# Patient Record
Sex: Female | Born: 1985 | Race: White | Hispanic: No | Smoking: Never smoker
Health system: Southern US, Community
[De-identification: ages and names within clinical notes are randomized; demographics above are authoritative.]

## PROBLEM LIST (undated history)

## (undated) DIAGNOSIS — B009 Herpesviral infection, unspecified: Secondary | ICD-10-CM

## (undated) DIAGNOSIS — M549 Dorsalgia, unspecified: Secondary | ICD-10-CM

## (undated) DIAGNOSIS — R600 Localized edema: Secondary | ICD-10-CM

## (undated) DIAGNOSIS — R413 Other amnesia: Secondary | ICD-10-CM

## (undated) DIAGNOSIS — K589 Irritable bowel syndrome without diarrhea: Secondary | ICD-10-CM

## (undated) DIAGNOSIS — E079 Disorder of thyroid, unspecified: Secondary | ICD-10-CM

## (undated) DIAGNOSIS — R519 Headache, unspecified: Secondary | ICD-10-CM

## (undated) DIAGNOSIS — Z789 Other specified health status: Secondary | ICD-10-CM

## (undated) DIAGNOSIS — K5900 Constipation, unspecified: Secondary | ICD-10-CM

## (undated) DIAGNOSIS — R51 Headache: Secondary | ICD-10-CM

## (undated) HISTORY — DX: Localized edema: R60.0

## (undated) HISTORY — DX: Dorsalgia, unspecified: M54.9

## (undated) HISTORY — DX: Other amnesia: R41.3

## (undated) HISTORY — PX: EYE SURGERY: SHX253

## (undated) HISTORY — DX: Disorder of thyroid, unspecified: E07.9

## (undated) HISTORY — DX: Irritable bowel syndrome, unspecified: K58.9

## (undated) HISTORY — DX: Constipation, unspecified: K59.00

## (undated) HISTORY — DX: Headache: R51

## (undated) HISTORY — DX: Headache, unspecified: R51.9

---

## 2011-07-10 ENCOUNTER — Other Ambulatory Visit (HOSPITAL_COMMUNITY)
Admission: RE | Admit: 2011-07-10 | Discharge: 2011-07-10 | Disposition: A | Discharge status: Home / Self Care | Payer: Medicaid Other | Source: Ambulatory Visit | Attending: Obstetrics and Gynecology | Admitting: Obstetrics and Gynecology

## 2011-07-10 ENCOUNTER — Other Ambulatory Visit: Payer: Self-pay | Admitting: Adult Health

## 2011-07-10 DIAGNOSIS — Z01419 Encounter for gynecological examination (general) (routine) without abnormal findings: Secondary | ICD-10-CM | POA: Insufficient documentation

## 2011-07-10 DIAGNOSIS — Z113 Encounter for screening for infections with a predominantly sexual mode of transmission: Secondary | ICD-10-CM | POA: Insufficient documentation

## 2011-12-03 ENCOUNTER — Inpatient Hospital Stay (HOSPITAL_COMMUNITY)
Admission: AD | Admit: 2011-12-03 | Discharge: 2011-12-03 | Disposition: A | Discharge status: Home / Self Care | Payer: Medicaid Other | Source: Ambulatory Visit | Attending: Obstetrics & Gynecology | Admitting: Obstetrics & Gynecology

## 2011-12-03 ENCOUNTER — Encounter (HOSPITAL_COMMUNITY): Payer: Self-pay | Admitting: *Deleted

## 2011-12-03 DIAGNOSIS — O285 Abnormal chromosomal and genetic finding on antenatal screening of mother: Secondary | ICD-10-CM | POA: Insufficient documentation

## 2011-12-03 DIAGNOSIS — O99891 Other specified diseases and conditions complicating pregnancy: Secondary | ICD-10-CM | POA: Insufficient documentation

## 2011-12-03 DIAGNOSIS — O0990 Supervision of high risk pregnancy, unspecified, unspecified trimester: Secondary | ICD-10-CM | POA: Insufficient documentation

## 2011-12-03 DIAGNOSIS — O09899 Supervision of other high risk pregnancies, unspecified trimester: Secondary | ICD-10-CM | POA: Insufficient documentation

## 2011-12-03 DIAGNOSIS — O26899 Other specified pregnancy related conditions, unspecified trimester: Secondary | ICD-10-CM

## 2011-12-03 DIAGNOSIS — R109 Unspecified abdominal pain: Secondary | ICD-10-CM | POA: Insufficient documentation

## 2011-12-03 DIAGNOSIS — O479 False labor, unspecified: Secondary | ICD-10-CM

## 2011-12-03 DIAGNOSIS — B009 Herpesviral infection, unspecified: Secondary | ICD-10-CM | POA: Insufficient documentation

## 2011-12-03 HISTORY — DX: Other specified health status: Z78.9

## 2011-12-03 LAB — URINALYSIS, ROUTINE W REFLEX MICROSCOPIC
Glucose, UA: NEGATIVE mg/dL
Hgb urine dipstick: NEGATIVE
Ketones, ur: NEGATIVE mg/dL
Leukocytes, UA: NEGATIVE
pH: 6 (ref 5.0–8.0)

## 2011-12-03 LAB — FETAL FIBRONECTIN: Fetal Fibronectin: POSITIVE — AB

## 2011-12-03 NOTE — Progress Notes (Signed)
Pt states," I've had pain in my low abdomen and vagina for two weeks;  mostly vagina like the baby is pushing on my bladder. It hurst to walk and standing."

## 2011-12-03 NOTE — Progress Notes (Signed)
Pt in c/o sharp, stabbing pains in lower abdomen and in vagina.  States pain has been off an on x2 weeks, progressively getting worse.  Denies any bleeding or leaking of fluid. Denies any contractions.  + FM.

## 2011-12-03 NOTE — ED Provider Notes (Signed)
History     Chief Complaint  Patient presents with  . Abdominal Pain   HPI 26 y/o F G2P1001 with 28.4 weeks presenting with abdominal pain for 2 weeks that irradiates to her vagina. No vaginal discharge, leaking of fluid or bleeding. She is Pt of [facility] and has received prenatal care including labs and U/S. She does not reports hx of prematurity in her prior pregnancy, also no medical problems that she is aware of during this pregnancy. No headache, visual changes, epigastric pain or edema. No other complaints. Labs up to this visit: O positive.                                   HIV, Serology, HBs Ag, negative.                                   Rubella/ Varicella immune.                                  MSAFP increased risk for open NTD. Harmony test negative.                                  OB U/S: 2 vesel cord. Minimal bilateral hydronephrosis noted. Abnormal u/s repeat at 28 weeks.   OB History    Grav Para Term Preterm Abortions TAB SAB Ect Mult Living   2 1 1  0 0 0 0 0 0 1      Past Medical History  Diagnosis Date  . No pertinent past medical history     Past Surgical History  Procedure Date  . No past surgeries     Family History  Problem Relation Age of Onset  . Anesthesia problems Neg Hx     History  Substance Use Topics  . Smoking status: Never Smoker   . Smokeless tobacco: Not on file  . Alcohol Use: No    Allergies: No Known Allergies  Prescriptions prior to admission  Medication Sig Dispense Refill  . Prenatal Vit-Fe Fumarate-FA (PRENATAL MULTIVITAMIN) TABS Take 1 tablet by mouth daily.        Review of Systems  Constitutional: Negative for fever.  Eyes: Negative for blurred vision.  Respiratory: Negative for cough.   Cardiovascular: Negative for chest pain.  Gastrointestinal: Positive for abdominal pain. Negative for heartburn, nausea, vomiting and diarrhea.  Genitourinary: Negative.   Musculoskeletal: Negative.   Neurological:  Negative for headaches.   Physical Exam   Blood pressure 114/68, pulse 112, temperature 98.4 F (36.9 C), temperature source Oral, resp. rate 18, height 5' 4.75" (1.645 m), weight 104.327 kg (230 lb).  Physical Exam  Constitutional: She is oriented to person, place, and time. She appears well-developed and well-nourished. No distress.  HENT:  Mouth/Throat: Oropharynx is clear and moist.  Eyes: Conjunctivae and EOM are normal. Pupils are equal, round, and reactive to light.  Neck: Neck supple.  Cardiovascular: Normal rate, regular rhythm, normal heart sounds and intact distal pulses.   No murmur heard. Respiratory: Breath sounds normal. She is in respiratory distress.  GI: Soft. Bowel sounds are normal.       Normal abdominal physical exam of a 28 gravid pt.   Genitourinary:  Speculum: Normal thin vaginal discharge. Normal cervix. Bimanual: Cervix anterior, internal OS closed, long and soft 40% effaced. Station -3-4.  Musculoskeletal: She exhibits no edema.  Neurological: She is alert and oriented to person, place, and time. She has normal reflexes.  Skin: Skin is warm and dry.    MAU Course  Procedures  MDM Fetal fibronectin: positive FHT: category 1 Toco: 1 contraction in 30 min Assessment and Plan  26 y/o F G2P1001 with 28.4 weeks presenting with abdominal pain. No patterned contractions, Closed cervix rechecked  1 h later by Katrinka Blazing, CNM and no changes found. Case discussed with Dr. Marice Potter. Recommended f/u at [facility] tomorrow. Preterm labor signs and symptoms discussed. Pt agreeable with plan.  D. [place] Sherron Flemings Paz. MD PGY-1 12/03/2011, 6:19 PM

## 2011-12-05 LAB — URINE CULTURE
Colony Count: 50000
Culture  Setup Time: 201302060141
Special Requests: NORMAL

## 2011-12-13 ENCOUNTER — Inpatient Hospital Stay (HOSPITAL_COMMUNITY)
Admission: AD | Admit: 2011-12-13 | Discharge: 2011-12-14 | DRG: 778 | Disposition: A | Discharge status: Home / Self Care | Payer: Medicaid Other | Source: Ambulatory Visit | Attending: Obstetrics & Gynecology | Admitting: Obstetrics & Gynecology

## 2011-12-13 ENCOUNTER — Encounter (HOSPITAL_COMMUNITY): Payer: Self-pay | Admitting: *Deleted

## 2011-12-13 ENCOUNTER — Inpatient Hospital Stay (HOSPITAL_COMMUNITY): Payer: Medicaid Other

## 2011-12-13 DIAGNOSIS — O4700 False labor before 37 completed weeks of gestation, unspecified trimester: Principal | ICD-10-CM | POA: Diagnosis present

## 2011-12-13 HISTORY — DX: Herpesviral infection, unspecified: B00.9

## 2011-12-13 LAB — CBC
HCT: 39.8 % (ref 36.0–46.0)
Hemoglobin: 13 g/dL (ref 12.0–15.0)
MCH: 28.5 pg (ref 26.0–34.0)
MCV: 87.3 fL (ref 78.0–100.0)
RBC: 4.56 MIL/uL (ref 3.87–5.11)
WBC: 12.7 10*3/uL — ABNORMAL HIGH (ref 4.0–10.5)

## 2011-12-13 LAB — URINALYSIS, ROUTINE W REFLEX MICROSCOPIC
Glucose, UA: NEGATIVE mg/dL
Leukocytes, UA: NEGATIVE
Nitrite: NEGATIVE
Protein, ur: NEGATIVE mg/dL
Urobilinogen, UA: 0.2 mg/dL (ref 0.0–1.0)

## 2011-12-13 MED ORDER — IBUPROFEN 600 MG PO TABS
600.0000 mg | ORAL_TABLET | Freq: Once | ORAL | Status: AC
  Administered 2011-12-13: 600 mg via ORAL
  Filled 2011-12-13: qty 1

## 2011-12-13 MED ORDER — ZOLPIDEM TARTRATE 10 MG PO TABS: 10.0000 mg | ORAL_TABLET | Freq: Every evening | ORAL | Status: DC | PRN

## 2011-12-13 MED ORDER — MAGNESIUM SULFATE 40 G IN LACTATED RINGERS - SIMPLE
2.0000 g/h | INTRAVENOUS | Status: DC
  Administered 2011-12-13: 2 g/h via INTRAVENOUS
  Filled 2011-12-13: qty 500

## 2011-12-13 MED ORDER — ACETAMINOPHEN 325 MG PO TABS: 650.0000 mg | ORAL_TABLET | ORAL | Status: DC | PRN

## 2011-12-13 MED ORDER — MAGNESIUM SULFATE BOLUS VIA INFUSION
4.0000 g | Freq: Once | INTRAVENOUS | Status: AC
  Administered 2011-12-13: 4 g via INTRAVENOUS
  Filled 2011-12-13: qty 500

## 2011-12-13 MED ORDER — DOCUSATE SODIUM 100 MG PO CAPS
100.0000 mg | ORAL_CAPSULE | Freq: Every day | ORAL | Status: DC
  Administered 2011-12-14: 100 mg via ORAL
  Filled 2011-12-13 (×2): qty 1

## 2011-12-13 MED ORDER — PRENATAL MULTIVITAMIN CH
1.0000 | ORAL_TABLET | Freq: Every day | ORAL | Status: DC
  Administered 2011-12-14: 1 via ORAL

## 2011-12-13 MED ORDER — CALCIUM CARBONATE ANTACID 500 MG PO CHEW: 2.0000 | CHEWABLE_TABLET | ORAL | Status: DC | PRN

## 2011-12-13 MED ORDER — LACTATED RINGERS IV SOLN
INTRAVENOUS | Status: DC
  Administered 2011-12-13: 20 mL/h via INTRAVENOUS
  Administered 2011-12-14: 06:00:00 via INTRAVENOUS

## 2011-12-13 NOTE — ED Notes (Signed)
States received steroid injections from office, 2/6 and 2/7.

## 2011-12-13 NOTE — Progress Notes (Signed)
Pt states, " I've had contractions since 8 am. I saw Dr. Emelda Fear at 10 am, and he gave me procardia. I took 20 mg at 1256 and 10 mg at 1356. They have calmed down some, but I'm still having them."

## 2011-12-13 NOTE — ED Provider Notes (Signed)
History     Chief Complaint  Patient presents with  . Contractions   HPI Pt is a 26 yo G44P1001 female who presents for contractions. Pt received steroids last week at Dr. Rayna Sexton office.  Pt seen today at [facility] by Dr. Emelda Fear.  Cervix was closed and long.  Pt was given procardia 10mg  which she has taken but still feels cramping.  No LOF, VB.  Good FM.  Pt's U cx from admission showed multiple species and is being recollected today for culture.   Past Medical History  Diagnosis Date  . Herpes     Past Surgical History  Procedure Date  . No past surgeries     Family History  Problem Relation Age of Onset  . Anesthesia problems Neg Hx     History  Substance Use Topics  . Smoking status: Never Smoker   . Smokeless tobacco: Never Used  . Alcohol Use: No    Allergies: No Known Allergies  Prescriptions prior to admission  Medication Sig Dispense Refill  . NIFEdipine (PROCARDIA) 10 MG capsule Take 10 mg by mouth 3 (three) times daily.      . Prenatal Vit-Fe Fumarate-FA (PRENATAL MULTIVITAMIN) TABS Take 1 tablet by mouth daily.        Review of Systems  Constitutional: Negative.   Gastrointestinal: Positive for abdominal pain.  Genitourinary: Negative for dysuria, urgency and frequency.  Neurological: Positive for headaches.   Physical Exam   Blood pressure 107/71, pulse 112, temperature 97 F (36.1 C), temperature source Oral, resp. rate 18, height 5' 5.25" (1.657 m), weight 104.497 kg (230 lb 6 oz).  Physical Exam  Vitals reviewed. Constitutional: She is oriented to person, place, and time. She appears well-developed and well-nourished. No distress.  HENT:  Head: Normocephalic and atraumatic.  Eyes: Conjunctivae are normal.  Respiratory: Effort normal.  GI: Soft. She exhibits no distension. There is no tenderness. There is no rebound and no guarding.  Genitourinary: Guaiac stool: Cerviacl internal os is closed/ external os is 1 cm.  Cervix is long with  tone.  -3 station.  Neurological: She is oriented to person, place, and time.  Skin: Skin is warm and dry.  Psychiatric: She has a normal mood and affect.    MAU Course  Procedures NST reactive.  Baseline 155 to 160.   Assessment and Plan  25  G2P1001 at 30 weeks and 0 days with preterm contractions.  Pt made cervical change during the 1 1/2 hours she was here.  She is now 1 and long.  She is having contractions every 5 minutes.  Will admit for tocolysis and observation.  Pt has history of 2 vessel cord so will get growth [place].  Will treat migraine with ibuprofen / flexeril.    Leah Schneider H. 12/13/2011, 5:56 PM

## 2011-12-13 NOTE — ED Notes (Signed)
States UC's have decreased since Procardia.

## 2011-12-13 NOTE — Progress Notes (Signed)
In to start IV and discuss going to u/s and then to antenatal. Significant other at bedside.

## 2011-12-13 NOTE — ED Notes (Signed)
Kim RN in antenatal given report of pt's admission and status. Will go to u/s first and then to antenatal

## 2011-12-13 NOTE — Progress Notes (Signed)
To u/s via w/c and then 155

## 2011-12-13 NOTE — Progress Notes (Signed)
To u/s via w/c and then to 155

## 2011-12-13 NOTE — H&P (Signed)
  Chief Complaint   Patient presents with   .  Contractions    HPI  Pt is a 26 yo G47P1001 female who presents for contractions. Pt received steroids last week at Dr. Rayna Sexton office. Pt seen today at [facility] by Dr. Emelda Fear. Cervix was closed and long. Pt was given procardia 10mg  which she has taken but still feels cramping. No LOF, VB. Good FM. Pt's U cx from admission showed multiple species and is being recollected today for culture.  Past Medical History   Diagnosis  Date   .  Herpes     Past Surgical History   Procedure  Date   .  No past surgeries     Family History   Problem  Relation  Age of Onset   .  Anesthesia problems  Neg Hx     History   Substance Use Topics   .  Smoking status:  Never Smoker   .  Smokeless tobacco:  Never Used   .  Alcohol Use:  No    Allergies: No Known Allergies  Prescriptions prior to admission   Medication  Sig  Dispense  Refill   .  NIFEdipine (PROCARDIA) 10 MG capsule  Take 10 mg by mouth 3 (three) times daily.     .  Prenatal Vit-Fe Fumarate-FA (PRENATAL MULTIVITAMIN) TABS  Take 1 tablet by mouth daily.      Review of Systems  Constitutional: Negative.  Gastrointestinal: Positive for abdominal pain.  Genitourinary: Negative for dysuria, urgency and frequency.  Neurological: Positive for headaches.   Physical Exam   Blood pressure 107/71, pulse 112, temperature 97 F (36.1 C), temperature source Oral, resp. rate 18, height 5' 5.25" (1.657 m), weight 104.497 kg (230 lb 6 oz).  Physical Exam  Vitals reviewed.  Constitutional: She is oriented to person, place, and time. She appears well-developed and well-nourished. No distress.  HENT:  Head: Normocephalic and atraumatic.  Eyes: Conjunctivae are normal.  Respiratory: Effort normal.  GI: Soft. She exhibits no distension. There is no tenderness. There is no rebound and no guarding.  Genitourinary: Guaiac stool: Cerviacl internal os is closed/ external os is 1 cm. Cervix is long  with tone. -3 station.  Neurological: She is oriented to person, place, and time.  Skin: Skin is warm and dry.  Psychiatric: She has a normal mood and affect.   MAU Course   Procedures  NST reactive. Baseline 155 to 160.  Assessment and Plan   25 G2P1001 at 30 weeks and 0 days with preterm contractions. Pt made cervical change during the 1 1/2 hours she was here. She is now 1 and long. She is having contractions every 5 minutes. Will admit for tocolysis and observation. Pt has history of 2 vessel cord so will get growth [place]. Will treat migraine with ibuprofen / flexeril.  Tell Rozelle H.  12/13/2011, 5:56 PM

## 2011-12-13 NOTE — Progress Notes (Signed)
Laughing and joking with significant other

## 2011-12-14 ENCOUNTER — Inpatient Hospital Stay (HOSPITAL_COMMUNITY)
Admission: AD | Admit: 2011-12-14 | Discharge: 2011-12-22 | DRG: 765 | Disposition: A | Discharge status: Home / Self Care | Payer: Medicaid Other | Source: Ambulatory Visit | Attending: Obstetrics & Gynecology | Admitting: Obstetrics & Gynecology

## 2011-12-14 ENCOUNTER — Encounter (HOSPITAL_COMMUNITY): Payer: Self-pay | Admitting: *Deleted

## 2011-12-14 DIAGNOSIS — A6000 Herpesviral infection of urogenital system, unspecified: Secondary | ICD-10-CM | POA: Diagnosis present

## 2011-12-14 DIAGNOSIS — O321XX0 Maternal care for breech presentation, not applicable or unspecified: Secondary | ICD-10-CM | POA: Diagnosis present

## 2011-12-14 DIAGNOSIS — O0990 Supervision of high risk pregnancy, unspecified, unspecified trimester: Secondary | ICD-10-CM

## 2011-12-14 DIAGNOSIS — O365990 Maternal care for other known or suspected poor fetal growth, unspecified trimester, not applicable or unspecified: Secondary | ICD-10-CM | POA: Diagnosis present

## 2011-12-14 DIAGNOSIS — O42919 Preterm premature rupture of membranes, unspecified as to length of time between rupture and onset of labor, unspecified trimester: Secondary | ICD-10-CM | POA: Diagnosis present

## 2011-12-14 DIAGNOSIS — O411090 Infection of amniotic sac and membranes, unspecified, unspecified trimester, not applicable or unspecified: Secondary | ICD-10-CM | POA: Diagnosis present

## 2011-12-14 DIAGNOSIS — O4290 Premature rupture of membranes, unspecified as to length of time between rupture and onset of labor, unspecified weeks of gestation: Principal | ICD-10-CM | POA: Diagnosis present

## 2011-12-14 DIAGNOSIS — O283 Abnormal ultrasonic finding on antenatal screening of mother: Secondary | ICD-10-CM | POA: Diagnosis present

## 2011-12-14 DIAGNOSIS — Z98891 History of uterine scar from previous surgery: Secondary | ICD-10-CM

## 2011-12-14 DIAGNOSIS — O98519 Other viral diseases complicating pregnancy, unspecified trimester: Secondary | ICD-10-CM | POA: Diagnosis present

## 2011-12-14 MED ORDER — NIFEDIPINE ER 30 MG PO TB24
30.0000 mg | ORAL_TABLET | Freq: Two times a day (BID) | ORAL | Status: DC
  Administered 2011-12-14 (×2): 30 mg via ORAL
  Filled 2011-12-14 (×3): qty 1

## 2011-12-14 MED ORDER — NIFEDIPINE ER 30 MG PO TB24: 30.0000 mg | ORAL_TABLET | Freq: Two times a day (BID) | ORAL | Status: DC

## 2011-12-14 MED ORDER — IBUPROFEN 600 MG PO TABS
600.0000 mg | ORAL_TABLET | Freq: Once | ORAL | Status: AC
  Administered 2011-12-14: 600 mg via ORAL
  Filled 2011-12-14: qty 1

## 2011-12-14 NOTE — Plan of Care (Deleted)
Problem: Consults Goal: Birthing Suites Patient Information Press F2 to bring up selections list  Outcome: Not Applicable Date Met:  12/14/11  Pt < [redacted] weeks EGA

## 2011-12-14 NOTE — Discharge Summary (Signed)
Patient was admitted for observation for concern about PTL.  No cervical change.  Discharged to home on Procardia.  Will follow up at [facility].  Attestation of Attending Supervision of Advanced Practitioner: Evaluation and management procedures were performed by the PA/NP/CNM/OB Fellow under my supervision/collaboration. Chart reviewed, and agree with management and plan.  Jaynie Collins, M.D. 12/14/2011 6:03 PM

## 2011-12-14 NOTE — Progress Notes (Signed)
Patient ID: Leah Schneider, female   DOB: 21-Jun-1986, 26 y.o.   MRN: [id number]  FACULTY PRACTICE ANTEPARTUM(COMPREHENSIVE) NOTE  Leah Schneider is a 26 y.o. G4P1021 at [redacted]w[redacted]d  who is admitted for Preterm labor.   Length of Stay:  1  Days  Subjective: Pt's headache improved on ibuprofen.  Pt can have another dose today if headache returns Patient reports the fetal movement as active. Patient reports uterine contraction  activity as none. Patient reports  vaginal bleeding as none. Patient describes fluid per vagina as None.  Vitals:  Blood pressure 119/76, pulse 93, temperature 98.1 F (36.7 C), temperature source Oral, resp. rate 20, height 5' 5.25" (1.657 m), weight 104.497 kg (230 lb 6 oz), last menstrual period 05/17/2011. Physical Examination:  General appearance - alert, well appearing, and in no distress Abdomen - soft, nontender, nondistended, no masses or organomegaly Pelvic - 1 cm/ long/high Extremities - no edema, redness or tenderness in the calves or thighs  Fetal Monitoring:  Baseline: 150 bpm, Variability: Good {> 6 bpm), Accelerations: 10 x 10 and Decelerations: Absent  Labs:  Recent Results (from the past 24 hour(s))  URINALYSIS, ROUTINE W REFLEX MICROSCOPIC   Collection Time   12/13/11  5:12 PM      Component Value Range   Color, Urine YELLOW  YELLOW    APPearance CLEAR  CLEAR    Specific Gravity, Urine 1.015  1.005 - 1.030    pH 6.5  5.0 - 8.0    Glucose, UA NEGATIVE  NEGATIVE (mg/dL)   Hgb urine dipstick NEGATIVE  NEGATIVE    Bilirubin Urine NEGATIVE  NEGATIVE    Ketones, ur NEGATIVE  NEGATIVE (mg/dL)   Protein, ur NEGATIVE  NEGATIVE (mg/dL)   Urobilinogen, UA 0.2  0.0 - 1.0 (mg/dL)   Nitrite NEGATIVE  NEGATIVE    Leukocytes, UA NEGATIVE  NEGATIVE   CBC   Collection Time   12/13/11  7:15 PM      Component Value Range   WBC 12.7 (*) 4.0 - 10.5 (K/uL)   RBC 4.56  3.87 - 5.11 (MIL/uL)   Hemoglobin 13.0  12.0 - 15.0 (g/dL)   HCT 40.9  81.1 - 91.4 (%)   MCV  87.3  78.0 - 100.0 (fL)   MCH 28.5  26.0 - 34.0 (pg)   MCHC 32.7  30.0 - 36.0 (g/dL)   RDW 78.2  95.6 - 21.3 (%)   Platelets 206  150 - 400 (K/uL)    Imaging Studies:    2 vessel cord, pyelectasis resolved, 7 x 4 cm soft tissue mass on unilateral or both lower extremities.  Growth 19%  Medications:  Scheduled    . docusate sodium  100 mg Oral Daily  . ibuprofen  600 mg Oral Once  . magnesium  4 g Intravenous Once  . prenatal multivitamin  1 tablet Oral Daily   I have reviewed the patient's current medications.  ASSESSMENT: Patient Active Problem List  Diagnoses  . Abnormal genetic test in pregnancy  . Two vessel umbilical cord, antepartum  . Pregnancy, supervision of, high-risk  . HSV-2 infection complicating pregnancy    PLAN: 26 yo G2P1001 at 30 weeks one day with preterm contractions.  Contractions now resolved.  Will d/c Magnesium sulfate and start procardia (pt s/p beta earlier this month).  Soft tissue limb mass--MFM consult; can also address NTD 1:18  Headache--can have limited motrin prn until 32 weeks.  Suggest appt with Janee Morn at [facility] for headaches.  Compare  anatomy scan at [facility].  Tayden Nichelson H. 12/14/2011,7:31 AM

## 2011-12-14 NOTE — Discharge Summary (Signed)
  Obstetric Discharge Summary Reason for Admission: preterm labor Prenatal Procedures: ultrasound Intrapartum Procedures: n/a Postpartum Procedures: n/a Complications-Operative and Postpartum: none   This patient has no babies on file.  H/H: Lab Results  Component Value Date/Time   HGB 13.0 12/13/2011  7:15 PM   HCT 39.8 12/13/2011  7:15 PM      Discharge Diagnoses: Premature labor  Discharge Information: Date: 05/09/2011 Activity: pelvic rest; out of work until further notice Diet: routine  Medications: Procardia XL 30mg  BID Breast feeding:  No: n/a Condition: stable Instructions: refer to practice specific booklet Discharge to: home F/U Tuesday as scheduled.  Will set up appt for MFM consult that day.   Medication List  As of 12/14/2011  4:53 PM   STOP taking these medications         NIFEdipine 10 MG capsule         TAKE these medications         NIFEdipine 30 MG 24 hr tablet   Commonly known as: PROCARDIA-XL/ADALAT CC   Take 1 tablet (30 mg total) by mouth 2 (two) times daily.      prenatal multivitamin Tabs   Take 1 tablet by mouth daily.             CRESENZO-DISHMAN,Axtyn Woehler 12/14/2011,4:53 PM

## 2011-12-14 NOTE — Discharge Instructions (Signed)

## 2011-12-15 ENCOUNTER — Inpatient Hospital Stay (HOSPITAL_COMMUNITY): Payer: Medicaid Other

## 2011-12-15 ENCOUNTER — Encounter (HOSPITAL_COMMUNITY): Payer: Self-pay | Admitting: *Deleted

## 2011-12-15 DIAGNOSIS — O0990 Supervision of high risk pregnancy, unspecified, unspecified trimester: Secondary | ICD-10-CM

## 2011-12-15 DIAGNOSIS — O283 Abnormal ultrasonic finding on antenatal screening of mother: Secondary | ICD-10-CM | POA: Diagnosis present

## 2011-12-15 DIAGNOSIS — O365990 Maternal care for other known or suspected poor fetal growth, unspecified trimester, not applicable or unspecified: Secondary | ICD-10-CM | POA: Diagnosis present

## 2011-12-15 LAB — RAPID URINE DRUG SCREEN, HOSP PERFORMED
Amphetamines: NOT DETECTED
Opiates: NOT DETECTED

## 2011-12-15 LAB — URINE CULTURE: Colony Count: 50000

## 2011-12-15 LAB — CBC
HCT: 39.1 % (ref 36.0–46.0)
Hemoglobin: 12.6 g/dL (ref 12.0–15.0)
MCHC: 32.2 g/dL (ref 30.0–36.0)
RDW: 13.2 % (ref 11.5–15.5)
WBC: 11.2 10*3/uL — ABNORMAL HIGH (ref 4.0–10.5)

## 2011-12-15 MED ORDER — LACTATED RINGERS IV SOLN
INTRAVENOUS | Status: DC
  Administered 2011-12-15 – 2011-12-19 (×5): via INTRAVENOUS
  Administered 2011-12-19: 999 mL/h via INTRAVENOUS
  Administered 2011-12-19: 08:00:00 via INTRAVENOUS

## 2011-12-15 MED ORDER — AMOXICILLIN 500 MG PO CAPS
500.0000 mg | ORAL_CAPSULE | Freq: Three times a day (TID) | ORAL | Status: DC
  Administered 2011-12-17 – 2011-12-19 (×7): 500 mg via ORAL
  Filled 2011-12-15 (×8): qty 1

## 2011-12-15 MED ORDER — ACETAMINOPHEN 325 MG PO TABS: 650.0000 mg | ORAL_TABLET | ORAL | Status: DC | PRN

## 2011-12-15 MED ORDER — SODIUM CHLORIDE 0.9 % IV SOLN
2.0000 g | Freq: Four times a day (QID) | INTRAVENOUS | Status: AC
  Administered 2011-12-15 – 2011-12-16 (×8): 2 g via INTRAVENOUS
  Filled 2011-12-15 (×8): qty 2000

## 2011-12-15 MED ORDER — CALCIUM CARBONATE ANTACID 500 MG PO CHEW: 2.0000 | CHEWABLE_TABLET | ORAL | Status: DC | PRN

## 2011-12-15 MED ORDER — PRENATAL MULTIVITAMIN CH
1.0000 | ORAL_TABLET | Freq: Every day | ORAL | Status: DC
  Administered 2011-12-15 – 2011-12-18 (×4): 1 via ORAL
  Filled 2011-12-15 (×4): qty 1

## 2011-12-15 MED ORDER — ZOLPIDEM TARTRATE 10 MG PO TABS: 10.0000 mg | ORAL_TABLET | Freq: Every evening | ORAL | Status: DC | PRN

## 2011-12-15 MED ORDER — DOCUSATE SODIUM 100 MG PO CAPS
100.0000 mg | ORAL_CAPSULE | Freq: Every day | ORAL | Status: DC
  Administered 2011-12-15 – 2011-12-18 (×3): 100 mg via ORAL
  Filled 2011-12-15 (×3): qty 1

## 2011-12-15 MED ORDER — VALACYCLOVIR HCL 500 MG PO TABS
500.0000 mg | ORAL_TABLET | Freq: Two times a day (BID) | ORAL | Status: DC
  Administered 2011-12-15 – 2011-12-18 (×8): 500 mg via ORAL
  Filled 2011-12-15 (×9): qty 1

## 2011-12-15 MED ORDER — ERYTHROMYCIN BASE 250 MG PO TABS
250.0000 mg | ORAL_TABLET | Freq: Four times a day (QID) | ORAL | Status: DC
  Administered 2011-12-17 – 2011-12-19 (×9): 250 mg via ORAL
  Filled 2011-12-15 (×10): qty 1

## 2011-12-15 MED ORDER — SODIUM CHLORIDE 0.9 % IV SOLN
250.0000 mg | Freq: Four times a day (QID) | INTRAVENOUS | Status: AC
  Administered 2011-12-15 – 2011-12-16 (×8): 250 mg via INTRAVENOUS
  Filled 2011-12-15 (×8): qty 250

## 2011-12-15 NOTE — H&P (Signed)
Attestation of Attending Supervision of Advanced Practitioner: Evaluation and management procedures were performed by the PA/NP/CNM/OB Fellow under my supervision/collaboration. Chart reviewed, and agree with management and plan.  Jaynie Collins, M.D. 12/15/2011 7:08 AM

## 2011-12-15 NOTE — Consult Note (Signed)
[facility]  Neonatal Medicine Consultation       12/15/2011    9:04 PM  I was called at the request of the patient's obstetrician (Dr. Penne Lash) to speak to this patient due to premature ROM at 30.2 weeks.  Mom admitted last night.  She received a course of betamethasone 1-2 weeks ago.  The baby is breech.  Ultrasound showed a suspicious lesion near the cervical os and near the fetus's foot--the lesion appeared mobile and separate from the baby.  Radiologist suspects this is a clot, consistent with the bloody fluid mom has been passing.  I met with the patient and her husband, and reviewed expectations for premature babies born at 30-35 weeks.  I covered respiratory distress, infection, feeding issues, intracranial bleeding, retinopathy, length of stay, survival.  I answered their questions.   _____________________ Electronically Signed By: Angelita Ingles, MD Neonatologist

## 2011-12-15 NOTE — H&P (Signed)
Leah Schneider is a 26 y.o. U9W1191 at [redacted]w[redacted]d admitted for PPROM.   1 Fetal presentation is footling breech.  History of Present Illness: Ms. Leah Schneider was discharge home this evening after an overnight stay to R/O PTL.  Her cervix changed slightly (closed>1cm) while in the MAU  12/13/11, and the pt was contracting despite oral short acting Procardia.  She was placed on MgS04 for 12 hours, converted successfully to Extended release Procardia 30mg , and discharged home . (She had already received BMZ 10 days ago d/t a + fFn).  She was lying in bed and "felt blood gushing out".  She brought her panties in a bag and they are, indeed, wet with what appears to be blood tinged amniotic fluid.  Ms. Leah Schneider does not c/o any pain or contractions. Patient reports the fetal movement as active. Patient reports uterine contraction  activity as none. Patient reports  vaginal bleeding as less flow than a normal period. Patient describes fluid per vagina as Other blood tinged.  Patient Active Problem List  Diagnoses  . Abnormal genetic test in pregnancy  . Two vessel umbilical cord, antepartum  . Pregnancy, supervision of, high-risk  . HSV-2 infection complicating pregnancy   Past Medical History: Past Medical History  Diagnosis Date  . Herpes   . No pertinent past medical history     Past Surgical History: Past Surgical History  Procedure Date  . No past surgeries     Obstetrical History: OB History    Grav Para Term Preterm Abortions TAB SAB Ect Mult Living   4 1 1  0 2 0 2 0 0 1      Gynecological History: negative  Social History: History   Social History  . Marital Status: Married    Spouse Name: N/A    Number of Children: N/A  . Years of Education: N/A   Social History Main Topics  . Smoking status: Never Smoker   . Smokeless tobacco: Never Used  . Alcohol Use: No  . Drug Use: No  . Sexually Active: Yes    Birth Control/ Protection: None   Other Topics Concern  . Not on file    Social History Narrative  . No narrative on file    Family History: Family History  Problem Relation Age of Onset  . Anesthesia problems Neg Hx     Allergies: No Known Allergies  Prescriptions prior to admission  Medication Sig Dispense Refill  . NIFEdipine (PROCARDIA-XL/ADALAT CC) 30 MG 24 hr tablet Take 30 mg by mouth 2 (two) times daily.      . Prenatal Vit-Fe Fumarate-FA (PRENATAL MULTIVITAMIN) TABS Take 1 tablet by mouth daily.      Marland Kitchen DISCONTD: NIFEdipine (PROCARDIA-XL/ADALAT CC) 30 MG 24 hr tablet Take 1 tablet (30 mg total) by mouth 2 (two) times daily.  30 tablet  2    Review of Systems - Negative except aforementioned problems  Vitals:  Blood pressure 139/86, pulse 95, temperature 98.2 F (36.8 C), temperature source Oral, last menstrual period 05/17/2011. Physical Examination:  General appearance - alert, well appearing, and in no distress Chest - clear to auscultation, no wheezes, rales or rhonchi, symmetric air entry Heart - normal rate and regular rhythm Abdomen - soft, nontender, nondistended, no masses or organomegaly Pelvic - SSE;  Blood tinged watery fluid. Difficult to assess cervix due to lax vaginal walls.  Amnisure + Extremities - no pedal edema noted Abdomen: gravid and fundal height  is size equals dates Cervix: Evaluated by  sterile speculum exam but difficult to see due to vaginal sidewalls Membranes:ruptured, blood tinged fluid Fetal Monitoring:Baseline: 140 bpm, Variability: Good {> 6 bpm) and Accelerations: Reactive   Labs:  Recent Results (from the past 24 hour(s))  AMNISURE RUPTURE OF MEMBRANE (ROM)   Collection Time   12/15/11 12:13 AM      Component Value Range   Amnisure ROM POSITIVE      Imaging Studies: Footling breech; CX 4cms long, appears closed; previously identified foot/feet mass is possibly an old blood clot;l AFI 12cms     Prior to Admission:  Prescriptions prior to admission  Medication Sig Dispense Refill  .  NIFEdipine (PROCARDIA-XL/ADALAT CC) 30 MG 24 hr tablet Take 30 mg by mouth 2 (two) times daily.      . Prenatal Vit-Fe Fumarate-FA (PRENATAL MULTIVITAMIN) TABS Take 1 tablet by mouth daily.      Marland Kitchen DISCONTD: NIFEdipine (PROCARDIA-XL/ADALAT CC) 30 MG 24 hr tablet Take 1 tablet (30 mg total) by mouth 2 (two) times daily.  30 tablet  2     ASSESSMENT:  PPROM, ? Abruption vs bloody show Patient Active Problem List  Diagnoses  . Abnormal genetic test in pregnancy  . Two vessel umbilical cord, antepartum  . Pregnancy, supervision of, high-risk  . HSV-2 infection complicating pregnancy    PLAN:Cultures, antibiotics for prolonging latency period;  Pt has already received BMZ and CP chemoprophylaxis  Plan of care discussed with Dr.  Macon Large

## 2011-12-15 NOTE — Progress Notes (Signed)
FACULTY PRACTICE ANTEPARTUM(COMPREHENSIVE) NOTE  Leah Schneider is a 26 y.o. [id number] at [redacted]w[redacted]d  who is admitted for PPROM that occurred on 12/14/11.   Fetal presentation is footling breech. Length of Stay:  1  Days  Subjective: Patient reports good fetal movement.  She reports rare uterine contractions. Continues to have loss of bloody-tinged fluid per vagina.  Vitals:  Blood pressure 113/81, pulse 79, temperature 98.3 F (36.8 C), temperature source Oral, resp. rate 20, height 5' 5.25" (1.657 m), weight 104.599 kg (230 lb 9.6 oz), last menstrual period 05/17/2011. Physical Examination: General appearance - alert, well appearing, and in no distress Abdomen - soft, nontender, nondistended, gravid Extremities - peripheral pulses normal, no pedal edema, no clubbing or cyanosis Fundal Height:  size equals dates Pelvic Exam:  deferred Membranes:ruptured with blood-stained fluid  Fetal Monitoring:  Baseline: 150 bpm, Variability: moderate, Accelerations: Reactive and Decelerations: Absent Tocometer: Rare contractions  Labs:  Recent Results (from the past 24 hour(s))  AMNISURE RUPTURE OF MEMBRANE (ROM)   Collection Time   12/15/11 12:13 AM      Component Value Range   Amnisure ROM POSITIVE    CBC   Collection Time   12/15/11  3:20 AM      Component Value Range   WBC 11.2 (*) 4.0 - 10.5 (K/uL)   RBC 4.40  3.87 - 5.11 (MIL/uL)   Hemoglobin 12.6  12.0 - 15.0 (g/dL)   HCT 14.7  82.9 - 56.2 (%)   MCV 88.9  78.0 - 100.0 (fL)   MCH 28.6  26.0 - 34.0 (pg)   MCHC 32.2  30.0 - 36.0 (g/dL)   RDW 13.0  86.5 - 78.4 (%)   Platelets 185  150 - 400 (K/uL)   Imaging Studies:    Final read of obstetric ultrasound done 12/14/11 pending; but AFI was 12 on preliminary read (compared to 18 on 12/13/11) 12/13/11 EFW 1173g/19%, 2VC  Medications:  Scheduled    . ampicillin (OMNIPEN) IV  2 g Intravenous Q6H   Followed by  . amoxicillin  500 mg Oral Q8H  . docusate sodium  100 mg Oral Daily  .  erythromycin  250 mg Intravenous Q6H   Followed by  . erythromycin  250 mg Oral Q6H  . prenatal multivitamin  1 tablet Oral Daily  . valACYclovir  500 mg Oral BID   I have reviewed the patient's current medications.  ASSESSMENT: Patient Active Problem List  Diagnoses  . Abnormal genetic test in pregnancy  . Two vessel umbilical cord, antepartum  . Pregnancy, supervision of, high-risk  . HSV-2 infection complicating pregnancy  . Preterm premature rupture of membranes (PPROM) delivered, current hospitalization  . Abnormal fetal ultrasound  . Small for dates affecting management of mother   PLAN: No signs/symptoms of chorioamnionitis, continue latency antibiotics.  Started on Valtrex for HSV suppression. S/p BMZ 2/6 and 2/7 and Magnesium sulfate 2/15, no signs/symptoms impending PTL, will continue to observe Follow up ultrasound read regarding mass around foot/floating blood clot. If still concerning, may need MFM consult/imaging Repeat growth scan in 2-3 weeks for EFW 19% Continue routine antenatal care   Essie Lagunes A 12/15/2011,7:38 AM

## 2011-12-16 ENCOUNTER — Inpatient Hospital Stay (HOSPITAL_COMMUNITY): Payer: Medicaid Other

## 2011-12-16 DIAGNOSIS — O4290 Premature rupture of membranes, unspecified as to length of time between rupture and onset of labor, unspecified weeks of gestation: Secondary | ICD-10-CM

## 2011-12-16 LAB — GC/CHLAMYDIA PROBE AMP, URINE
Chlamydia, Swab/Urine, PCR: NEGATIVE
GC Probe Amp, Urine: NEGATIVE

## 2011-12-16 NOTE — Progress Notes (Deleted)
UR chart review completed.  

## 2011-12-16 NOTE — Progress Notes (Signed)
UR chart review completed.  

## 2011-12-16 NOTE — Progress Notes (Signed)
FACULTY PRACTICE ANTEPARTUM(COMPREHENSIVE) NOTE  Leah Schneider is a 26 y.o. [id number] at [redacted]w[redacted]d who is admitted for PPROM that occurred on 12/14/11.  Fetal presentation is footling breech.  Length of Stay: 2 Days  Subjective:  Patient reports good fetal movement. She reports rare uterine contractions. Continues to have loss of bloody-tinged fluid per vagina. She denies constipation or other complaints. Denies a sore abdomen. Vitals: Blood pressure 113/81, pulse 79, temperature 98.3 F (36.8 C), temperature source Oral, resp. rate 20, height 5' 5.25" (1.657 m), weight 104.599 kg (230 lb 9.6 oz), last menstrual period 05/17/2011.  Physical Examination:  General appearance - alert, well appearing, and in no distress  Abdomen - soft, nontender, nondistended, gravid  Extremities - peripheral pulses normal, no pedal edema, no clubbing or cyanosis  Fundal Height: size equals dates, non-tender abdomen Pelvic Exam: deferred  Membranes:ruptured with blood-stained fluid  Fetal Monitoring: Baseline: 150 bpm, Variability: moderate, Accelerations: Reactive and Decelerations: Absent  Tocometer: Rare contractions  Labs:  Recent Results (from the past 24 hour(s))   AMNISURE RUPTURE OF MEMBRANE (ROM)    Collection Time    12/15/11 12:13 AM   Component  Value  Range    Amnisure ROM  POSITIVE    CBC    Collection Time    12/15/11 3:20 AM   Component  Value  Range    WBC  11.2 (*)  4.0 - 10.5 (K/uL)    RBC  4.40  3.87 - 5.11 (MIL/uL)    Hemoglobin  12.6  12.0 - 15.0 (g/dL)    HCT  29.5  28.4 - 13.2 (%)    MCV  88.9  78.0 - 100.0 (fL)    MCH  28.6  26.0 - 34.0 (pg)    MCHC  32.2  30.0 - 36.0 (g/dL)    RDW  44.0  10.2 - 72.5 (%)    Platelets  185  150 - 400 (K/uL)    Imaging Studies:  Final read of obstetric ultrasound done 12/14/11 pending; but AFI was 12 on preliminary read (compared to 18 on 12/13/11)  12/13/11 EFW 1173g/19%, 2VC  Medications: Scheduled   .  ampicillin (OMNIPEN) IV  2 g  Intravenous   Q6H    Followed by   .  amoxicillin  500 mg  Oral  Q8H   .  docusate sodium  100 mg  Oral  Daily   .  erythromycin  250 mg  Intravenous  Q6H    Followed by   .  erythromycin  250 mg  Oral  Q6H   .  prenatal multivitamin  1 tablet  Oral  Daily   .  valACYclovir  500 mg  Oral  BID    I have reviewed the patient's current medications.  ASSESSMENT:  Patient Active Problem List   Diagnoses   .  Abnormal genetic test in pregnancy   .  Two vessel umbilical cord, antepartum   .  Pregnancy, supervision of, high-risk   .  HSV-2 infection complicating pregnancy   .  Preterm premature rupture of membranes (PPROM) delivered, current hospitalization   .  Abnormal fetal ultrasound   .  Small for dates affecting management of mother    PLAN:  No signs/symptoms of chorioamnionitis, continue latency antibiotics. Started on Valtrex for HSV suppression.  S/p BMZ 2/6 and 2/7 and Magnesium sulfate 2/15, no signs/symptoms impending PTL, will continue to observe  Follow up ultrasound read regarding mass around foot/floating blood clot.  MFM consult today.  Repeat growth scan in 2-3 weeks for EFW 19%  Continue routine antenatal care

## 2011-12-17 ENCOUNTER — Encounter (HOSPITAL_COMMUNITY): Payer: Self-pay | Admitting: *Deleted

## 2011-12-17 LAB — CULTURE, BETA STREP (GROUP B ONLY)

## 2011-12-17 NOTE — Progress Notes (Signed)
Patient ID: Leah Schneider, female   DOB: May 26, 1986, 26 y.o.   MRN: [id number] FACULTY PRACTICE ANTEPARTUM(COMPREHENSIVE) NOTE  Kadasia Kassing is a 26 y.o. G4P1021 at [redacted]w[redacted]d by best clinical estimate who is admitted for PROM.   Fetal presentation is breech. Length of Stay:  3  Days  Subjective: Feels well. Patient reports the fetal movement as active. Patient reports uterine contraction  activity as none. Patient reports  vaginal bleeding as slightly bloody fluid.. Patient describes fluid per vagina as Other bloody.  Vitals:  Blood pressure 114/59, pulse 89, temperature 98.4 F (36.9 C), temperature source Oral, resp. rate 20, height 5' 5.25" (1.657 m), weight 104.599 kg (230 lb 9.6 oz), last menstrual period 05/17/2011. Physical Examination:  General appearance - alert, well appearing, and in no distress Abdomen - soft, nontender, nondistended, no masses or organomegaly Fundal Height:  size equals dates Pelvic Exam:  normal external genitalia, vulva, vagina, cervix, uterus and adnexa Cervical Exam: Not evaluated.  Extremities: extremities normal, atraumatic, no cyanosis or edema  Membranes:ruptured  Fetal Monitoring:  Baseline: 150 bpm, Variability: Good {> 6 bpm), Accelerations: Reactive and Decelerations: Variable: mild  Labs:  No results found for this or any previous visit (from the past 24 hour(s)).  Imaging Studies:    MFM ultrasound yesterday still suggestive of blood clot and no further scans needed for that.  EFW 15%  Medications:  Scheduled    . ampicillin (OMNIPEN) IV  2 g Intravenous Q6H   Followed by  . amoxicillin  500 mg Oral Q8H  . docusate sodium  100 mg Oral Daily  . erythromycin  250 mg Intravenous Q6H   Followed by  . erythromycin  250 mg Oral Q6H  . prenatal multivitamin  1 tablet Oral Daily  . valACYclovir  500 mg Oral BID   I have reviewed the patient's current medications.  ASSESSMENT: Patient Active Problem List  Diagnoses  . Abnormal genetic  test in pregnancy  . Two vessel umbilical cord, antepartum  . Pregnancy, supervision of, high-risk  . HSV-2 infection complicating pregnancy  . Preterm premature rupture of membranes (PPROM) delivered, current hospitalization  . Abnormal fetal ultrasound  . Small for dates affecting management of mother    PLAN: Continue intpatient management.  Dima Mini S 12/17/2011,7:27 AM

## 2011-12-17 NOTE — Progress Notes (Signed)
Pt c/o painful cramping.  Placed toco for evaluation. No Uc's palpated.  No increase in vaginal bleeding.

## 2011-12-17 NOTE — Progress Notes (Signed)
12/17/11 1100  Clinical Encounter Type  Visited With Patient  Visit Type Initial  Referral From Nurse  Recommendations Provide spiritual support as needed.  Spiritual Encounters  Spiritual Needs Emotional  Stress Factors  Family Stress Factors (Many transitions in immediate and extended family.)    Made initial visit to offer pastoral support per RN suggestion.   Margarie was radiantly positive about her life and pregnancy, despite complications and many transitions.  Last year moved from [facility], where her father lives, to [place], where mother, brother, and sister live.  Brother is expecting baby this spring, too; sister is TTC, which complicates emotional dynamics, but is very supportive of pt.  Pt is in school for elementary education and planning her May wedding.  Father of her 51-year-old daughter Tobi Bastos is not involved.  Per pt, fiance (FOB) is very supportive.  Pt presents significant trust/contentment and very little anxiety.  Provided pastoral presence, encouragement, affirmation.  Pt states no spiritual needs at this time.   Spiritual care will follow as needed for spiritual/emotional support.  Pt aware of ongoing chaplain availability.  Avis Epley, [place] Chaplain [phone]

## 2011-12-18 ENCOUNTER — Encounter (HOSPITAL_COMMUNITY): Payer: Self-pay | Admitting: *Deleted

## 2011-12-18 DIAGNOSIS — O98519 Other viral diseases complicating pregnancy, unspecified trimester: Secondary | ICD-10-CM

## 2011-12-18 DIAGNOSIS — O4290 Premature rupture of membranes, unspecified as to length of time between rupture and onset of labor, unspecified weeks of gestation: Secondary | ICD-10-CM

## 2011-12-18 DIAGNOSIS — O411090 Infection of amniotic sac and membranes, unspecified, unspecified trimester, not applicable or unspecified: Secondary | ICD-10-CM

## 2011-12-18 LAB — ABO/RH: RH Type: POSITIVE

## 2011-12-18 LAB — GC/CHLAMYDIA PROBE AMP, GENITAL: Chlamydia: NEGATIVE

## 2011-12-18 LAB — ANTIBODY SCREEN: Antibody Screen: NEGATIVE

## 2011-12-18 LAB — HIV ANTIBODY (ROUTINE TESTING W REFLEX): HIV: NONREACTIVE

## 2011-12-18 NOTE — Progress Notes (Signed)
Patient ID: Leah Schneider, female   DOB: 10/23/1986, 26 y.o.   MRN: [id number] FACULTY PRACTICE ANTEPARTUM(COMPREHENSIVE) NOTE  Leah Schneider is a 26 y.o. G4P1021 at [redacted]w[redacted]d by best clinical estimate who is admitted for rupture of membranes.   Fetal presentation is breech. Length of Stay:  4  Days  Subjective: Bleeding noted when wipes, similar to 4 days ago Patient reports the fetal movement as active. Patient reports uterine contraction  activity as none. Patient reports  vaginal bleeding as less flow than a normal period. Patient describes fluid per vagina as Other slight blood stained.  Vitals:  Blood pressure 129/77, pulse 91, temperature 98 F (36.7 C), temperature source Oral, resp. rate 18, height 5' 5.25" (1.657 m), weight 104.599 kg (230 lb 9.6 oz), last menstrual period 05/17/2011. Physical Examination:  General appearance - alert, well appearing, and in no distress Heart - normal rate and regular rhythm Abdomen - soft, nontender, nondistended Fundal Height:  size equals dates Cervical Exam: Not evaluated. and found to be not evaluated/ / and fetal presentation is breech. Extremities: extremities normal, atraumatic, no cyanosis or edema and Homans sign is negative, no sign of DVT with DTRs 2+ bilaterally Membranes: ruptured  Fetal Monitoring:  Baseline: 140 bpm  Labs:  No results found for this or any previous visit (from the past 24 hour(s)).  Imaging Studies:    Marland Kitchen  Medications:  Scheduled    . amoxicillin  500 mg Oral Q8H  . docusate sodium  100 mg Oral Daily  . erythromycin  250 mg Oral Q6H  . prenatal multivitamin  1 tablet Oral Daily  . valACYclovir  500 mg Oral BID   I have reviewed the patient's current medications.  ASSESSMENT: Patient Active Problem List  Diagnoses  . Abnormal genetic test in pregnancy  . Two vessel umbilical cord, antepartum  . Pregnancy, supervision of, high-risk  . HSV-2 infection complicating pregnancy  . Preterm premature  rupture of membranes (PPROM) delivered, current hospitalization  . Abnormal fetal ultrasound  . Small for dates affecting management of mother    PLAN: Assess for s/sx of PTL, or increased vaginal bleeding.  Leah Schneider 12/18/2011,7:16 AM

## 2011-12-19 ENCOUNTER — Encounter (HOSPITAL_COMMUNITY): Payer: Self-pay | Admitting: *Deleted

## 2011-12-19 ENCOUNTER — Encounter (HOSPITAL_COMMUNITY): Payer: Self-pay | Admitting: Anesthesiology

## 2011-12-19 ENCOUNTER — Inpatient Hospital Stay (HOSPITAL_COMMUNITY): Payer: Medicaid Other | Admitting: Anesthesiology

## 2011-12-19 ENCOUNTER — Other Ambulatory Visit: Payer: Self-pay | Admitting: Obstetrics and Gynecology

## 2011-12-19 ENCOUNTER — Encounter (HOSPITAL_COMMUNITY)
Admission: AD | Disposition: A | Discharge status: Home / Self Care | Payer: Self-pay | Source: Ambulatory Visit | Attending: Obstetrics & Gynecology

## 2011-12-19 LAB — CBC
HCT: 36.7 % (ref 36.0–46.0)
Hemoglobin: 12.2 g/dL (ref 12.0–15.0)
MCV: 87.6 fL (ref 78.0–100.0)
RDW: 13.3 % (ref 11.5–15.5)
WBC: 14.3 10*3/uL — ABNORMAL HIGH (ref 4.0–10.5)

## 2011-12-19 LAB — TYPE AND SCREEN

## 2011-12-19 LAB — ABO/RH: ABO/RH(D): O POS

## 2011-12-19 SURGERY — Surgical Case
Anesthesia: Spinal | Site: Abdomen | Wound class: Clean Contaminated

## 2011-12-19 MED ORDER — MORPHINE SULFATE (PF) 0.5 MG/ML IJ SOLN
INTRAMUSCULAR | Status: DC | PRN
  Administered 2011-12-19: 1 mg via INTRAVENOUS
  Administered 2011-12-19 (×2): 1 mg via EPIDURAL

## 2011-12-19 MED ORDER — MAGNESIUM SULFATE 40 G IN LACTATED RINGERS - SIMPLE: 2.0000 g/h | INTRAVENOUS | Status: DC

## 2011-12-19 MED ORDER — SIMETHICONE 80 MG PO CHEW: 80.0000 mg | CHEWABLE_TABLET | ORAL | Status: DC | PRN

## 2011-12-19 MED ORDER — OXYTOCIN 20 UNITS IN LACTATED RINGERS INFUSION - SIMPLE
125.0000 mL/h | INTRAVENOUS | Status: AC
  Administered 2011-12-19: 125 mL/h via INTRAVENOUS
  Filled 2011-12-19 (×2): qty 1000

## 2011-12-19 MED ORDER — NIFEDIPINE 10 MG PO CAPS
ORAL_CAPSULE | ORAL | Status: AC
  Filled 2011-12-19: qty 1

## 2011-12-19 MED ORDER — DIPHENHYDRAMINE HCL 25 MG PO CAPS: 25.0000 mg | ORAL_CAPSULE | Freq: Four times a day (QID) | ORAL | Status: DC | PRN

## 2011-12-19 MED ORDER — PHENYLEPHRINE HCL 10 MG/ML IJ SOLN
INTRAMUSCULAR | Status: DC | PRN
  Administered 2011-12-19 (×3): 80 ug via INTRAVENOUS

## 2011-12-19 MED ORDER — LACTATED RINGERS IV SOLN: INTRAVENOUS | Status: DC

## 2011-12-19 MED ORDER — MEPERIDINE HCL 25 MG/ML IJ SOLN: 6.2500 mg | INTRAMUSCULAR | Status: DC | PRN

## 2011-12-19 MED ORDER — PHENYLEPHRINE 40 MCG/ML (10ML) SYRINGE FOR IV PUSH (FOR BLOOD PRESSURE SUPPORT)
PREFILLED_SYRINGE | INTRAVENOUS | Status: AC
  Filled 2011-12-19: qty 10

## 2011-12-19 MED ORDER — CITRIC ACID-SODIUM CITRATE 334-500 MG/5ML PO SOLN: 30.0000 mL | Freq: Once | ORAL | Status: DC

## 2011-12-19 MED ORDER — IBUPROFEN 600 MG PO TABS: 600.0000 mg | ORAL_TABLET | Freq: Four times a day (QID) | ORAL | Status: DC | PRN

## 2011-12-19 MED ORDER — WITCH HAZEL-GLYCERIN EX PADS: 1.0000 "application " | MEDICATED_PAD | CUTANEOUS | Status: DC | PRN

## 2011-12-19 MED ORDER — SENNOSIDES-DOCUSATE SODIUM 8.6-50 MG PO TABS
2.0000 | ORAL_TABLET | Freq: Every day | ORAL | Status: DC
  Administered 2011-12-19 – 2011-12-21 (×3): 2 via ORAL

## 2011-12-19 MED ORDER — OXYTOCIN 10 UNIT/ML IJ SOLN
INTRAMUSCULAR | Status: AC
  Filled 2011-12-19: qty 4

## 2011-12-19 MED ORDER — METOCLOPRAMIDE HCL 5 MG/ML IJ SOLN: 10.0000 mg | Freq: Three times a day (TID) | INTRAMUSCULAR | Status: DC | PRN

## 2011-12-19 MED ORDER — NIFEDIPINE 10 MG PO CAPS: 10.0000 mg | ORAL_CAPSULE | Freq: Once | ORAL | Status: DC

## 2011-12-19 MED ORDER — TETANUS-DIPHTH-ACELL PERTUSSIS 5-2.5-18.5 LF-MCG/0.5 IM SUSP
0.5000 mL | Freq: Once | INTRAMUSCULAR | Status: DC
  Filled 2011-12-19: qty 0.5

## 2011-12-19 MED ORDER — IBUPROFEN 600 MG PO TABS
600.0000 mg | ORAL_TABLET | Freq: Four times a day (QID) | ORAL | Status: DC
  Administered 2011-12-19 – 2011-12-22 (×11): 600 mg via ORAL
  Filled 2011-12-19 (×12): qty 1

## 2011-12-19 MED ORDER — NALOXONE HCL 0.4 MG/ML IJ SOLN: 0.4000 mg | INTRAMUSCULAR | Status: DC | PRN

## 2011-12-19 MED ORDER — KETOROLAC TROMETHAMINE 30 MG/ML IJ SOLN
30.0000 mg | Freq: Four times a day (QID) | INTRAMUSCULAR | Status: AC | PRN
  Administered 2011-12-19: 30 mg via INTRAVENOUS
  Filled 2011-12-19: qty 1

## 2011-12-19 MED ORDER — ONDANSETRON HCL 4 MG/2ML IJ SOLN: 4.0000 mg | INTRAMUSCULAR | Status: DC | PRN

## 2011-12-19 MED ORDER — CEFAZOLIN SODIUM-DEXTROSE 2-3 GM-% IV SOLR
2.0000 g | INTRAVENOUS | Status: AC
  Administered 2011-12-19: 2 g via INTRAVENOUS
  Filled 2011-12-19: qty 50

## 2011-12-19 MED ORDER — ONDANSETRON HCL 4 MG/2ML IJ SOLN: 4.0000 mg | Freq: Three times a day (TID) | INTRAMUSCULAR | Status: DC | PRN

## 2011-12-19 MED ORDER — MAGNESIUM SULFATE BOLUS VIA INFUSION
4.0000 g | Freq: Once | INTRAVENOUS | Status: DC
  Filled 2011-12-19: qty 500

## 2011-12-19 MED ORDER — MENTHOL 3 MG MT LOZG: 1.0000 | LOZENGE | OROMUCOSAL | Status: DC | PRN

## 2011-12-19 MED ORDER — DIPHENHYDRAMINE HCL 25 MG PO CAPS
25.0000 mg | ORAL_CAPSULE | ORAL | Status: DC | PRN
Start: 2011-12-19 — End: 2011-12-22
  Filled 2011-12-19: qty 1

## 2011-12-19 MED ORDER — DIPHENHYDRAMINE HCL 50 MG/ML IJ SOLN: 12.5000 mg | INTRAMUSCULAR | Status: DC | PRN

## 2011-12-19 MED ORDER — KETOROLAC TROMETHAMINE 60 MG/2ML IM SOLN
60.0000 mg | Freq: Once | INTRAMUSCULAR | Status: AC | PRN
  Filled 2011-12-19: qty 2

## 2011-12-19 MED ORDER — SODIUM CHLORIDE 0.9 % IJ SOLN: 3.0000 mL | INTRAMUSCULAR | Status: DC | PRN

## 2011-12-19 MED ORDER — SIMETHICONE 80 MG PO CHEW
80.0000 mg | CHEWABLE_TABLET | Freq: Three times a day (TID) | ORAL | Status: DC
  Administered 2011-12-19 – 2011-12-22 (×11): 80 mg via ORAL

## 2011-12-19 MED ORDER — ONDANSETRON HCL 4 MG/2ML IJ SOLN
INTRAMUSCULAR | Status: AC
  Filled 2011-12-19: qty 2

## 2011-12-19 MED ORDER — FENTANYL CITRATE 0.05 MG/ML IJ SOLN
INTRAMUSCULAR | Status: AC
  Filled 2011-12-19: qty 4

## 2011-12-19 MED ORDER — NALBUPHINE HCL 10 MG/ML IJ SOLN
5.0000 mg | INTRAMUSCULAR | Status: DC | PRN
  Filled 2011-12-19: qty 1

## 2011-12-19 MED ORDER — CITRIC ACID-SODIUM CITRATE 334-500 MG/5ML PO SOLN
ORAL | Status: AC
  Filled 2011-12-19: qty 15

## 2011-12-19 MED ORDER — OXYCODONE-ACETAMINOPHEN 5-325 MG PO TABS
1.0000 | ORAL_TABLET | ORAL | Status: DC | PRN
  Administered 2011-12-20: 1 via ORAL
  Administered 2011-12-20: 2 via ORAL
  Administered 2011-12-20 – 2011-12-21 (×2): 1 via ORAL
  Administered 2011-12-21: 2 via ORAL
  Administered 2011-12-22 (×2): 1 via ORAL
  Administered 2011-12-22: 2 via ORAL
  Filled 2011-12-19: qty 1
  Filled 2011-12-19: qty 2
  Filled 2011-12-19: qty 1
  Filled 2011-12-19 (×2): qty 2
  Filled 2011-12-19 (×2): qty 1
  Filled 2011-12-19: qty 2

## 2011-12-19 MED ORDER — DIPHENHYDRAMINE HCL 50 MG/ML IJ SOLN: 25.0000 mg | INTRAMUSCULAR | Status: DC | PRN

## 2011-12-19 MED ORDER — SODIUM CHLORIDE 0.9 % IV SOLN: 1.0000 ug/kg/h | INTRAVENOUS | Status: DC | PRN

## 2011-12-19 MED ORDER — MORPHINE SULFATE (PF) 0.5 MG/ML IJ SOLN
INTRAMUSCULAR | Status: DC | PRN
  Administered 2011-12-19: .15 mg via INTRATHECAL

## 2011-12-19 MED ORDER — MORPHINE SULFATE 0.5 MG/ML IJ SOLN
INTRAMUSCULAR | Status: AC
  Filled 2011-12-19: qty 10

## 2011-12-19 MED ORDER — MAGNESIUM SULFATE 40 G IN LACTATED RINGERS - SIMPLE
2.0000 g/h | INTRAVENOUS | Status: DC
  Administered 2011-12-19: 4 g/h via INTRAVENOUS
  Filled 2011-12-19: qty 500

## 2011-12-19 MED ORDER — SCOPOLAMINE 1 MG/3DAYS TD PT72: 1.0000 | MEDICATED_PATCH | Freq: Once | TRANSDERMAL | Status: DC

## 2011-12-19 MED ORDER — ZOLPIDEM TARTRATE 5 MG PO TABS: 5.0000 mg | ORAL_TABLET | Freq: Every evening | ORAL | Status: DC | PRN

## 2011-12-19 MED ORDER — OXYTOCIN 10 UNIT/ML IJ SOLN
INTRAMUSCULAR | Status: DC | PRN
  Administered 2011-12-19: 20 [IU] via INTRAMUSCULAR

## 2011-12-19 MED ORDER — LANOLIN HYDROUS EX OINT: 1.0000 "application " | TOPICAL_OINTMENT | CUTANEOUS | Status: DC | PRN

## 2011-12-19 MED ORDER — KETOROLAC TROMETHAMINE 30 MG/ML IJ SOLN: 30.0000 mg | Freq: Four times a day (QID) | INTRAMUSCULAR | Status: AC | PRN

## 2011-12-19 MED ORDER — MAGNESIUM SULFATE BOLUS VIA INFUSION: 4.0000 g | Freq: Once | INTRAVENOUS | Status: DC

## 2011-12-19 MED ORDER — ONDANSETRON HCL 4 MG PO TABS: 4.0000 mg | ORAL_TABLET | ORAL | Status: DC | PRN

## 2011-12-19 MED ORDER — FENTANYL CITRATE 0.05 MG/ML IJ SOLN
INTRAMUSCULAR | Status: DC | PRN
  Administered 2011-12-19: 75 ug via INTRAVENOUS
  Administered 2011-12-19: 25 ug via INTRATHECAL

## 2011-12-19 MED ORDER — DIBUCAINE 1 % RE OINT: 1.0000 "application " | TOPICAL_OINTMENT | RECTAL | Status: DC | PRN

## 2011-12-19 MED ORDER — PRENATAL MULTIVITAMIN CH
1.0000 | ORAL_TABLET | Freq: Every day | ORAL | Status: DC
  Administered 2011-12-19 – 2011-12-22 (×4): 1 via ORAL
  Filled 2011-12-19 (×4): qty 1

## 2011-12-19 MED ORDER — FENTANYL CITRATE 0.05 MG/ML IJ SOLN: 25.0000 ug | INTRAMUSCULAR | Status: DC | PRN

## 2011-12-19 MED ORDER — ONDANSETRON HCL 4 MG/2ML IJ SOLN
INTRAMUSCULAR | Status: DC | PRN
  Administered 2011-12-19: 4 mg via INTRAVENOUS

## 2011-12-19 SURGICAL SUPPLY — 30 items
BENZOIN TINCTURE PRP APPL 2/3 (GAUZE/BANDAGES/DRESSINGS) ×2 IMPLANT
CLOSURE STERI STRIP 1/2 X4 (GAUZE/BANDAGES/DRESSINGS) ×2 IMPLANT
CLOTH BEACON ORANGE TIMEOUT ST (SAFETY) ×2 IMPLANT
DRSG COVADERM 4X8 (GAUZE/BANDAGES/DRESSINGS) ×2 IMPLANT
ELECT REM PT RETURN 9FT ADLT (ELECTROSURGICAL) ×2
ELECTRODE REM PT RTRN 9FT ADLT (ELECTROSURGICAL) ×1 IMPLANT
EXTRACTOR VACUUM KIWI (MISCELLANEOUS) IMPLANT
GLOVE BIO SURGEON ST LM GN SZ9 (GLOVE) ×2 IMPLANT
GLOVE BIOGEL PI IND STRL 9 (GLOVE) ×2 IMPLANT
GLOVE BIOGEL PI INDICATOR 9 (GLOVE) ×2
GOWN PREVENTION PLUS LG XLONG (DISPOSABLE) ×2 IMPLANT
GOWN PREVENTION PLUS XLARGE (GOWN DISPOSABLE) ×2 IMPLANT
GOWN STRL REIN 3XL LVL4 (GOWN DISPOSABLE) ×2 IMPLANT
NEEDLE HYPO 25X5/8 SAFETYGLIDE (NEEDLE) IMPLANT
NS IRRIG 1000ML POUR BTL (IV SOLUTION) ×2 IMPLANT
PACK C SECTION WH (CUSTOM PROCEDURE TRAY) ×2 IMPLANT
RETRACTOR WND ALEXIS 25 LRG (MISCELLANEOUS) IMPLANT
RTRCTR C-SECT PINK 25CM LRG (MISCELLANEOUS) IMPLANT
RTRCTR WOUND ALEXIS 25CM LRG (MISCELLANEOUS)
SLEEVE SCD COMPRESS KNEE MED (MISCELLANEOUS) ×2 IMPLANT
STRIP CLOSURE SKIN 1/2X4 (GAUZE/BANDAGES/DRESSINGS) IMPLANT
SUT CHROMIC 0 CTX 36 (SUTURE) ×4 IMPLANT
SUT VIC AB 0 CT1 27 (SUTURE) ×1
SUT VIC AB 0 CT1 27XBRD ANBCTR (SUTURE) ×1 IMPLANT
SUT VIC AB 2-0 CT1 27 (SUTURE) ×2
SUT VIC AB 2-0 CT1 TAPERPNT 27 (SUTURE) ×2 IMPLANT
SUT VIC AB 4-0 KS 27 (SUTURE) ×2 IMPLANT
SYR BULB IRRIGATION 50ML (SYRINGE) ×2 IMPLANT
TRAY FOLEY CATH 14FR (SET/KITS/TRAYS/PACK) ×2 IMPLANT
WATER STERILE IRR 1000ML POUR (IV SOLUTION) IMPLANT

## 2011-12-19 NOTE — Anesthesia Procedure Notes (Signed)

## 2011-12-19 NOTE — Progress Notes (Signed)
Pt. To OR suite at this time for cesarean section per Dr. Emelda Fear

## 2011-12-19 NOTE — Progress Notes (Signed)
FACULTY PRACTICE ANTEPARTUM(COMPREHENSIVE) NOTE  Leah Schneider is a 26 y.o. [id number] at [redacted]w[redacted]d by best clinical estimate who is admitted for PROM.   Fetal presentation is breech. Length of Stay:  5  Days  Subjective: patient has begun to contract this morning every 5 minutes, moderate, nonindentible Patient reports the fetal movement as active. Patient reports uterine contraction  activity as regular, every 5 minutes. Patient reports  vaginal bleeding as none. Patient describes fluid per vagina as Clear.  Vitals:  Blood pressure 120/59, pulse 97, temperature 97.7 F (36.5 C), temperature source Axillary, resp. rate 20, height 5' 5.25" (1.657 m), weight 104.282 kg (229 lb 14.4 oz), last menstrual period 05/17/2011. Physical Examination:  General appearance - alert, well appearing, and in no distress and anxious Heart - normal rate and regular rhythm Abdomen - soft, nontender, nondistended Fundal Height:  size equals dates Cervical Exam: Not evaluated. and found to be closed at last exam/ Long/-2 and fetal presentation is breech. Extremities: extremities normal, atraumatic, no cyanosis or edema and Homans sign is negative, no sign of DVT with DTRs 2+ bilaterally Membranes:intact, ruptured  Fetal Monitoring:  Baseline: 170 bpm  Labs: cbc pending No results found for this or any previous visit (from the past 24 hour(s)).  Imaging Studies:     Currently EPIC will not allow sonographic studies to automatically populate into notes.  In the meantime, copy and paste results into note or free text.  Medications:  Scheduled    .  ceFAZolin (ANCEF) IV  2 g Intravenous On Call to OR  . citric acid-sodium citrate      . citric acid-sodium citrate  30 mL Oral Once  . docusate sodium  100 mg Oral Daily  . erythromycin  250 mg Oral Q6H  . magnesium  4 g Intravenous Once  . NIFEdipine  10 mg Oral Once  . prenatal multivitamin  1 tablet Oral Daily  . valACYclovir  500 mg Oral BID  . DISCONTD:  amoxicillin  500 mg Oral Q8H   I have reviewed the patient's current medications.  ASSESSMENT: Patient Active Problem List  Diagnoses  . Abnormal genetic test in pregnancy  . Two vessel umbilical cord, antepartum  . Pregnancy, supervision of, high-risk  . HSV-2 infection complicating pregnancy  . Preterm premature rupture of membranes (PPROM) delivered, current hospitalization  . Abnormal fetal ultrasound  . Small for dates affecting management of mother   Preterm labor, PPROM, Breech, s/p BMZ, possible chorioamnionitis. PLAN: confirmm presentation, if breech persists, will proceed to cesarean for delivery.  MgSulfate 4 gm loading dose given. OR notified  Arraya Buck V 12/19/2011,7:09 AM

## 2011-12-19 NOTE — Op Note (Signed)
See operative note details dictated in the brief of note.

## 2011-12-19 NOTE — Transfer of Care (Signed)
Immediate Anesthesia Transfer of Care Note  Patient: Leah Schneider  Procedure(s) Performed: Procedure(s) (LRB): CESAREAN SECTION (N/A)  Patient Location: PACU  Anesthesia Type: Spinal  Level of Consciousness: awake, alert  and oriented  Airway & Oxygen Therapy: Patient Spontanous Breathing  Post-op Assessment: Report given to PACU RN  Post vital signs: Reviewed and stable  Complications: No apparent anesthesia complications

## 2011-12-19 NOTE — Anesthesia Postprocedure Evaluation (Signed)
  Anesthesia Post-op Note  Patient: Leah Schneider  Procedure(s) Performed: Procedure(s) (LRB): CESAREAN SECTION (N/A)  Patient Location: PACU  Anesthesia Type: Spinal  Level of Consciousness: awake, alert  and oriented  Airway and Oxygen Therapy: Patient Spontanous Breathing  Post-op Pain: none  Post-op Assessment: Post-op Vital signs reviewed, Patient's Cardiovascular Status Stable, Respiratory Function Stable, Patent Airway, No signs of Nausea or vomiting, Pain level controlled, No headache and No backache  Post-op Vital Signs: Reviewed and stable  Complications: No apparent anesthesia complications

## 2011-12-19 NOTE — Progress Notes (Signed)
UR Chart review completed.  

## 2011-12-19 NOTE — Anesthesia Preprocedure Evaluation (Signed)
Anesthesia Evaluation  Patient identified by MRN, date of birth, ID band Patient awake    Reviewed: Allergy & Precautions, H&P , Patient's Chart, lab work & pertinent test results  Airway Mallampati: II TM Distance: >3 FB Neck ROM: full    Dental No notable dental hx.    Pulmonary  clear to auscultation  Pulmonary exam normal       Cardiovascular Exercise Tolerance: Good regular Normal    Neuro/Psych    GI/Hepatic   Endo/Other  Morbid obesity  Renal/GU      Musculoskeletal   Abdominal   Peds  Hematology   Anesthesia Other Findings   Reproductive/Obstetrics                           Anesthesia Physical Anesthesia Plan  ASA: III  Anesthesia Plan: Spinal   Post-op Pain Management:    Induction:   Airway Management Planned:   Additional Equipment:   Intra-op Plan:   Post-operative Plan:   Informed Consent: I have reviewed the patients History and Physical, chart, labs and discussed the procedure including the risks, benefits and alternatives for the proposed anesthesia with the patient or authorized representative who has indicated his/her understanding and acceptance.   Dental Advisory Given  Plan Discussed with: CRNA  Anesthesia Plan Comments: (Lab work confirmed with CRNA in room. Platelets okay. Discussed spinal anesthetic, and patient consents to the procedure:  included risk of possible headache,backache, failed block, allergic reaction, and nerve injury. This patient was asked if she had any questions or concerns before the procedure started. )        Anesthesia Quick Evaluation  

## 2011-12-19 NOTE — Brief Op Note (Signed)
12/14/2011 - 12/19/2011  9:06 AM  PATIENT:  Leah Schneider  26 y.o. female  PRE-OPERATIVE DIAGNOSIS:  Fetal Tachycardia, preterm rupture of membranes, preterm labor, footling breech presentation, 30+ weeks,  POST-OPERATIVE DIAGNOSIS:  Same, delivered  PROCEDURE:  Procedure(s) (LRB): CESAREAN SECTION (N/A)low transverse  SURGEON:  Surgeon(s) and Role:    * Tilda Burrow, MD - Primary  PHYSICIAN ASSISTANT:   ASSISTANTS: none   ANESTHESIA:   spinal  EBL:  Total I/O In: 500 [I.V.:500] Out: 850 [Urine:200; Blood:650]  BLOOD ADMINISTERED:none  DRAINS: Urinary Catheter (Foley)   LOCAL MEDICATIONS USED:  NONE  SPECIMEN:  Source of Specimen:  placenta to pathology  DISPOSITION OF SPECIMEN:  PATHOLOGY  COUNTS:  YES  TOURNIQUET:  * No tourniquets in log *  DICTATION: .Dragon Dictation Patient was taken the operating room after the diagnosis of recurrent preterm labor made with the suspicion of chorioamnionitis based on fetal tachycardia and slight uterine tenderness. Transverse Pfannenstiel type incision was performed with easy entry of the peritoneal cavity in the midline. A ligament segment was considered sufficiently developed the port transverse incision is made above the bladder flap which did not require development. There the incision was made at the inferior edge of the placenta anterior. Fetal feet were identified and breech extraction performed with sweeping of the arms in front of the head and delivering the head itself by careful gentle traction while maintaining Shin anatomic position and elevation of the incision edges over the head. Fundal pressure was being applied throughout this portion of the procedure Cord was clamped. The placenta was noted to be normal in appearance but the membranes had a thickened whitish discoloration near the cervix the cord itself and the infant had this similar whitish discoloration. There was no significant malodor appreciated.  Cord blood  gas sample was obtained, but was not available at the end of the case. Routine labs obtained. Placenta sent to pathology uterus was swabbed out confirmed as emptied a running locking first layer and continuous running second layer used to close the uterine incision without difficulty the and was irrigated MI and laparotomy, removed including the [employer] retractor and was closed anteriorly with 2-0 Vicryl, the fascia closed with running 0 Vicryl, and the subcutaneous tissues reapproximated with 3 interrupted sutures of 2-0 Vicryl. The use  of 4-0 Vicryl subcuticular closure of the skin incision completed the procedure. Sponge and needle counts were correct. She to recovery room in stable condition   plAN OF CARE: continued inpatient care  PATIENT DISPOSITION:  PACU - hemodynamically stable.   Delay start of Pharmacological VTE agent (>24hrs) due to surgical blood loss or risk of bleeding: not applicable

## 2011-12-20 ENCOUNTER — Encounter (HOSPITAL_COMMUNITY): Payer: Self-pay | Admitting: Obstetrics and Gynecology

## 2011-12-20 LAB — CBC
MCH: 28.9 pg (ref 26.0–34.0)
MCHC: 32.7 g/dL (ref 30.0–36.0)
MCV: 88.4 fL (ref 78.0–100.0)
Platelets: 189 10*3/uL (ref 150–400)
RDW: 13.5 % (ref 11.5–15.5)

## 2011-12-20 MED ORDER — POLYETHYLENE GLYCOL 3350 17 G PO PACK
17.0000 g | PACK | Freq: Every day | ORAL | Status: DC | PRN
  Administered 2011-12-20: 17 g via ORAL
  Filled 2011-12-20: qty 1

## 2011-12-20 NOTE — Progress Notes (Signed)
Subjective: Postpartum Day 1: Cesarean Delivery at 30.6 weeks, breech, PPROM, chorioamnionitis Patient reports incisional pain, tolerating PO and no problems voiding.  No flatus yet.  Objective: Vital signs in last 24 hours: Temp:  [97.3 F (36.3 C)-98.6 F (37 C)] 97.5 F (36.4 C) (02/22 0543) Pulse Rate:  [82-99] 95  (02/22 0543) Resp:  [16-23] 18  (02/22 0543) BP: (109-129)/(61-79) 119/79 mmHg (02/22 0543) SpO2:  [95 %-99 %] 97 % (02/22 0543)  Physical Exam:  General: alert and no distress Lochia: appropriate Uterine Fundus: firm, moderately tender to palpation Incision: healing well, no significant drainage, no significant erythema DVT Evaluation: No evidence of DVT seen on physical exam. Negative Homan's sign.   Basename 12/20/11 0530 12/19/11 0700  HGB 11.7* 12.2  HCT 35.8* 36.7    Assessment/Plan: Status post Cesarean section. Doing well postoperatively.  Breast feeding; desires OCPs for BCM. Continue routine postpartum and postoperative care  Pennie Vanblarcom A 12/20/2011, 7:48 AM

## 2011-12-20 NOTE — Progress Notes (Signed)
PSYCHOSOCIAL ASSESSMENT ~ MATERNAL/CHILD Name: Braxton Weisbecker                                                                                        Age: 26   Referral Date: 12/20/11   Reason/Source: NICU support  I. FAMILY/HOME ENVIRONMENT Child's Legal Guardian _x__Parent(s) ___Grandparent ___Foster parent ___DSS_________________ Name: Linden Dolin                                          DOB: 04-16-1986           Age: 72   Address: [address] [place], [place] [address]   Name: Livie Vanderhoof                                       DOB: //                     Age:   Address: same  Other Household Members/Support Persons Name: Hassie Bruce (5)             Relationship: sister              DOB ___/___/___                   Name:                                         Relationship:                        DOB ___/___/___                   Name:                                         Relationship:                        DOB ___/___/___                   Name:                                         Relationship:                        DOB ___/___/___  C. Other Support: Parents report having a good support system.   PSYCHOSOCIAL DATA Information Source  _x_Patient Interview  _x_Family Interview           _x_Other: chart  Financial and Community Resources _x_Employment: FOB works for [employer], MOB works for the [employer] after school program. _x_Medicaid    [place]: [employer]                __Private Insurance:                   __Self Pay  _x_Food Stamps   _x_WIC __[employer]     __Public Housing     __Section 8    __Maternity Care Coordination/Child Service Coordination/Early Intervention  __School:                                                                         Grade:  __Other:   Insurance risk surveyor and Environment Information Cultural Issues Impacting Care: none known  STRENGTHS _x__Supportive  family/friends _x__Adequate Resources _x__Compliance with medical plan _x__Home prepared for Child (including basic supplies) _x__Understanding of illness      _x__Other: MOB plans to take baby to [facility], but needs to call to confirm that they are accepting new Medicaid patients at this time.  RISK FACTORS AND CURRENT PROBLEMS         __x__No Problems Noted                                                                                                                                                                                                                                       Pt              Family     Substance Abuse                                                                ___              ___  Mental Illness                                                                        ___              ___  Family/Relationship Issues                                      ___               ___             Abuse/Neglect/Domestic Violence                                         ___         ___  Financial Resources                                        ___              ___             Transportation                                                                        ___               ___  DSS Involvement                                                                   ___              ___  Adjustment to Illness                                                               ___              ___  Knowledge/Cognitive Deficit                                                   ___              ___  Compliance with Treatment                                                 ___              ___  Basic Needs (food, housing, etc.)                                          ___              ___             Housing Concerns                                       ___              ___ Other_____________________________________________________________            SOCIAL WORK  ASSESSMENT SW met with parents in MOB's third floor room to introduce myself, complete assessment and evaluate how family is coping with baby's admission to NICU.  Parents are very friendly.  MOB has a 70 year old daughter who was present and very talkative.  She is sad that she cannot visit with her baby sister.  SW suggested parents use skype or facetime on their phones in order for her to see her sister while baby is in the NICU.  MOB liked this idea.  She stated that she has kept her daughter very involved with her pregnancy.  MOB told SW that as soon as she was diagnosed with PTL, she ordered everything they needed for baby online and now they just have to get everything set up.  They report having good supports, and although they live almost an hour away, they will not have issues with transportation.  MOB plans to be here daily.  FOB works very long hours, but has next week off.  He states that her mother has the following week off, so they will transport her until she is healed from her c-section.  SW explained support services offered by NICU SWs and told family how they can contact SW if needed.  They state no questions or needs at this time.  SW informed [employer] of sibling in order to get her a sibling orientation bag.    SOCIAL WORK PLAN  ___No Further Intervention Required/No Barriers to Discharge   _x__Psychosocial Support and Ongoing Assessment of Needs   ___Patient/Family Education:   ___Child Protective Services Report   County___________ Date___/____/____   ___Information/Referral to [employer]_________________________   ___Other:

## 2011-12-21 NOTE — Progress Notes (Signed)
Subjective: Postpartum Day 2: Cesarean Delivery Patient reports incisional pain, tolerating PO, + flatus and no problems voiding.    Objective: Vital signs in last 24 hours: Temp:  [97.4 F (36.3 C)-98.3 F (36.8 C)] 98.3 F (36.8 C) (02/23 0558) Pulse Rate:  [79-108] 94  (02/23 0558) Resp:  [18] 18  (02/23 0558) BP: (105-120)/(71-74) 110/73 mmHg (02/23 0558) SpO2:  [97 %-99 %] 99 % (02/23 0558)  Physical Exam:  General: alert, cooperative and no distress Lochia: appropriate Uterine Fundus: firm Incision: healing well DVT Evaluation: No evidence of DVT seen on physical exam.   Basename 12/20/11 0530 12/19/11 0700  HGB 11.7* 12.2  HCT 35.8* 36.7    Assessment/Plan: Status post Cesarean section. Doing well postoperatively.  Discharge tomorrow.  Continue with pain control.  Postpartum depression discussed.  Ramiz Turpin JEHIEL 12/21/2011, 9:08 AM

## 2011-12-21 NOTE — Progress Notes (Signed)
Went to recheck pt b/p. Pt not in room. Will recheck when pt returns to floor.

## 2011-12-22 MED ORDER — OXYCODONE-ACETAMINOPHEN 5-325 MG PO TABS: 1.0000 | ORAL_TABLET | ORAL | Status: AC | PRN

## 2011-12-22 MED ORDER — IBUPROFEN 600 MG PO TABS: 600.0000 mg | ORAL_TABLET | Freq: Four times a day (QID) | ORAL | Status: AC

## 2011-12-22 NOTE — Discharge Instructions (Signed)
Cesarean Delivery °Care After °Refer to this sheet in the next few weeks. These instructions provide you with information on caring for yourself after your procedure. Your caregiver may also give you more specific instructions. Your treatment has been planned according to current medical practices, but problems sometimes occur. Call your caregiver if you have any problems or questions after your procedure. °HOME CARE INSTRUCTIONS °Healing will take time. You will have discomfort, tenderness, swelling, and bruising at the surgery site for a couple of weeks. This is normal and will get better as time goes on. °Activity °· Rest as much as possible the first 2 weeks.  °· When possible, have someone help you with your household activities and your baby for 2 to 3 weeks.  °· Limit your housework and social activity. Increase your activity gradually as your strength returns.  °· Do not climb stairs more than 2 to 3 times a day.  °· Do not lift anything heavier than your baby.  °· Follow your caregiver's instructions about driving a car.  °· Exercise only as directed by your caregiver.  °Nutrition °· You may return to your usual diet. Eat a well-balanced diet.  °· Drink enough fluid to keep your urine clear or pale yellow.  °· Keep taking your prenatal or multivitamins.  °· Do not drink alcohol until your caregiver says it is okay.  °Elimination °You should return to your usual bowel function. If you develop constipation, ask your caregiver about taking a mild laxative that will help you go to the bathroom. Bran foods and fluids help with constipation. Gradually add fruit, vegetables, and bran to your diet.  °Hygiene °· You may shower, wash your hair, and take tub baths unless your caregiver tells you otherwise.  °· Continue perineal care until your vaginal bleeding and discharge stops.  °· Do not douche or use tampons until your caregiver says it is okay.  °Fever °If you feel feverish or have shaking chills, take your  temperature. The fever may indicate infection. Infections can be treated with antibiotic medicine. °Pain Control and Medicine °· Only take over-the-counter or prescription medicine as directed by your caregiver. Do not take aspirin. It can cause bleeding.  °· Do not drive when taking pain medicine.  °· Talk to your caregiver about restarting or adjusting your normal medicines.  °Incision Care °· Clean your cut (incision) gently with soap and water, then pat dry.  °· If your caregiver says it is okay, leave the incision without a bandage (dressing) unless it is draining fluid or irritated.  °· If you have small adhesive strips across the incision and they do not fall off within 7 days, carefully peel them off.  °· Check the incision daily for increased redness, drainage, swelling, or separation of skin.  °· Hug a pillow when coughing or sneezing. This helps to relieve pain.  °Vaginal Care °You may have a vaginal discharge or bleeding for up to 6 weeks. If the vaginal discharge becomes bright red, bad smelling, heavy in amount, has blood clots, or if you have burning or frequent urination, call your caregiver. If your bleeding slows down and then gets heavier, your body is telling you to slow down and relax more. °Sexual Intercourse °· Check with your caregiver before resuming sexual activity. Often, after 4 to 6 weeks, if you feel good and are well rested, sexual activity may be resumed. Avoid positions that strain the incision site.  °· You can become pregnant before you have a period.   If you decide to have sexual intercourse, use birth control if you do not want to become pregnant right away.  °Health Practices °· Keep all your postpartum appointments as recommended by your caregiver. Generally, your caregiver will want to see you in 2 to 3 weeks.  °· Continue with your yearly pelvic exams.  °· Continue monthly self-breast exams and yearly physical exams with a Pap test.  °Breast Care °· If you are not  breastfeeding and your breasts become tender, hard, or leak milk, you may wear a tight-fitting bra and apply ice to your breasts.  °· If you are breastfeeding, wear a good support bra.  °· Call your caregiver if you have breast pain, flu-like symptoms, fever, or hardness and reddening of your breasts.  °Postpartum Blues °You may have a period of low spirits or "blues" after your baby is born. Discuss your feelings with your partner, family, and friends. This may be caused by the changing hormone levels in your body. You may want to contact your caregiver if this is worrisome. °Miscellaneous °· Limit wearing support panties or control-top hose.  °· If you breastfeed, you may not have a period for several months or longer. This is normal for the nursing mother. If you do not menstruate within 6 weeks after you stop breastfeeding, see your caregiver.  °· If you are not breastfeeding, you can expect to menstruate within 6 to 10 weeks after birth. If you have not started by the 11th week, check with your caregiver.  °SEEK MEDICAL CARE IF:  °· There is swelling, redness, or increasing pain in the wound area.  °· You have pus coming from the wound.  °· You notice a bad smell from the wound or surgical dressing.  °· You have pain, redness, and swelling from the intravenous (IV) site.  °· Your wound breaks open (the edges are not staying together).  °· You feel dizzy or feel like fainting.  °· You develop pain or bleeding when you urinate.  °· You develop diarrhea.  °· You develop nausea and vomiting.  °· You develop abnormal vaginal discharge.  °· You develop a rash.  °· You have any type of abnormal reaction or develop an allergy to your medicine.  °· Your pain is not relieved by your medicine or becomes worse.  °· Your temperature is 101° F (38.3° C), or is 100.4° F (38° C) taken 2 times in a 4 hour period.  °SEEK IMMEDIATE MEDICAL CARE: °· You develop a temperature of 102° F (38.9° C) or higher.  °· You develop abdominal  pain.  °· You develop chest pain.  °· You develop shortness of breath.  °· You faint.  °· You develop pain, swelling, or redness of your leg.  °· You develop heavy vaginal bleeding with or without blood clots.  °Document Released: 07/06/2002 Document Revised: 06/26/2011 Document Reviewed: 01/09/2011 °ExitCare® Patient Information ©2012 [employer]. °

## 2011-12-22 NOTE — Discharge Summary (Signed)
Physician Discharge Summary  Patient ID: Leah Schneider MRN: [id number] DOB/AGE: 05-21-1986 26 y.o.  Admit date: 12/14/2011 Discharge date: 12/22/2011  Admission Diagnoses: 30+ weeks with PTL,PROM, intra amniotic bleed Discharge Diagnoses:  As above+ s/p caesarean section for breech  Discharged Condition: stable  Hospital Course: see progress notes  Consults: None  Significant Diagnostic Studies: sonograms  Treatments: surgery: c/s  Discharge Exam: Blood pressure 129/86, pulse 92, temperature 98.3 F (36.8 C), temperature source Oral, resp. rate 18, height 5' 5.25" (1.657 m), weight 229 lb 14.4 oz (104.282 kg), last menstrual period 05/17/2011, SpO2 97.00%, unknown if currently breastfeeding. General appearance: alert, cooperative and no distress GI: soft, non-tender; bowel sounds normal; no masses,  no organomegaly Incision/Wound:clean dry intact  Disposition: 01-Home or Self Care  Discharge Orders    Future Orders Please Complete By Expires   Diet - low sodium heart healthy      Increase activity slowly      Discharge instructions      Comments:   Follow up with Dr Despina Hidden on Thursday 12/26/2011   No dressing needed      Discharge wound care:      Comments:   Keep clean and dry    Call MD for:  temperature >100.4      Call MD for:  persistant nausea and vomiting      Call MD for:  severe uncontrolled pain      HIV antibody      Comments:   This external order was created through the Results Console.   GC/chlamydia probe amp, genital      Comments:   This external order was created through the Results Console.   Rubella antibody, IgM      Comments:   This external order was created through the Results Console.   RPR      Comments:   This external order was created through the Results Console.   Antibody screen      Comments:   This external order was created through the Results Console.   ABO/Rh      Comments:   This external order was created through the  Results Console.     Medication List  As of 12/22/2011 11:22 AM   STOP taking these medications         NIFEdipine 30 MG 24 hr tablet         TAKE these medications         ibuprofen 600 MG tablet   Commonly known as: ADVIL,MOTRIN   Take 1 tablet (600 mg total) by mouth every 6 (six) hours.      oxyCODONE-acetaminophen 5-325 MG per tablet   Commonly known as: PERCOCET   Take 1-2 tablets by mouth every 3 (three) hours as needed (moderate - severe pain).      prenatal multivitamin Tabs   Take 1 tablet by mouth daily.           Follow-up Information    Follow up with Lazaro Arms, MD on 12/26/2011.   Contact information:   [facility] [address] [place] [address] [phone]          Signed: Lazaro Arms 12/22/2011, 11:22 AM

## 2011-12-22 NOTE — Progress Notes (Signed)
Pt d/c home with family, via wc to private car. D/C instructions and prescriptions reviewed with pt. Pt verbalized understanding.  

## 2012-10-28 NOTE — L&D Delivery Note (Signed)
Patient was C/C and pushed for 5 minutes with epidural.   NSVD  Female infant, Apgars 8/9, weight 7#4.   The patient had a small first degree perineal repaired with 2-0 vicryl Fundus was firm. EBL was expected. Placenta was delivered intact. Vagina was clear.  Baby was vigorous and doing skin to skin with mother.  Philip Aspen

## 2012-11-17 ENCOUNTER — Other Ambulatory Visit: Payer: Self-pay | Admitting: Endocrinology

## 2012-11-17 DIAGNOSIS — R946 Abnormal results of thyroid function studies: Secondary | ICD-10-CM

## 2012-11-18 ENCOUNTER — Other Ambulatory Visit: Payer: Self-pay

## 2012-11-19 ENCOUNTER — Other Ambulatory Visit: Payer: Self-pay

## 2012-11-19 ENCOUNTER — Ambulatory Visit
Admission: RE | Admit: 2012-11-19 | Discharge: 2012-11-19 | Disposition: A | Discharge status: Home / Self Care | Payer: BC Managed Care – PPO | Source: Ambulatory Visit | Attending: Endocrinology | Admitting: Endocrinology

## 2012-11-19 DIAGNOSIS — R946 Abnormal results of thyroid function studies: Secondary | ICD-10-CM

## 2012-11-23 ENCOUNTER — Other Ambulatory Visit: Payer: Self-pay | Admitting: Endocrinology

## 2012-11-23 DIAGNOSIS — E041 Nontoxic single thyroid nodule: Secondary | ICD-10-CM

## 2012-11-26 ENCOUNTER — Ambulatory Visit
Admission: RE | Admit: 2012-11-26 | Discharge: 2012-11-26 | Disposition: A | Discharge status: Home / Self Care | Payer: BC Managed Care – PPO | Source: Ambulatory Visit | Attending: Endocrinology | Admitting: Endocrinology

## 2012-11-26 ENCOUNTER — Other Ambulatory Visit (HOSPITAL_COMMUNITY)
Admission: RE | Admit: 2012-11-26 | Discharge: 2012-11-26 | Disposition: A | Discharge status: Home / Self Care | Payer: BC Managed Care – PPO | Source: Ambulatory Visit | Attending: Interventional Radiology | Admitting: Interventional Radiology

## 2012-11-26 DIAGNOSIS — E049 Nontoxic goiter, unspecified: Secondary | ICD-10-CM | POA: Insufficient documentation

## 2012-11-26 DIAGNOSIS — E041 Nontoxic single thyroid nodule: Secondary | ICD-10-CM

## 2012-12-02 ENCOUNTER — Other Ambulatory Visit: Payer: Self-pay

## 2013-03-01 ENCOUNTER — Other Ambulatory Visit: Payer: Self-pay | Admitting: Obstetrics and Gynecology

## 2013-03-01 LAB — OB RESULTS CONSOLE RUBELLA ANTIBODY, IGM: Rubella: IMMUNE

## 2013-03-01 LAB — OB RESULTS CONSOLE ABO/RH: RH Type: POSITIVE

## 2013-03-01 LAB — OB RESULTS CONSOLE HIV ANTIBODY (ROUTINE TESTING): HIV: NONREACTIVE

## 2013-03-01 LAB — OB RESULTS CONSOLE GC/CHLAMYDIA: Gonorrhea: NEGATIVE

## 2013-03-21 IMAGING — US US OB COMP +14 WK
1 series · 12 of 28 positions shown · non-contrast
Comparison: none

[Series 1: us ob comp +14 wk · 38 acquisitions, 12 frames shown]
[im 2/38]
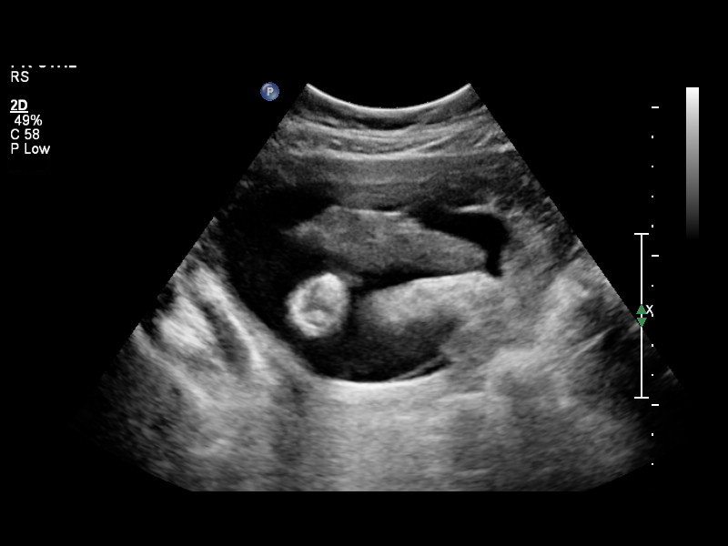
[im 5/38]
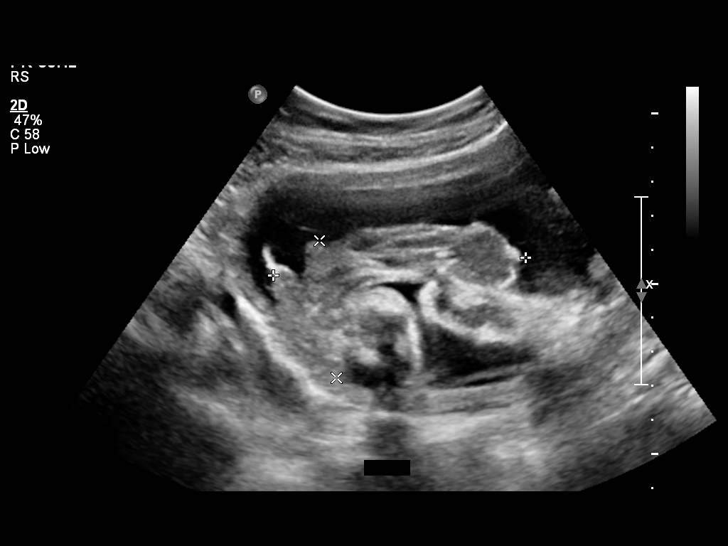
[im 7/38]
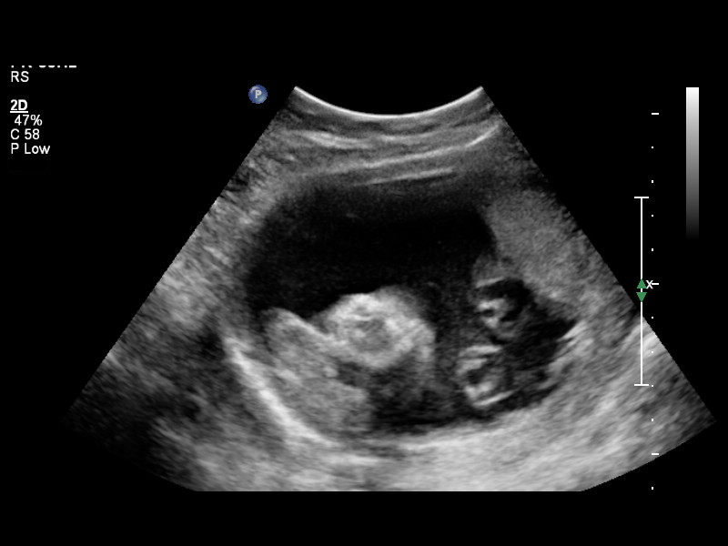
[im 11/38]
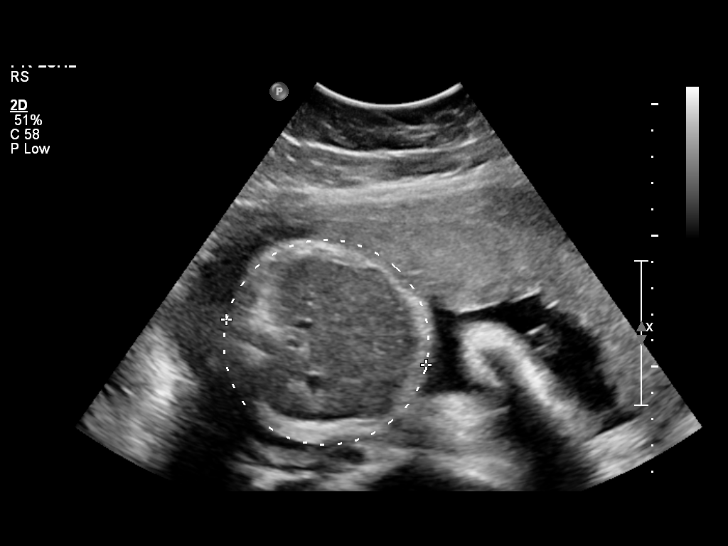
[im 14/38]
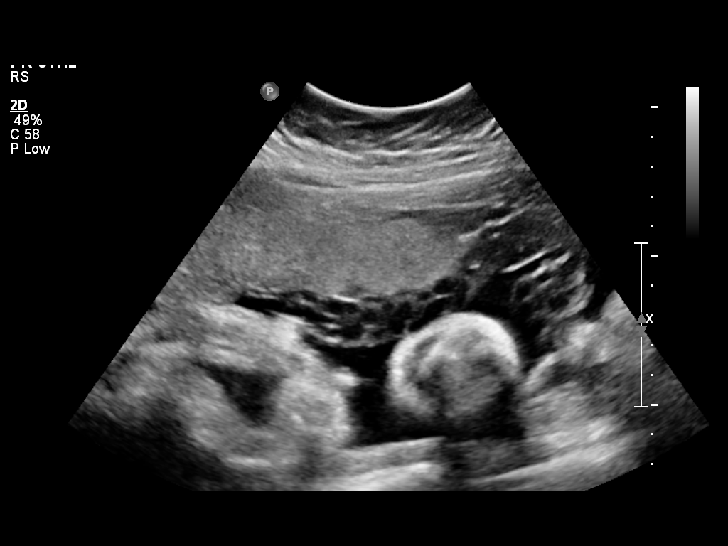
[im 17/38]
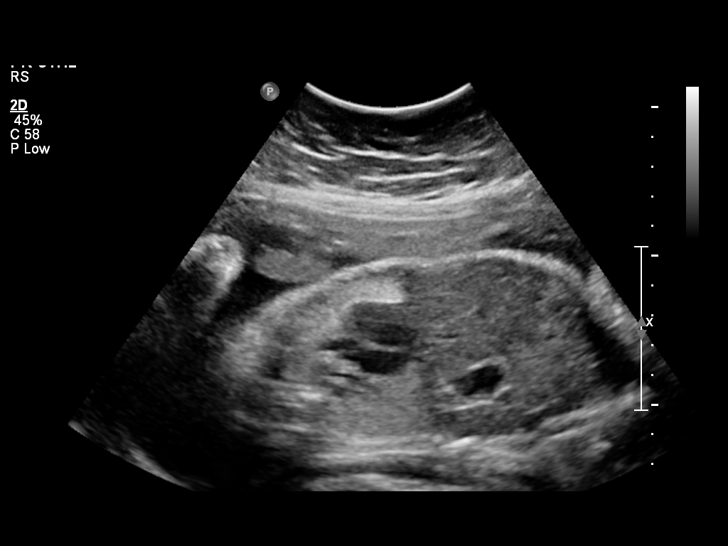
[im 21/38]
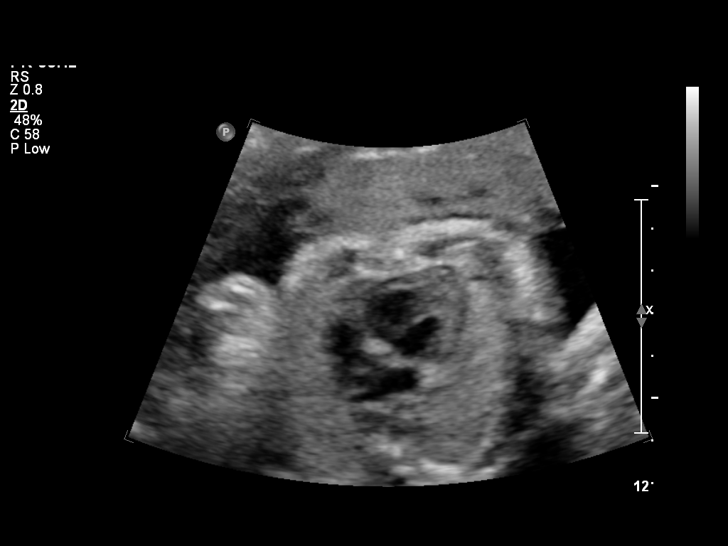
[im 24/38]
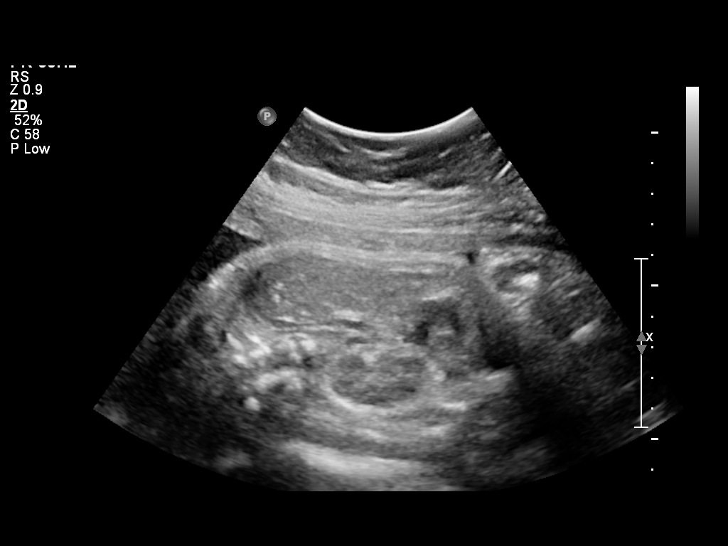
[im 27/38]
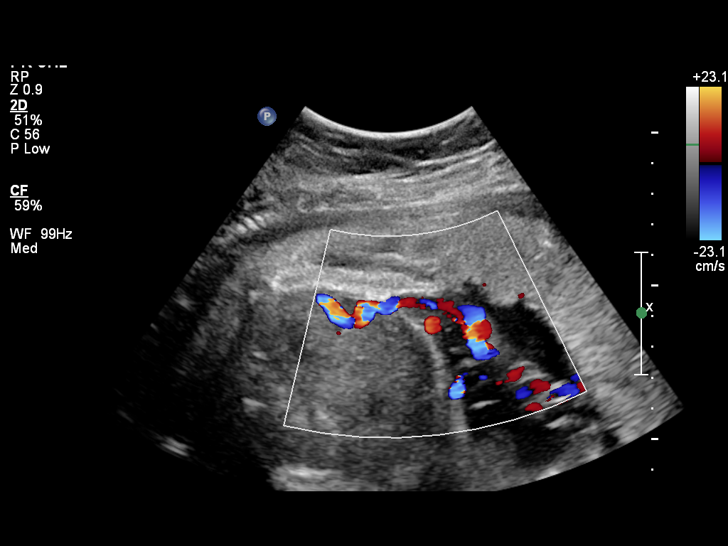
[im 31/38]
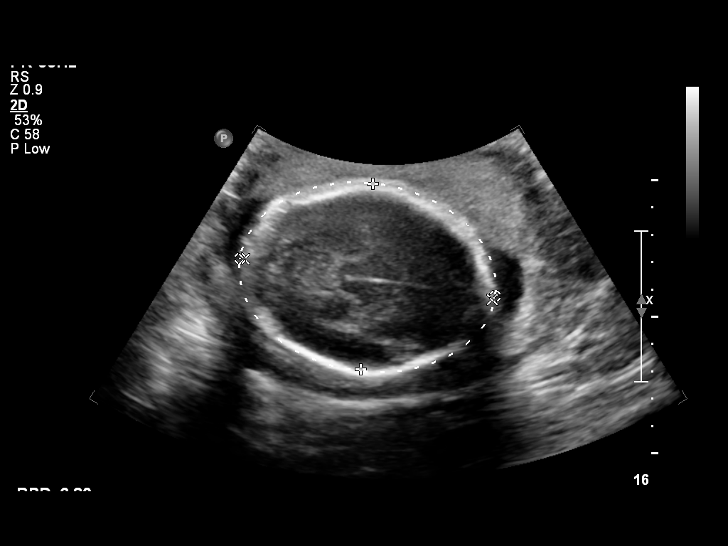
[im 33/38]
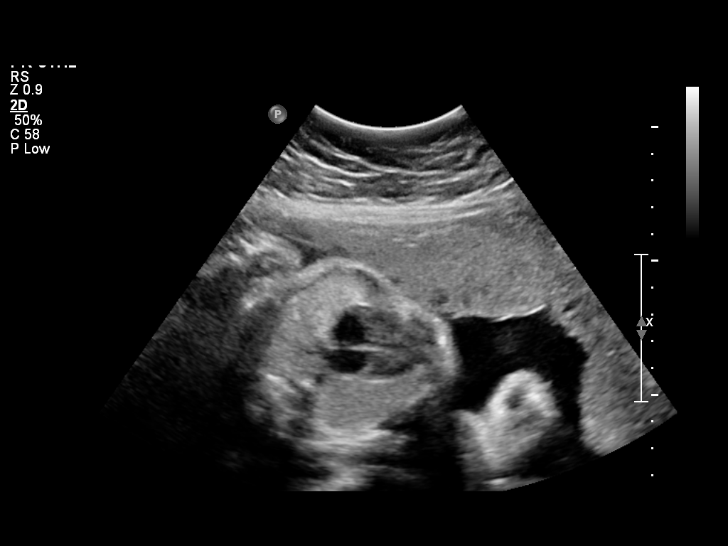
[im 36/38]
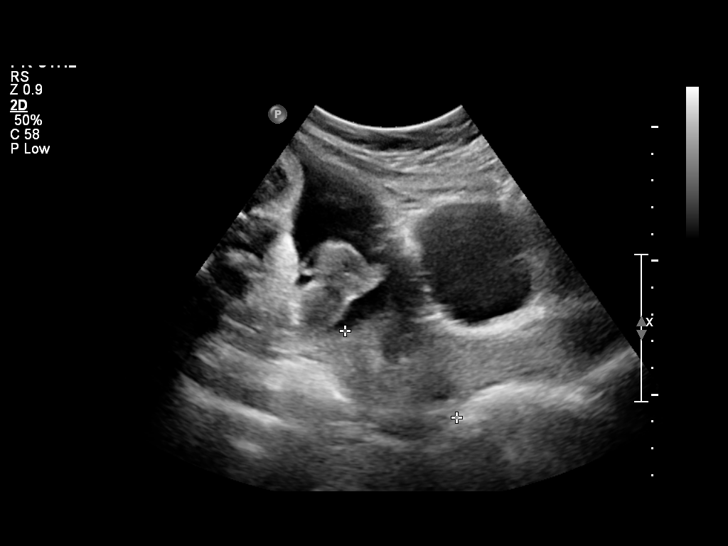

[12 of 28 positions shown; findings below may reference images not displayed]

OBSTETRICS REPORT
                      (Signed Final 12/13/2011 [DATE])

Procedures

 US OB COMP + 14 WK                                    76805.1
Indications

 Assess Fetal Growth / Estimated Fetal Weight
 2 vessel umbilical cord
Fetal Evaluation

 Fetal Heart Rate:  142                          bpm
 Cardiac Activity:  Observed
 Presentation:      Breech, footling
 Placenta:          Anterior, above cervical os

 Amniotic Fluid
 AFI FV:      Subjectively within normal limits
 AFI Sum:     18.29   cm       69  %Tile     Larg Pckt:    6.27  cm
 RUQ:   5.93    cm   RLQ:    6.27   cm    LUQ:   2.74    cm   LLQ:    3.35   cm
Biometry

 BPD:     68.6  mm     G. Age:  27w 4d                CI:         74.6   70 - 86
 OFD:     91.9  mm                                    FL/HC:      19.5   19.2 -

 HC:     258.3  mm     G. Age:  28w 0d      < 3  %    HC/AC:      1.05   0.99 -

 AC:       246  mm     G. Age:  28w 6d       16  %    FL/BPD:     73.5   71 - 87
 FL:      50.4  mm     G. Age:  27w 0d      < 3  %    FL/AC:      20.5   20 - 24

 Est. FW:    2203  gm      2 lb 9 oz     19  %
Gestational Age

 LMP:           30w 0d        Date:  05/17/11                 EDD:   02/21/12
 U/S Today:     27w 6d                                        EDD:   03/07/12
 Best:          30w 0d     Det. By:  LMP  (05/17/11)          EDD:   02/21/12
Anatomy

 Cranium:           Appears normal      Aortic Arch:       Not well
                                                           visualized
 Fetal Cavum:       Appears normal      Ductal Arch:       Not well
                                                           visualized
 Ventricles:        Appears normal      Diaphragm:         Appears normal
 Choroid Plexus:    Not well            Stomach:           Appears
                    visualized                             normal, left
                                                           sided
 Cerebellum:        Appears normal      Abdomen:           Appears normal
 Posterior Fossa:   Not well            Abdominal Wall:    Appears nml
                    visualized                             (cord insert,
                                                           abd wall)
 Nuchal Fold:       Not applicable      Cord Vessels:      2 vessel cord,
                    (>20 wks GA)
                                                           absent Bruno Tiago
                                                           Masahira Teodoro
 Face:              Not well            Kidneys:           Appear normal
                    visualized
 Heart:             Appears normal      Bladder:           Appears normal
                    (4 chamber &
                    axis)
 RVOT:              Not well            Spine:             Not well
                    visualized                             visualized
 LVOT:              Not well            Limbs:             Abnormal, see
                    visualized
                                                           comments
Cervix Uterus Adnexa

 Cervical Length:    5.26     cm

 Cervix:       Normal appearance by transabdominal scan.
Impression

 1. Single living IUP with assigned GA of 30w 0d. EGA by
 ultrasound is 2w 1d behind assigned GA. The EFW is at the
 19th percentile for assigned GA. Follow-up ulotrasound to
 assess fetal growth in 2 to 3 weeks is suggested.
 2. There is a soft tissue mass mesuring approxiately 7.4 x
 cm that appears contiguous with one or both of the fetus's
 feet. A congenital hemangioma would be a consideration, but
 a definite etiology of this mass is uncertain. Matenal fetal
 medicine consult is suggested.
 3. Two vessel umbilical cord.
 4. The remainder of the fetal anatomy appears normal.
 5. Normal amniotic fluid volume and cervical length.

 questions or concerns.

## 2013-10-08 ENCOUNTER — Encounter (HOSPITAL_COMMUNITY): Payer: Self-pay | Admitting: *Deleted

## 2013-10-08 ENCOUNTER — Inpatient Hospital Stay (HOSPITAL_COMMUNITY)
Admission: AD | Admit: 2013-10-08 | Discharge: 2013-10-08 | Disposition: A | Discharge status: Home / Self Care | Payer: BC Managed Care – PPO | Source: Ambulatory Visit | Attending: Obstetrics and Gynecology | Admitting: Obstetrics and Gynecology

## 2013-10-08 ENCOUNTER — Inpatient Hospital Stay (HOSPITAL_COMMUNITY)
Admission: AD | Admit: 2013-10-08 | Discharge: 2013-10-10 | DRG: 775 | Disposition: A | Discharge status: Home / Self Care | Payer: BC Managed Care – PPO | Source: Ambulatory Visit | Attending: Obstetrics and Gynecology | Admitting: Obstetrics and Gynecology

## 2013-10-08 DIAGNOSIS — O34219 Maternal care for unspecified type scar from previous cesarean delivery: Secondary | ICD-10-CM | POA: Diagnosis present

## 2013-10-08 DIAGNOSIS — O479 False labor, unspecified: Secondary | ICD-10-CM | POA: Insufficient documentation

## 2013-10-08 DIAGNOSIS — IMO0001 Reserved for inherently not codable concepts without codable children: Secondary | ICD-10-CM

## 2013-10-08 NOTE — MAU Note (Signed)
Patient presents with complaint of contractions since 1500 today.

## 2013-10-09 ENCOUNTER — Encounter (HOSPITAL_COMMUNITY): Payer: Self-pay | Admitting: *Deleted

## 2013-10-09 DIAGNOSIS — IMO0001 Reserved for inherently not codable concepts without codable children: Secondary | ICD-10-CM

## 2013-10-09 LAB — CBC
HCT: 35.2 % — ABNORMAL LOW (ref 36.0–46.0)
Hemoglobin: 11.4 g/dL — ABNORMAL LOW (ref 12.0–15.0)
MCH: 25 pg — ABNORMAL LOW (ref 26.0–34.0)
MCH: 24.8 pg — ABNORMAL LOW (ref 26.0–34.0)
MCHC: 32.4 g/dL (ref 30.0–36.0)
MCHC: 32.2 g/dL (ref 30.0–36.0)
Platelets: 206 10*3/uL (ref 150–400)
RBC: 4.56 MIL/uL (ref 3.87–5.11)
RBC: 4.51 MIL/uL (ref 3.87–5.11)
RDW: 15.6 % — ABNORMAL HIGH (ref 11.5–15.5)
WBC: 15.9 10*3/uL — ABNORMAL HIGH (ref 4.0–10.5)

## 2013-10-09 LAB — RPR: RPR Ser Ql: NONREACTIVE

## 2013-10-09 MED ORDER — LIDOCAINE HCL (PF) 1 % IJ SOLN
30.0000 mL | INTRAMUSCULAR | Status: DC | PRN
  Administered 2013-10-09: 30 mL via SUBCUTANEOUS
  Filled 2013-10-09: qty 30

## 2013-10-09 MED ORDER — ONDANSETRON HCL 4 MG/2ML IJ SOLN: 4.0000 mg | INTRAMUSCULAR | Status: DC | PRN

## 2013-10-09 MED ORDER — OXYTOCIN 40 UNITS IN LACTATED RINGERS INFUSION - SIMPLE MED
INTRAVENOUS | Status: AC
  Filled 2013-10-09: qty 1000

## 2013-10-09 MED ORDER — IBUPROFEN 600 MG PO TABS
600.0000 mg | ORAL_TABLET | Freq: Four times a day (QID) | ORAL | Status: DC
  Administered 2013-10-09 – 2013-10-10 (×6): 600 mg via ORAL
  Filled 2013-10-09 (×5): qty 1

## 2013-10-09 MED ORDER — DIBUCAINE 1 % RE OINT: 1.0000 "application " | TOPICAL_OINTMENT | RECTAL | Status: DC | PRN

## 2013-10-09 MED ORDER — BUTORPHANOL TARTRATE 1 MG/ML IJ SOLN
1.0000 mg | Freq: Once | INTRAMUSCULAR | Status: AC
  Administered 2013-10-09: 1 mg via INTRAVENOUS
  Filled 2013-10-09: qty 1

## 2013-10-09 MED ORDER — ACETAMINOPHEN 325 MG PO TABS: 650.0000 mg | ORAL_TABLET | ORAL | Status: DC | PRN

## 2013-10-09 MED ORDER — SIMETHICONE 80 MG PO CHEW: 80.0000 mg | CHEWABLE_TABLET | ORAL | Status: DC | PRN

## 2013-10-09 MED ORDER — ONDANSETRON HCL 4 MG/2ML IJ SOLN: 4.0000 mg | Freq: Four times a day (QID) | INTRAMUSCULAR | Status: DC | PRN

## 2013-10-09 MED ORDER — BENZOCAINE-MENTHOL 20-0.5 % EX AERO
1.0000 "application " | INHALATION_SPRAY | CUTANEOUS | Status: DC | PRN
  Filled 2013-10-09 (×2): qty 56

## 2013-10-09 MED ORDER — ZOLPIDEM TARTRATE 5 MG PO TABS: 5.0000 mg | ORAL_TABLET | Freq: Every evening | ORAL | Status: DC | PRN

## 2013-10-09 MED ORDER — OXYCODONE-ACETAMINOPHEN 5-325 MG PO TABS
1.0000 | ORAL_TABLET | ORAL | Status: DC | PRN
  Administered 2013-10-09: 1 via ORAL
  Filled 2013-10-09: qty 1

## 2013-10-09 MED ORDER — ONDANSETRON HCL 4 MG PO TABS: 4.0000 mg | ORAL_TABLET | ORAL | Status: DC | PRN

## 2013-10-09 MED ORDER — SENNOSIDES-DOCUSATE SODIUM 8.6-50 MG PO TABS
2.0000 | ORAL_TABLET | ORAL | Status: DC
  Administered 2013-10-10: 2 via ORAL
  Filled 2013-10-09: qty 2

## 2013-10-09 MED ORDER — INFLUENZA VAC SPLIT QUAD 0.5 ML IM SUSP
0.5000 mL | INTRAMUSCULAR | Status: AC
  Administered 2013-10-10: 0.5 mL via INTRAMUSCULAR

## 2013-10-09 MED ORDER — TETANUS-DIPHTH-ACELL PERTUSSIS 5-2.5-18.5 LF-MCG/0.5 IM SUSP
0.5000 mL | Freq: Once | INTRAMUSCULAR | Status: AC
  Administered 2013-10-10: 0.5 mL via INTRAMUSCULAR

## 2013-10-09 MED ORDER — CITRIC ACID-SODIUM CITRATE 334-500 MG/5ML PO SOLN: 30.0000 mL | ORAL | Status: DC | PRN

## 2013-10-09 MED ORDER — IBUPROFEN 600 MG PO TABS
600.0000 mg | ORAL_TABLET | Freq: Four times a day (QID) | ORAL | Status: DC | PRN
  Administered 2013-10-09: 600 mg via ORAL
  Filled 2013-10-09: qty 1

## 2013-10-09 MED ORDER — PRENATAL MULTIVITAMIN CH
1.0000 | ORAL_TABLET | Freq: Every day | ORAL | Status: DC
  Administered 2013-10-09 – 2013-10-10 (×2): 1 via ORAL
  Filled 2013-10-09 (×2): qty 1

## 2013-10-09 MED ORDER — LACTATED RINGERS IV SOLN: 500.0000 mL | INTRAVENOUS | Status: DC | PRN

## 2013-10-09 MED ORDER — FLEET ENEMA 7-19 GM/118ML RE ENEM: 1.0000 | ENEMA | RECTAL | Status: DC | PRN

## 2013-10-09 MED ORDER — OXYTOCIN 40 UNITS IN LACTATED RINGERS INFUSION - SIMPLE MED: 62.5000 mL/h | INTRAVENOUS | Status: DC

## 2013-10-09 MED ORDER — WITCH HAZEL-GLYCERIN EX PADS: 1.0000 "application " | MEDICATED_PAD | CUTANEOUS | Status: DC | PRN

## 2013-10-09 MED ORDER — DIPHENHYDRAMINE HCL 25 MG PO CAPS: 25.0000 mg | ORAL_CAPSULE | Freq: Four times a day (QID) | ORAL | Status: DC | PRN

## 2013-10-09 MED ORDER — LACTATED RINGERS IV SOLN: INTRAVENOUS | Status: DC

## 2013-10-09 MED ORDER — OXYTOCIN BOLUS FROM INFUSION
500.0000 mL | INTRAVENOUS | Status: DC
  Administered 2013-10-09: 500 mL via INTRAVENOUS

## 2013-10-09 MED ORDER — LIDOCAINE HCL (PF) 1 % IJ SOLN
INTRAMUSCULAR | Status: AC
  Administered 2013-10-09: 30 mL via SUBCUTANEOUS
  Filled 2013-10-09: qty 30

## 2013-10-09 MED ORDER — OXYCODONE-ACETAMINOPHEN 5-325 MG PO TABS
1.0000 | ORAL_TABLET | ORAL | Status: DC | PRN
Start: 2013-10-09 — End: 2013-10-10

## 2013-10-09 MED ORDER — LANOLIN HYDROUS EX OINT: TOPICAL_OINTMENT | CUTANEOUS | Status: DC | PRN

## 2013-10-09 NOTE — Lactation Note (Signed)
This note was copied from the chart of Leah Paediatric nurse. Lactation Consultation Note  Patient Name: Leah Schneider Today's Date: 10/09/2013 Reason for consult: Follow-up assessment;Difficult latch RN initiated a #24 nipple shield because Mom could not latch baby and Mom/baby were becoming frustrated. RN reports she assisted Mom for 15 minutes and Baby could not obtain a good latch. Baby latched easily with the nipple shield and was demonstrating a good rhythmic suck when LC came in to help. LC changed nipple shield to #20, lots of colostrum present in the nipple shield when baby came off the breast. RN set up DEBP for Mom to post pump after feedings. Cluster feeding reviewed with Mom. Encouraged to ask for assist as needed.   Maternal Data Formula Feeding for Exclusion: No Infant to breast within first hour of birth: Yes Has patient been taught Hand Expression?: Yes Does the patient have breastfeeding experience prior to this delivery?: Yes  Feeding Feeding Type: Breast Fed Length of feed: 0 min  LATCH Score/Interventions Latch: Grasps breast easily, tongue down, lips flanged, rhythmical sucking. Intervention(s): Skin to skin Intervention(s): Adjust position;Assist with latch;Breast compression  Audible Swallowing: Spontaneous and intermittent Intervention(s): Skin to skin  Type of Nipple: Flat Intervention(s):  (shield #24)  Comfort (Breast/Nipple): Soft / non-tender     Hold (Positioning): No assistance needed to correctly position infant at breast. Intervention(s): Breastfeeding basics reviewed;Support Pillows;Position options;Skin to skin  LATCH Score: 9  Lactation Tools Discussed/Used Tools: Nipple Dorris Carnes;Pump Nipple shield size: 20;24 WIC Program: No Initiated by:: RN Date initiated:: 10/09/13   Consult Status Consult Status: Follow-up Date: 10/10/13 Follow-up type: In-patient    Alfred Levins 10/09/2013, 7:16 PM

## 2013-10-09 NOTE — Progress Notes (Signed)
Pt presented in MAU very uncomfortable reported her water broke. SVE 7/100/-1/0

## 2013-10-09 NOTE — H&P (Signed)
27 y.o. [redacted]w[redacted]d  [id number] comes in c/o SROM @11 :30pm, painful ctx.  Otherwise has good fetal movement and no bleeding.  Past Medical History  Diagnosis Date  . Herpes   . No pertinent past medical history     Past Surgical History  Procedure Laterality Date  . Cesarean section    . Cesarean section  12/19/2011    Procedure: CESAREAN SECTION;  Surgeon: Tilda Burrow, MD;  Location: [facility];  Service: Gynecology;  Laterality: N/A;    OB History  Gravida Para Term Preterm AB SAB TAB Ectopic Multiple Living  5 2 1 1 2 2  0 0 0 2    # Outcome Date GA Lbr Len/2nd Weight Sex Delivery Anes PTL Lv  5 CUR           4 PRE 12/19/11 [redacted]w[redacted]d  1.32 kg (2 lb 14.6 oz) F LTCS Spinal  Y     Comments: none  3 TRM           2 SAB           1 SAB               History   Social History  . Marital Status: Married    Spouse Name: N/A    Number of Children: N/A  . Years of Education: N/A   Occupational History  . Not on file.   Social History Main Topics  . Smoking status: Never Smoker   . Smokeless tobacco: Never Used  . Alcohol Use: No  . Drug Use: No  . Sexual Activity: Yes    Birth Control/ Protection: None   Other Topics Concern  . Not on file   Social History Narrative  . No narrative on file   Review of patient's allergies indicates no known allergies.    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  Use of 17-OHP for prior PPROM/PTD at ~32weeks with fetal death after discharge from NICU    There were no vitals filed for this visit.   Lungs/Cor:  NAD Abdomen:  soft, gravid Ex:  no cords, erythema SVE:  7/100 FHTs:  Short  trace, baseline difficult to assess, early decels with ctx Toco:  q2   A/P   Admit to L&D for imminent delivery  On exam, no HSV lesions  GBS neg  Jurgen Groeneveld

## 2013-10-09 NOTE — Progress Notes (Signed)
Patient is eating, ambulating, voiding.  Pain control is good.  Appropriate lochia.  No complaints.  Filed Vitals:   10/09/13 0134 10/09/13 0259 10/09/13 0405 10/09/13 0810  BP: 125/75 116/74 130/80 120/76  Pulse: 84 89 88 70  Temp:  98.5 F (36.9 C) 97.8 F (36.6 C) 97.6 F (36.4 C)  TempSrc:  Oral Oral   Resp:  18 18 20   Height:      Weight:      SpO2:  98%      Fundus firm Perineum without swelling. No CT  Lab Results  Component Value Date   WBC 16.3* 10/09/2013   HGB 11.2* 10/09/2013   HCT 34.8* 10/09/2013   MCV 77.2* 10/09/2013   PLT 206 10/09/2013    --/--/O POS (12/13 0115)  A/P Post partum day 1. Circ desired.  Routine care.  Expect d/c 12/14.    Leah Schneider

## 2013-10-10 NOTE — Discharge Summary (Signed)
Obstetric Discharge Summary Reason for Admission: onset of labor and rupture of membranes Prenatal Procedures: ultrasound Intrapartum Procedures: spontaneous vaginal delivery Postpartum Procedures: none Complications-Operative and Postpartum: small 2nd degree Hemoglobin  Date Value Range Status  10/09/2013 11.2* 12.0 - 15.0 g/dL Final     HCT  Date Value Range Status  10/09/2013 34.8* 36.0 - 46.0 % Final    Physical Exam:  General: alert and cooperative Lochia: appropriate Uterine Fundus: firm DVT Evaluation: No evidence of DVT seen on physical exam.  Discharge Diagnoses: Term Pregnancy-delivered  Discharge Information: Date: 10/10/2013 Activity: pelvic rest Diet: routine Medications: PNV and Ibuprofen Condition: stable Instructions: refer to practice specific booklet Discharge to: home Follow-up Information   Follow up with Leah Munce, DO In 4 weeks.   Specialty:  Obstetrics and Gynecology   Contact information:   [address] [place] [address] [phone]       Newborn Data: Live born female  Birth Weight: 7 lb 4.6 oz (3305 g) APGAR: 9, 9  Home with mother.  Leah Schneider 10/10/2013, 11:31 AM

## 2013-10-12 ENCOUNTER — Encounter (HOSPITAL_COMMUNITY): Payer: Self-pay | Admitting: *Deleted

## 2013-10-18 ENCOUNTER — Ambulatory Visit: Payer: Self-pay

## 2013-10-18 NOTE — Lactation Note (Signed)
This note was copied from the chart of [employer]ornell [facility]or[facility] Lactation Consultation Outpatient Visit Note  Patient Name: Leah Schneider. Date of Birth: 10/09/2015 Birth Weight:  7 lb 4.6 oz (3305 g) Gestational Age at Delivery: Gestational Age: [redacted]w[redacted]d Type of Delivery: NVD BIRTH WEIGHT: 7-4.6 DISCHARGE WEIGHT: 7-0.4 WEIGHT TODAY: 7-2.9 Breastfeeding History Frequency of Breastfeeding: EVERY 2-3 HOURS DURING DAY, EVERY 5-6 HOURS AT NIGHT Length of Feeding: 15-20 MINUTES PER BREAST Voids: QS Stools: LAST STOOL GREEN 2 DAYS AGO  Supplementing / Method:BOTTLE EBM 2-3 OZ/1-2 TIMES PER DAY Pumping:  Type of Pump:EVEN FLOW   Frequency:4-5 TIMES PER DAY  Volume:  RIGHT 2-4 OZ  RIGHT  4-5 OZ LEFT  Comments:    Consultation Evaluation:Mom and 9 day infant here for feeding assessment.  Baby had a difficult time latching in the hospital and a 20 mm nipple shield was started.  Mom has been breastfeeding with shield but both nipples slightly red and blood blister on left nipple.  Observed baby latch with 20 mm nipple shield and latch does not appear deep.  Baby taken off breast and nipple looks slightly pinched.  Nipple shield changed to 24 mm shield and baby latched easily and latch deeper.  Mom states latch feels more comfortable.  Baby sleepy at first and needs good stimulation and breast massage to actively nurse.  Baby responds well and good audible swallows heard.  Baby nursed for 20 minutes and transferred 88 mls then content and relaxed.  Baby has not stooled in 2 days and last stool was green.  Observed a soaking wet diaper in office.  Baby has gained.9 oz in 5 days.  Discussed by changing shield size and using good breast massage and compression during feeding weight gain should improve along with increased stools.  Recommended mom continue to post pump until weight gain has improved.  Mom to see pedi today.  Comfort gels given with instructions Initial Feeding Assessment: LEFT BREAST 20  MINUTES Pre-feed [id number] Post-feed [id number] Amount Transferred:88 MLS Comments:  Additional Feeding Assessment: Pre-feed Weight: Post-feed Weight: Amount Transferred: Comments:  Additional Feeding Assessment: Pre-feed Weight: Post-feed Weight: Amount Transferred: Comments:  Total Breast milk Transferred this Visit: 88 MLS Total Supplement Given: NONE  Additional Interventions:   Follow-Up PEDI TODAY AND WEIGHT CHECK WITHIN ONE WEEK      Hansel Feinstein 10/18/2013, 10:11 AM

## 2014-02-26 IMAGING — US US SOFT TISSUE HEAD/NECK
1 series · 13 of 25 positions shown · non-contrast
Comparison: None.

CLINICAL DATA: Soft tissue prominence in the left neck with
possible thyroid goiter and abnormal thyroid function clinically.

THYROID ULTRASOUND
TECHNIQUE: Ultrasound examination of the thyroid gland and adjacent
soft tissues was performed.

[Series 1: us soft tissue head/neck · 0.06mm/px · 13 of 52 slices shown]
[im 1/52]
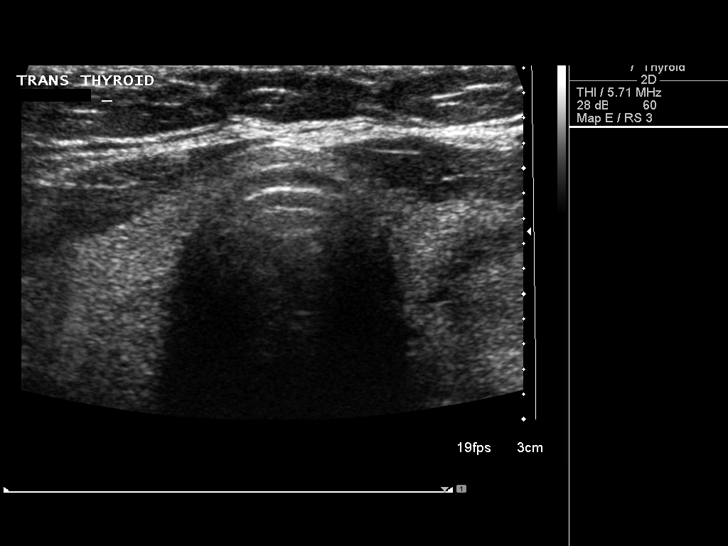
[im 5/52]
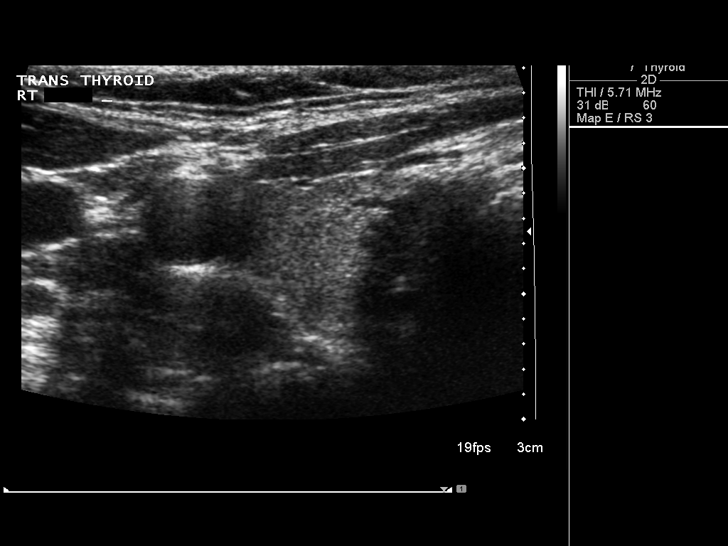
[im 9/52]
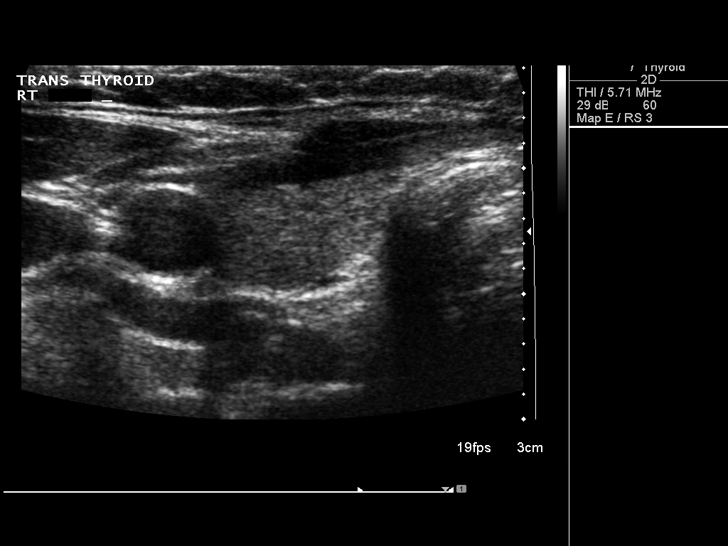
[im 13/52]
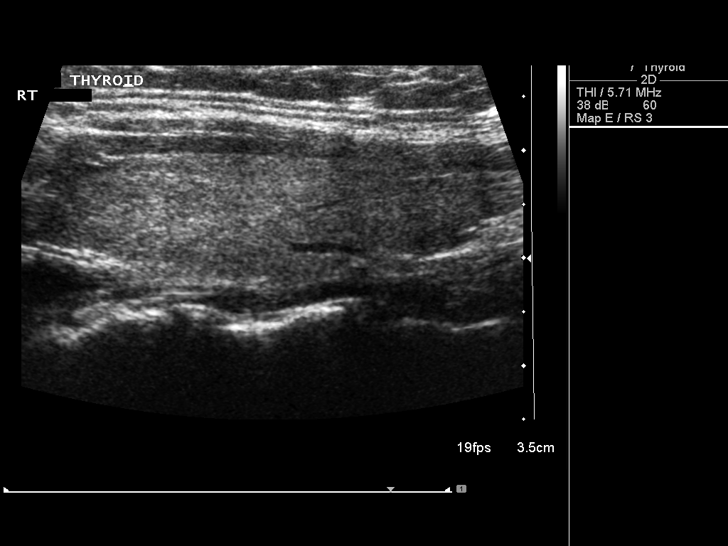
[im 18/52]
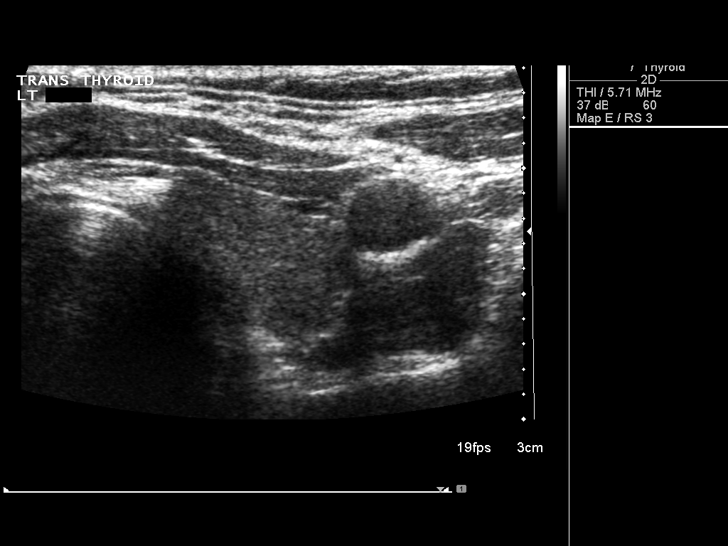
[im 22/52]
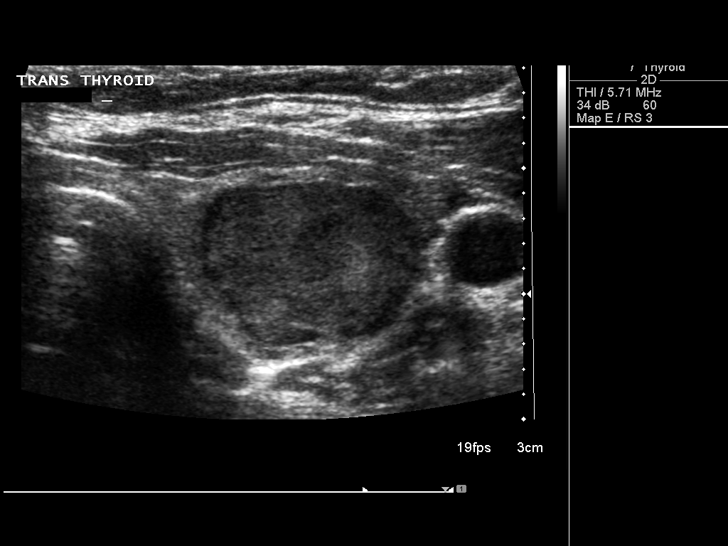
[im 26/52]
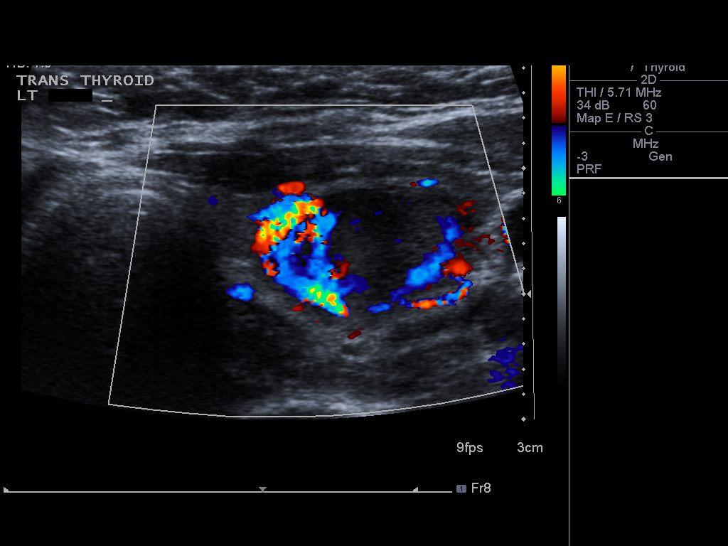
[im 30/52]
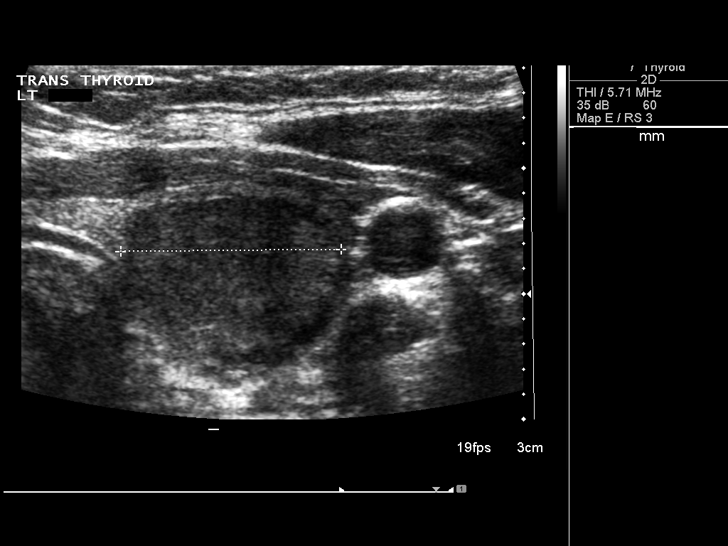
[im 35/52]
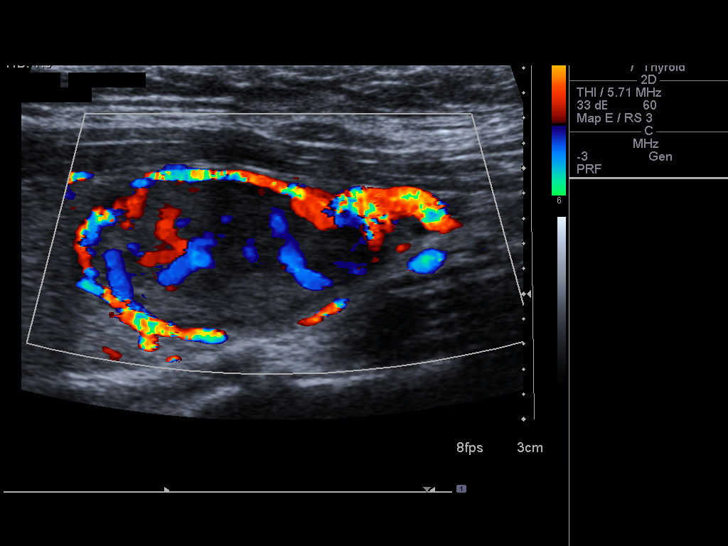
[im 39/52]
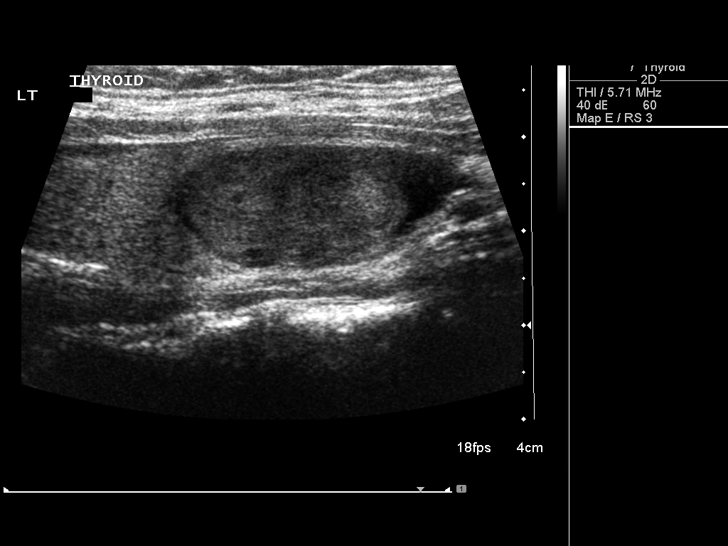
[im 43/52]
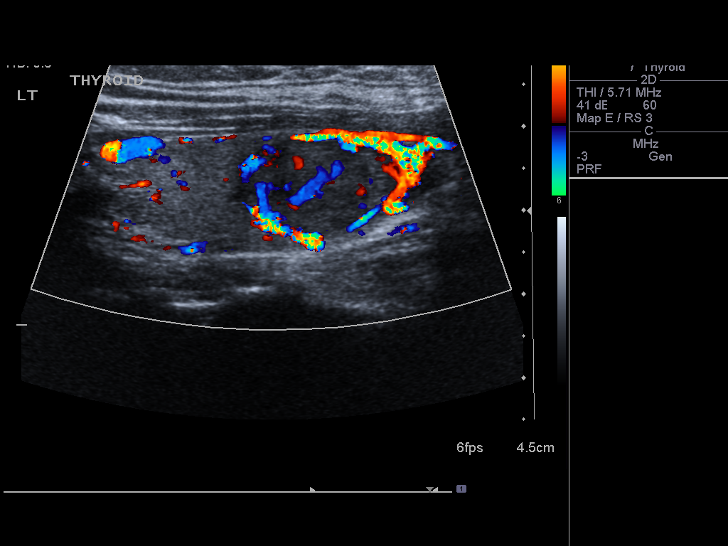
[im 47/52]
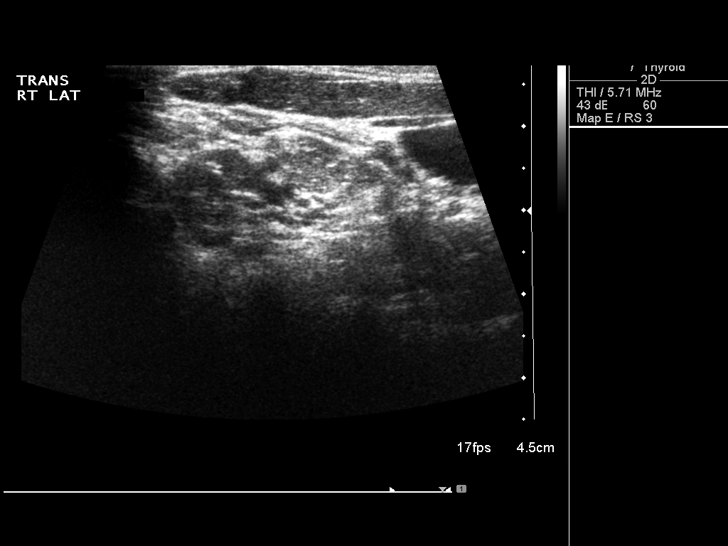
[im 52/52]
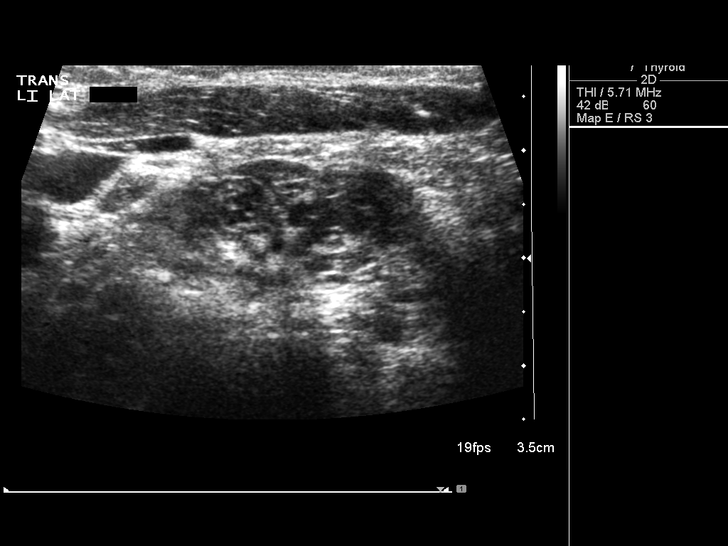

[13 of 25 positions shown; findings below may reference images not displayed]

FINDINGS: Right thyroid lobe:  4.5 x 1.2 x 1.6 cm
Left thyroid lobe:  4.9 x 1.7 x 2.2 cm
Isthmus:  0.2 cm

The thyroid gland is normal in size and shows homogeneous
echotexture.

Focal nodules:  A focal nodule in the inferior left lobe is mostly
solid and shows an inferior cystic component.  Including the cystic
component, the nodule measures approximately 2.9 x 1.4 x 1.8 cm.
The solid portion of the nodule measures approximately 2.4 cm in
greatest diameter.  The nodule does not contain internal
calcifications.  There is some vascular margination by color
Doppler.  No other nodules are identified.

Lymphadenopathy:  None visualized.
IMPRESSION: Dominant nodule in the inferior left thyroid measuring up to 2.9 cm
in greatest diameter when including an inferior cystic component.
This is a well-circumscribed nodule which does not contain internal
calcification.  If there is abnormal thyroid function, this may
represent a functioning adenoma.  Consider additional nuclear
medicine thyroid scintigraphy.  Biopsy could also be considered
given lesion size.

## 2014-08-29 ENCOUNTER — Encounter (HOSPITAL_COMMUNITY): Payer: Self-pay | Admitting: *Deleted

## 2014-11-21 ENCOUNTER — Other Ambulatory Visit: Payer: Self-pay

## 2014-11-22 LAB — CYTOLOGY - PAP

## 2015-12-12 ENCOUNTER — Other Ambulatory Visit: Payer: Self-pay | Admitting: Obstetrics and Gynecology

## 2015-12-14 LAB — CYTOLOGY - PAP

## 2016-02-06 ENCOUNTER — Ambulatory Visit
Admission: RE | Admit: 2016-02-06 | Discharge: 2016-02-06 | Disposition: A | Discharge status: Home / Self Care | Payer: BC Managed Care – PPO | Source: Ambulatory Visit | Attending: Endocrinology | Admitting: Endocrinology

## 2016-02-06 ENCOUNTER — Other Ambulatory Visit: Payer: Self-pay | Admitting: Endocrinology

## 2016-02-06 DIAGNOSIS — E041 Nontoxic single thyroid nodule: Secondary | ICD-10-CM

## 2016-02-07 ENCOUNTER — Other Ambulatory Visit: Payer: BC Managed Care – PPO

## 2016-02-20 ENCOUNTER — Other Ambulatory Visit: Payer: Self-pay | Admitting: Endocrinology

## 2016-02-20 DIAGNOSIS — E041 Nontoxic single thyroid nodule: Secondary | ICD-10-CM

## 2016-04-10 ENCOUNTER — Other Ambulatory Visit (HOSPITAL_COMMUNITY)
Admission: RE | Admit: 2016-04-10 | Discharge: 2016-04-10 | Disposition: A | Discharge status: Home / Self Care | Payer: BC Managed Care – PPO | Source: Ambulatory Visit | Attending: Radiology | Admitting: Radiology

## 2016-04-10 ENCOUNTER — Ambulatory Visit
Admission: RE | Admit: 2016-04-10 | Discharge: 2016-04-10 | Disposition: A | Discharge status: Home / Self Care | Payer: BC Managed Care – PPO | Source: Ambulatory Visit | Attending: Endocrinology | Admitting: Endocrinology

## 2016-04-10 DIAGNOSIS — E041 Nontoxic single thyroid nodule: Secondary | ICD-10-CM | POA: Diagnosis not present

## 2017-01-20 ENCOUNTER — Ambulatory Visit (INDEPENDENT_AMBULATORY_CARE_PROVIDER_SITE_OTHER): Payer: BC Managed Care – PPO | Admitting: Neurology

## 2017-01-20 ENCOUNTER — Encounter: Payer: Self-pay | Admitting: Neurology

## 2017-01-20 VITALS — BP 109/66 | HR 70 | Resp 16 | Ht 65.0 in | Wt 225.0 lb

## 2017-01-20 DIAGNOSIS — R51 Headache: Secondary | ICD-10-CM | POA: Diagnosis not present

## 2017-01-20 DIAGNOSIS — H539 Unspecified visual disturbance: Secondary | ICD-10-CM

## 2017-01-20 DIAGNOSIS — G43001 Migraine without aura, not intractable, with status migrainosus: Secondary | ICD-10-CM

## 2017-01-20 DIAGNOSIS — H5710 Ocular pain, unspecified eye: Secondary | ICD-10-CM

## 2017-01-20 DIAGNOSIS — G43009 Migraine without aura, not intractable, without status migrainosus: Secondary | ICD-10-CM | POA: Insufficient documentation

## 2017-01-20 DIAGNOSIS — H4710 Unspecified papilledema: Secondary | ICD-10-CM

## 2017-01-20 DIAGNOSIS — R519 Headache, unspecified: Secondary | ICD-10-CM

## 2017-01-20 MED ORDER — ELETRIPTAN HYDROBROMIDE 40 MG PO TABS: 40.0000 mg | ORAL_TABLET | ORAL | 12 refills | Status: DC | PRN

## 2017-01-20 NOTE — Progress Notes (Signed)
[facility]    Provider:  Dr Lucia GaskinsAhern Referring Provider: Philip Aspenallahan, Sidney, DO Primary Care Physician:  Philip Aspenallahan, Sidney, DO  CC:  Migraines  HPI:  Leah Schneider is a 31 y.o. female here as a referral from Dr. Claiborne Billingsallahan for migraines. Past medical history migraines without aura, obesity. She has at least one a month usually right before and right after her period. Weather is a trigger. They are in the forehead behind the eyes, can be severe and last days, can be unilateral, pounding/throbbing, light makes it worse and she can;t even focus, she has nausea "here and there", no vomiting. No aura. They start slowly and ramp up, sometimes she wake with it or later in the day no patterns. 3-4 days before her period she will get it and at the end. She recently switched her birth control. She tried imitrex and it didn't work. Tylenol and alleve do not help. Going into a dark room and laying still helps. No other associated symptoms. She is not sleeping well only 6-7 hours. She is in [employer]. She drinks plenty of water. She has 3 kids. Migraines worsening and becoming more frequent with new vision changes.No other focal neurologic deficits, associated symptoms, inciting events or modifiable factors.  Reviewed notes, labs and imaging from outside physicians, which showed:   Reviewed notes from primary care in 2016 when [facility]. She was seen for migraines and had them on and off for years, usually around her periods but getting worse. Migraines can be severe and can last almost an entire day. Usually pain is on the right side or at least just one-sided usually unilateral behind the eyes often times is up the neck as well with pain in the neck and behind the eyes usually she'll get it to a dark room and has to take a nap to help. She'll take ibuprofen and lately been taking Aleve. Often times gets nauseated and feels like she wants to vomit but  typically does not vomit. No aura or visual changes. In the setting of stress. Associated loose bowels. 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 She was started on abortive medication Imitrex 50 mg. She was asked to keep a headache diary. She was also given Zofran for nausea. Obesity and weight loss were discussed. Exam was normal including general, neck, cardiovascular, pulmonary, abdominal, neurologic and skin.  Review of Systems: Patient complains of symptoms per HPI as well as the following symptoms: no CP, no SOB. Pertinent negatives per HPI. All others negative.   Social History   Social History  . Marital status: Married    Spouse name: N/A  . Number of children: 3  . Years of education: college   Occupational History  . Not on file.   Social History Main Topics  . Smoking status: Never Smoker  . Smokeless tobacco: Never Used  . Alcohol use Yes     Comment: Rare  . Drug use: No  . Sexual activity: Yes    Birth control/ protection: None   Other Topics Concern  . Not on file   Social History Narrative   Drinks caffeine occasionally     Family History  Problem Relation Age of Onset  . Heart disease Maternal Grandmother   . Anesthesia problems Neg Hx     Past Medical History:  Diagnosis Date  . Headache   . Herpes   . No  pertinent past medical history     Past Surgical History:  Procedure Laterality Date  . CESAREAN SECTION    . CESAREAN SECTION  12/19/2011   Procedure: CESAREAN SECTION;  Surgeon: Tilda Burrow, MD;  Location: [facility];  Service: Gynecology;  Laterality: N/A;    Current Outpatient Prescriptions  Medication Sig Dispense Refill  . TRI-LEGEST FE 1-20/1-30/1-35 MG-MCG tablet Take 1 tablet by mouth daily.  1  . eletriptan (RELPAX) 40 MG tablet Take 1 tablet (40 mg total) by mouth as needed for migraine or headache. May repeat in 2 hours. Max twice a day. 10 tablet 12   No  current facility-administered medications for this visit.     Allergies as of 01/20/2017  . (No Known Allergies)    Vitals: BP 109/66   Pulse 70   Resp 16   Ht 5\' 5"  (1.651 m)   Wt 225 lb (102.1 kg)   BMI 37.44 kg/m  Last Weight:  Wt Readings from Last 1 Encounters:  01/20/17 225 lb (102.1 kg)   Last Height:   Ht Readings from Last 1 Encounters:  01/20/17 5\' 5"  (1.651 m)   Physical exam: Exam: Gen: NAD, conversant, well nourised, obese, well groomed                     CV: RRR, no MRG. No Carotid Bruits. No peripheral edema, warm, nontender Eyes: Conjunctivae clear without exudates or hemorrhage  Neuro: Detailed Neurologic Exam  Speech:    Speech is normal; fluent and spontaneous with normal comprehension.  Cognition:    The patient is oriented to person, place, and time;     recent and remote memory intact;     language fluent;     normal attention, concentration,     fund of knowledge Cranial Nerves:    The pupils are equal, round, and reactive to light. Blurring of the margins. Visual fields are full to finger confrontation. Extraocular movements are intact. Trigeminal sensation is intact and the muscles of mastication are normal. The face is symmetric. The palate elevates in the midline. Hearing intact. Voice is normal. Shoulder shrug is normal. The tongue has normal motion without fasciculations.   Coordination:    Normal finger to nose and heel to shin. Normal rapid alternating movements.   Gait:    Heel-toe and tandem gait are normal.   Motor Observation:    No asymmetry, no atrophy, and no involuntary movements noted. Tone:    Normal muscle tone.    Posture:    Posture is normal. normal erect    Strength:    Strength is V/V in the upper and lower limbs.      Sensation: intact to LT     Reflex Exam:  DTR's:    Deep tendon reflexes in the upper and lower extremities are normal bilaterally.   Toes:    The toes are downgoing bilaterally.     Clonus:    Clonus is absent.     Assessment/Plan:  31 year old with migraines.  - Eletriptan at onset. Do not take if pregnant. - Discussed preventative such as Topiramate, will hold off at this time - encourage weight loss and good sleep habits - MRI brain due to worsening quality and frequency with new vision changes and blurring of the optic disks: to eval for intracranial etiologies, masses and lesions, intracranial HTN -Labs  Discussed: To prevent or relieve headaches, try the following: Cool Compress. Lie down and place a  cool compress on your head.  Avoid headache triggers. If certain foods or odors seem to have triggered your migraines in the past, avoid them. A headache diary might help you identify triggers.  Include physical activity in your daily routine. Try a daily walk or other moderate aerobic exercise.  Manage stress. Find healthy ways to cope with the stressors, such as delegating tasks on your to-do list.  Practice relaxation techniques. Try deep breathing, yoga, massage and visualization.  Eat regularly. Eating regularly scheduled meals and maintaining a healthy diet might help prevent headaches. Also, drink plenty of fluids.  Follow a regular sleep schedule. Sleep deprivation might contribute to headaches Consider biofeedback. With this mind-body technique, you learn to control certain bodily functions - such as muscle tension, heart rate and blood pressure - to prevent headaches or reduce headache pain.    Proceed to emergency room if you experience new or worsening symptoms or symptoms do not resolve, if you have new neurologic symptoms or if headache is severe, or for any concerning symptom.   Orders Placed This Encounter  Procedures  . MR BRAIN W WO CONTRAST  . Comprehensive metabolic panel  . CBC   Cc: Philip Aspen, DO  Naomie Dean, MD  [facility] [address] [place], [place] [address]  Phone [phone]  Fax [phone]

## 2017-01-20 NOTE — Patient Instructions (Addendum)
Remember to drink plenty of fluid, eat healthy meals and do not skip any meals. Try to eat protein with a every meal and eat a healthy snack such as fruit or nuts in between meals. Try to keep a regular sleep-wake schedule and try to exercise daily, particularly in the form of walking, 20-30 minutes a day, if you can.   As far as your medications are concerned, I would like to suggest: Relpax(eletriptan): Please take one tablet at the onset of your headache. If it does not improve the symptoms please take one additional tablet. Do not take more then 2 tablets in 24hrs. Do not take use more then 2 to 3 times in a week.  As far as diagnostic testing: Labs, MRI brain w/wo contrast  I would like to see you back in 3 months, sooner if we need to. Please call us with any interim questions, concerns, problems, updates or refill requests.   Our phone number is [phone]. We also have an after hours call service for urgent matters and there is a physician on-call for urgent questions. For any emergencies you know to call 911 or go to the nearest emergency room  Eletriptan tablets What is this medicine? ELETRIPTAN (el ih TRIP tan) is used to treat migraines with or without aura. An aura is a strange feeling or visual disturbance that warns you of an attack. It is not used to prevent migraines. This medicine may be used for other purposes; ask your health care provider or pharmacist if you have questions. COMMON BRAND NAME(S): Relpax What should I tell my health care provider before I take this medicine? They need to know if you have any of these conditions: -bowel disease or colitis -diabetes -family history of heart disease -fast or irregular heart beat -heart or blood vessel disease, angina (chest pain), or previous heart attack -high blood pressure -high cholesterol -history of stroke, transient ischemic attacks (TIAs or mini-strokes), or intracranial bleeding -kidney or liver  disease -overweight -poor circulation -postmenopausal or surgical removal of uterus and ovaries -Raynaud's disease -seizure disorder -an unusual or allergic reaction to eletriptan, other medicines, foods, dyes, or preservatives -pregnant or trying to get pregnant -breast-feeding How should I use this medicine? Take this medicine by mouth with a glass of water. Follow the directions on the prescription label. This medicine is taken at the first symptoms of a migraine. It is not for everyday use. If your migraine headache returns after one dose, you can take another dose as directed. You must leave at least 2 hours between doses, and do not take more than 40 mg as a single dose. Do not take more than 80 mg total in any 24 hour period. If there is no improvement at all after the first dose, do not take a second dose without talking to your doctor or health care professional. Do not take your medicine more often than directed. Talk to your pediatrician regarding the use of this medicine in children. Special care may be needed. Overdosage: If you think you have taken too much of this medicine contact a poison control center or emergency room at once. NOTE: This medicine is only for you. Do not share this medicine with others. What if I miss a dose? This does not apply; this medicine is not for regular use. What may interact with this medicine? Do not take this medicine with any of the following medications: -antiviral medicines for HIV or AIDS -certain antibiotics like clarithromycin, erythromycin, telithromycin -cimetidine -  conivaptan -dalfopristin; quinupristin -diltiazem -ergot alkaloids like dihydroergotamine, ergonovine, ergotamine, methylergonovine -fluvoxamine -idelalisib -imatinib -medicines for fungal infections like fluconazole, itraconazole, ketoconazole, and voriconazole -mifepristone -nefazodone -stimulant medicines for attention disorders, weight loss, or to stay  awake -verapamil -zafirlukast This medicine may also interact with the following medications: -certain medicines for depression, anxiety, or psychotic disturbances This list may not describe all possible interactions. Give your health care provider a list of all the medicines, herbs, non-prescription drugs, or dietary supplements you use. Also tell them if you smoke, drink alcohol, or use illegal drugs. Some items may interact with your medicine. What should I watch for while using this medicine? Only take this medicine for a migraine headache. Take it if you get warning symptoms or at the start of a migraine attack. It is not for regular use to prevent migraine attacks. You may get drowsy or dizzy. Do not drive, use machinery, or do anything that needs mental alertness until you know how this medicine affects you. To reduce dizzy or fainting spells, do not sit or stand up quickly, especially if you are an older patient. Alcohol can increase drowsiness, dizziness and flushing. Avoid alcoholic drinks. Smoking cigarettes may increase the risk of heart-related side effects from using this medicine. If you take migraine medicines for 10 or more days a month, your migraines may get worse. Keep a diary of headache days and medicine use. Contact your healthcare professional if your migraine attacks occur more frequently. What side effects may I notice from receiving this medicine? Side effects that you should report to your doctor or health care professional as soon as possible: -allergic reactions like skin rash, itching or hives, swelling of the face, lips, or tongue -fast, slow, or irregular heart beat -increased or decreased blood pressure -seizures -severe stomach pain and cramping, bloody diarrhea -signs and symptoms of a blood clot such as breathing problems; changes in vision; chest pain; severe, sudden headache; pain, swelling, warmth in the leg; trouble speaking; sudden numbness or weakness of  the face, arm or leg -tingling, pain, or numbness in the face, hands, or feet Side effects that usually do not require medical attention (report to your doctor or health care professional if they continue or are bothersome): -drowsiness -feeling warm, flushing, or redness of the face -headache -muscle cramps, pain -nausea, vomiting -unusually weak or tired This list may not describe all possible side effects. Call your doctor for medical advice about side effects. You may report side effects to FDA at 1-800-FDA-1088. Where should I keep my medicine? Keep out of the reach of children. Store at room temperature between 15 and 30 degrees C (59 and 86 degrees F). Throw away any unused medicine after the expiration date. NOTE: This sheet is a summary. It may not cover all possible information. If you have questions about this medicine, talk to your doctor, pharmacist, or health care provider.  2018 Elsevier/Gold Standard (2015-10-12 11:20:42)

## 2017-01-21 ENCOUNTER — Telehealth: Payer: Self-pay

## 2017-01-21 LAB — COMPREHENSIVE METABOLIC PANEL
ALT: 19 IU/L (ref 0–32)
AST: 14 IU/L (ref 0–40)
Albumin/Globulin Ratio: 1.3 (ref 1.2–2.2)
Albumin: 4.1 g/dL (ref 3.5–5.5)
Alkaline Phosphatase: 84 IU/L (ref 39–117)
BUN/Creatinine Ratio: 16 (ref 9–23)
BUN: 12 mg/dL (ref 6–20)
Bilirubin Total: 0.2 mg/dL (ref 0.0–1.2)
CO2: 23 mmol/L (ref 18–29)
Calcium: 9.7 mg/dL (ref 8.7–10.2)
Chloride: 104 mmol/L (ref 96–106)
Creatinine, Ser: 0.75 mg/dL (ref 0.57–1.00)
GFR calc non Af Amer: 106 mL/min/{1.73_m2} (ref >59)
GFR, EST AFRICAN AMERICAN: 122 mL/min/{1.73_m2} (ref >59)
GLUCOSE: 86 mg/dL (ref 65–99)
Globulin, Total: 3.1 g/dL (ref 1.5–4.5)
POTASSIUM: 5 mmol/L (ref 3.5–5.2)
Sodium: 142 mmol/L (ref 134–144)
Total Protein: 7.2 g/dL (ref 6.0–8.5)

## 2017-01-21 LAB — CBC
Hematocrit: 46.4 % (ref 34.0–46.6)
Hemoglobin: 14.9 g/dL (ref 11.1–15.9)
MCH: 28.5 pg (ref 26.6–33.0)
MCHC: 32.1 g/dL (ref 31.5–35.7)
MCV: 89 fL (ref 79–97)
Platelets: 266 10*3/uL (ref 150–379)
RBC: 5.23 x10E6/uL (ref 3.77–5.28)
RDW: 12.9 % (ref 12.3–15.4)
WBC: 7.4 10*3/uL (ref 3.4–10.8)

## 2017-01-21 NOTE — Telephone Encounter (Signed)
Called pt w/ normal lab results. Verbalized understanding and appreciation for call. 

## 2017-01-21 NOTE — Telephone Encounter (Signed)
-----   Message from Anson FretAntonia B Ahern, MD sent at 01/21/2017 12:22 PM EDT ----- Labs normal

## 2017-01-29 ENCOUNTER — Ambulatory Visit (INDEPENDENT_AMBULATORY_CARE_PROVIDER_SITE_OTHER): Payer: BC Managed Care – PPO

## 2017-01-29 DIAGNOSIS — H5710 Ocular pain, unspecified eye: Secondary | ICD-10-CM | POA: Diagnosis not present

## 2017-01-29 DIAGNOSIS — R51 Headache: Secondary | ICD-10-CM | POA: Diagnosis not present

## 2017-01-29 DIAGNOSIS — H4710 Unspecified papilledema: Secondary | ICD-10-CM | POA: Diagnosis not present

## 2017-01-29 DIAGNOSIS — R519 Headache, unspecified: Secondary | ICD-10-CM

## 2017-01-29 DIAGNOSIS — H539 Unspecified visual disturbance: Secondary | ICD-10-CM

## 2017-01-29 MED ORDER — GADOPENTETATE DIMEGLUMINE 469.01 MG/ML IV SOLN: 20.0000 mL | Freq: Once | INTRAVENOUS | Status: DC | PRN

## 2017-01-31 ENCOUNTER — Telehealth: Payer: Self-pay | Admitting: Neurology

## 2017-01-31 NOTE — Telephone Encounter (Signed)
Discussed with J Hogan. Patient is ok to wait until next week to discuss with Dr. Ahern. -VRP 

## 2017-01-31 NOTE — Telephone Encounter (Signed)
Patient called office in reference to eletriptan (RELPAX) 40 MG tablet.  Patient states she took medication and could barely keep her eyes open.  Patient felt a rush between her head and chest, felt weird.  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  Please call

## 2017-01-31 NOTE — Telephone Encounter (Signed)
New pt of Dr. Lucia Gaskins seen for migraines 01/20/17. Tried and failed Imitrex in the past so was started on eletriptan (Relpax). Discussed preventative such as topiramate but not yet prescribed. Underwent MRI brain w wo on Wed, results not yet available.

## 2017-02-03 ENCOUNTER — Telehealth: Payer: Self-pay

## 2017-02-03 NOTE — Telephone Encounter (Signed)
Leah Schneider, I would try a different triptan for her. We can try Imitrex again or maybe zomig. Or we can start Topiramate. Please discuss with her. I she has a lot of questions about medications she will have to come in for an appt and she can see megan.  thanks.

## 2017-02-03 NOTE — Telephone Encounter (Signed)
Called pt w/ unremarkable MRI results. Verbalized understanding and appreciation for call. (See additional phone note re: med management).

## 2017-02-03 NOTE — Telephone Encounter (Signed)
Patient called office in reference to Relpax.  Please call

## 2017-02-03 NOTE — Telephone Encounter (Signed)
Returned pt TC. Says that she's had a migraine for about 6 days. She took Relpax on Friday but HAs returned shortly afterward. Also reports side effects of chest tightness and drowsiness. Has tried Imitrex in the past but does not remember if med was beneficial or how well she tolerated it. Would like to discuss additional med management for HAs.

## 2017-02-03 NOTE — Telephone Encounter (Signed)
-----   Message from Anson Fret, MD sent at 02/03/2017  9:23 AM EDT ----- MRI brain unremarkable for age. thanks

## 2017-02-04 MED ORDER — TOPIRAMATE 25 MG PO TABS: ORAL_TABLET | ORAL | 0 refills | Status: DC

## 2017-02-04 MED ORDER — SUMATRIPTAN SUCCINATE 100 MG PO TABS: 100.0000 mg | ORAL_TABLET | Freq: Once | ORAL | 11 refills | Status: DC | PRN

## 2017-02-04 NOTE — Telephone Encounter (Signed)
Called and spoke to pt who reports ongoing migraine. Says that she is currently in bed w/ HA and is willing to try meds recommended by MD. Josefine Class for topiramate and sumatriptan e-scribed to pt's verified pharmacy. Reviewed instructions w/ pt. Agreed to call back w/ any side effects or if symptoms get worse or do not improve w/ meds. Verbalized understanding and appreciation for call.

## 2017-02-04 NOTE — Addendum Note (Signed)
Addended by: Donnelly Angelica on: 02/04/2017 06:17 PM   Modules accepted: Orders

## 2017-03-26 ENCOUNTER — Other Ambulatory Visit: Payer: Self-pay | Admitting: Neurology

## 2017-03-26 MED ORDER — TOPIRAMATE 25 MG PO TABS: 50.0000 mg | ORAL_TABLET | Freq: Every day | ORAL | 5 refills | Status: DC

## 2017-03-26 NOTE — Telephone Encounter (Signed)
Refills e-scribed to pt's pharmacy. 

## 2017-03-26 NOTE — Addendum Note (Signed)
Addended by: Donnelly AngelicaHOGAN, JENNIFER L on: 03/26/2017 02:13 PM   Modules accepted: Orders

## 2017-03-26 NOTE — Addendum Note (Signed)
Addended by: Donnelly AngelicaHOGAN, JENNIFER L on: 03/26/2017 01:22 PM   Modules accepted: Orders

## 2017-03-26 NOTE — Telephone Encounter (Signed)
Spoke to pt over phone, topiramate is getting low.

## 2017-04-29 ENCOUNTER — Ambulatory Visit: Payer: BC Managed Care – PPO | Admitting: Neurology

## 2017-05-13 ENCOUNTER — Encounter: Payer: Self-pay | Admitting: Neurology

## 2017-05-13 ENCOUNTER — Ambulatory Visit (INDEPENDENT_AMBULATORY_CARE_PROVIDER_SITE_OTHER): Payer: BC Managed Care – PPO | Admitting: Neurology

## 2017-05-13 VITALS — BP 119/81 | HR 93 | Ht 65.0 in | Wt 225.6 lb

## 2017-05-13 DIAGNOSIS — G43009 Migraine without aura, not intractable, without status migrainosus: Secondary | ICD-10-CM

## 2017-05-13 NOTE — Progress Notes (Signed)
[facility]    Provider:  Dr Lucia Gaskins Referring Provider: Philip Aspen DO Primary Care Physician:  Philip Aspen DO  CC:  Migraines  Interval history 05/13/2017: Patient is here for follow-up of migraines. MRI of the brain was unremarkable, CBC and CMP normal April 2018. Patient has tried Imitrex and Relpax. Patient was started on topiramate and re-tried on Imitrex. She stopped the Topiramate. Also stopped her birth control pill and just had a mirena placed. She did not like the Topiramate due to side effects. Did not tolerate Relpax. Imitrex is working. Takes 30-60 minutes.  She has 4 migraine days a month.   HPI:  Leah Schneider is a 31 y.o. female here as a referral from Dr. Claiborne Billings for migraines. Past medical history migraines without aura, obesity. She has at least one a month usually right before and right after her period. Weather is a trigger. They are in the forehead behind the eyes, can be severe and last days, can be unilateral, pounding/throbbing, light makes it worse and she can;t even focus, she has nausea "here and there", no vomiting. No aura. They start slowly and ramp up, sometimes she wake with it or later in the day no patterns. 3-4 days before her period she will get it and at the end. She recently switched her birth control. She tried imitrex and it didn't work. Tylenol and alleve do not help. Going into a dark room and laying still helps. No other associated symptoms. She is not sleeping well only 6-7 hours. She is in [employer]. She drinks plenty of water. She has 3 kids. Migraines worsening and becoming more frequent with new vision changes.No other focal neurologic deficits, associated symptoms, inciting events or modifiable factors.  Reviewed notes, labs and imaging from outside physicians, which showed:   Reviewed notes from primary care in 2016 when she was seen for migraine without aura at [facility]. She was seen for migraines and had them on  and off for years, usually around her periods but getting worse. Migraines can be severe and can last almost an entire day. Usually pain is on the right side or at least just one-sided usually unilateral behind the eyes often times is up the neck as well with pain in the neck and behind the eyes usually she'll get it to a dark room and has to take a nap to help. She'll take ibuprofen and lately been taking Aleve. Often times gets nauseated and feels like she wants to vomit but typically does not vomit. No aura or visual changes. In the setting of stress. Associated loose bowels. 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 She was started on abortive medication Imitrex 50 mg. She was asked to keep a headache diary. She was also given Zofran for nausea. Obesity and weight loss were discussed. Exam was normal including general, neck, cardiovascular, pulmonary, abdominal, neurologic and skin.  Review of Systems: Patient complains of symptoms per HPI as well as the following symptoms: no CP, no SOB. Pertinent negatives per HPI. All others negative.    Social History   Social History  . Marital status: Married    Spouse name: N/A  . Number of children: 3  . Years of education: college   Occupational History  . Not on file.   Social History Main Topics  . Smoking status: Never Smoker  . Smokeless tobacco: Never Used  . Alcohol use Yes     Comment:  Rare  . Drug use: No  . Sexual activity: Yes    Birth control/ protection: None   Other Topics Concern  . Not on file   Social History Narrative   Drinks caffeine occasionally     Family History  Problem Relation Age of Onset  . Heart disease Maternal Grandmother   . Anesthesia problems Neg Hx     Past Medical History:  Diagnosis Date  . Headache   . Herpes   . No pertinent past medical history     Past Surgical History:  Procedure Laterality Date  . CESAREAN  SECTION    . CESAREAN SECTION  12/19/2011   Procedure: CESAREAN SECTION;  Surgeon: Tilda BurrowJohn V Ferguson, MD;  Location: [facility];  Service: Gynecology;  Laterality: N/A;    Current Outpatient Prescriptions  Medication Sig Dispense Refill  . SUMAtriptan (IMITREX) 100 MG tablet Take 1 tablet (100 mg total) by mouth once as needed for migraine. May repeat in 2 hours if headache persists or recurs. 10 tablet 11  . topiramate (TOPAMAX) 25 MG tablet Take 2 tablets (50 mg total) by mouth at bedtime. 60 tablet 5  . TRI-LEGEST FE 1-20/1-30/1-35 MG-MCG tablet Take 1 tablet by mouth daily.  1   No current facility-administered medications for this visit.    Facility-Administered Medications Ordered in Other Visits  Medication Dose Route Frequency Provider Last Rate Last Dose  . gadopentetate dimeglumine (MAGNEVIST) injection 20 mL  20 mL Intravenous Once PRN Anson FretAhern, Antonia B, MD        Allergies as of 05/13/2017  . (No Known Allergies)    Vitals: There were no vitals taken for this visit. Last Weight:  Wt Readings from Last 1 Encounters:  01/20/17 225 lb (102.1 kg)   Last Height:   Ht Readings from Last 1 Encounters:  01/20/17 5\' 5"  (1.651 m)    Physical exam: Exam: Gen: NAD, conversant, well nourised, obese, well groomed                     CV: RRR, no MRG. No Carotid Bruits. No peripheral edema, warm, nontender Eyes: Conjunctivae clear without exudates or hemorrhage  Neuro: Detailed Neurologic Exam  Speech:    Speech is normal; fluent and spontaneous with normal comprehension.  Cognition:    The patient is oriented to person, place, and time;     recent and remote memory intact;     language fluent;     normal attention, concentration,     fund of knowledge Cranial Nerves:    The pupils are equal, round, and reactive to light. The fundi are normal and spontaneous venous pulsations are present. Visual fields are full to finger confrontation. Extraocular movements are intact.  Trigeminal sensation is intact and the muscles of mastication are normal. The face is symmetric. The palate elevates in the midline. Hearing intact. Voice is normal. Shoulder shrug is normal. The tongue has normal motion without fasciculations.   Coordination:    Normal finger to nose and heel to shin. Normal rapid alternating movements.   Gait:    Heel-toe and tandem gait are normal.   Motor Observation:    No asymmetry, no atrophy, and no involuntary movements noted. Tone:    Normal muscle tone.    Posture:    Posture is normal. normal erect    Strength:    Strength is V/V in the upper and lower limbs.      Sensation: intact to LT  Reflex Exam:  DTR's:    Deep tendon reflexes in the upper and lower extremities are normal bilaterally.   Toes:    The toes are downgoing bilaterally.   Clonus:    Clonus is absent.    Assessment/Plan:  31 year old with migraines.  - Imitrex (pill or nasal powder) at onset. Do not take if pregnant. Gave Onzetra sample. Take with Cambia or zipsor, gave samples - Discussed preventative such as Aimovig, will hold off at this time - encourage weight loss and good sleep habits - MRI brain due to worsening quality and frequency with new vision changes and blurring of the optic disks: to eval for intracranial etiologies, masses and lesions, intracranial HTN: Was normal -Labs: were normal  Discussed: To prevent or relieve headaches, try the following: Cool Compress. Lie down and place a cool compress on your head.  Avoid headache triggers. If certain foods or odors seem to have triggered your migraines in the past, avoid them. A headache diary might help you identify triggers.  Include physical activity in your daily routine. Try a daily walk or other moderate aerobic exercise.  Manage stress. Find healthy ways to cope with the stressors, such as delegating tasks on your to-do list.  Practice relaxation techniques. Try deep breathing, yoga,  massage and visualization.  Eat regularly. Eating regularly scheduled meals and maintaining a healthy diet might help prevent headaches. Also, drink plenty of fluids.  Follow a regular sleep schedule. Sleep deprivation might contribute to headaches Consider biofeedback. With this mind-body technique, you learn to control certain bodily functions - such as muscle tension, heart rate and blood pressure - to prevent headaches or reduce headache pain.    Proceed to emergency room if you experience new or worsening symptoms or symptoms do not resolve, if you have new neurologic symptoms or if headache is severe, or for any concerning symptom.   Provided education and documentation from American headache Society toolbox including articles on: chronic migraine medication overuse headache, chronic migraines, prevention of migraines, behavioral and other nonpharmacologic treatments for headache.   Naomie Dean, MD  [facility] [address] [place], [place] [address]  Phone [phone] Fax [phone]  A total of 25 minutes was spent face-to-face with this patient. Over half this time was spent on counseling patient on the migraine diagnosis and different diagnostic and therapeutic options available.

## 2017-07-18 IMAGING — US US THYROID BIOPSY
1 series · 13 of 14 positions shown · non-contrast
Comparison: Left thyroid nodule biopsy 11/26/2012, thyroid
ultrasound 02/09/2016

MEDICATIONS:
None

COMPLICATIONS:
None immediate.

INDICATION: Indeterminate left thyroid nodule

EXAM:
ULTRASOUND GUIDED FINE NEEDLE ASPIRATION OF INDETERMINATE LEFT
THYROID NODULE
TECHNIQUE: Informed written consent was obtained from the patient after a
discussion of the risks, benefits and alternatives to treatment.
Questions regarding the procedure were encouraged and answered. A
timeout was performed prior to the initiation of the procedure.

[Series 1: us thyroid biopsy · 0.07mm/px · 14 acquisitions, 13 frames shown]
[im 1/14]
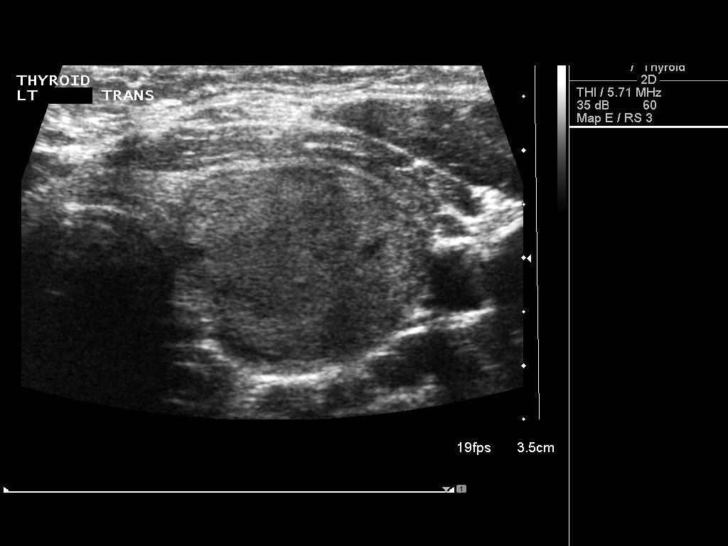
[im 2/14]
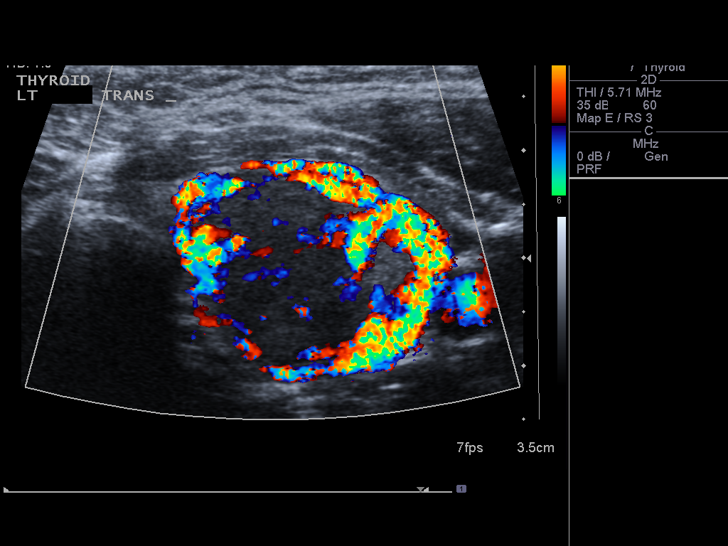
[im 3/14]
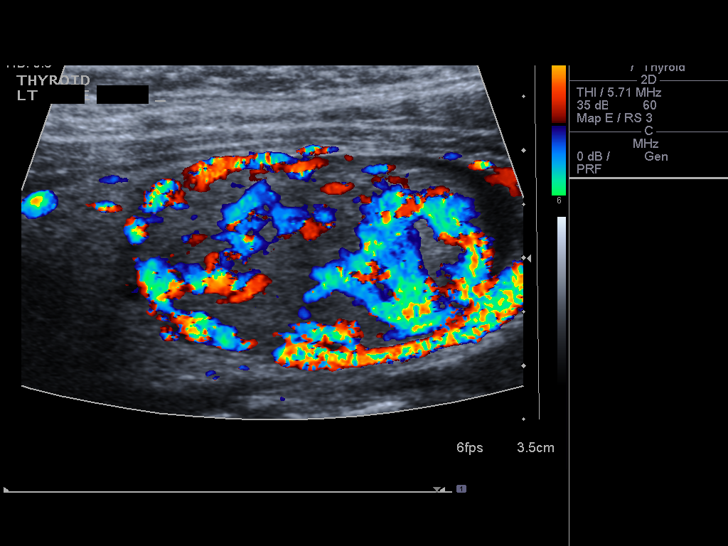
[im 4/14]
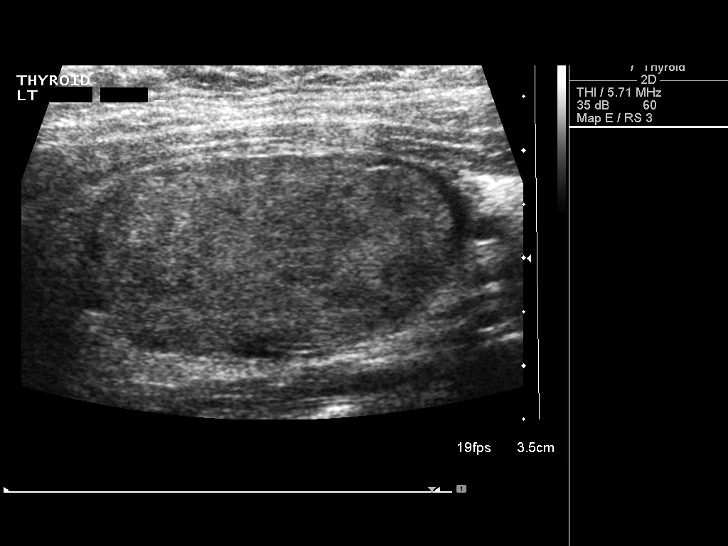
[im 5/14]
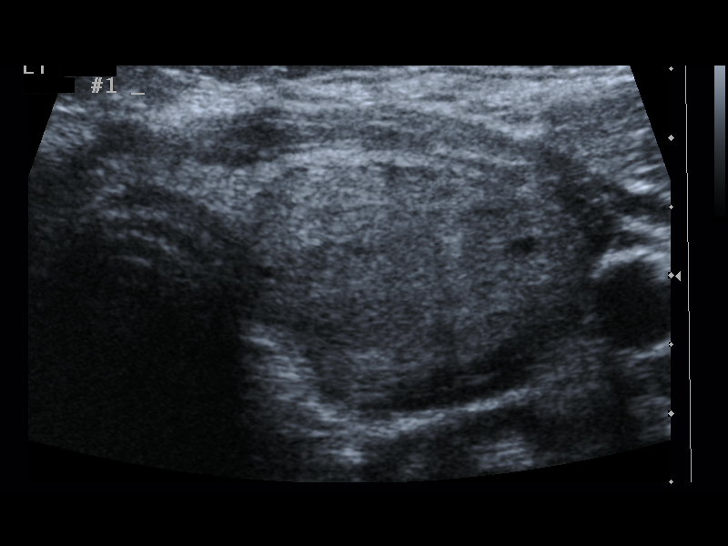
[im 6/14]
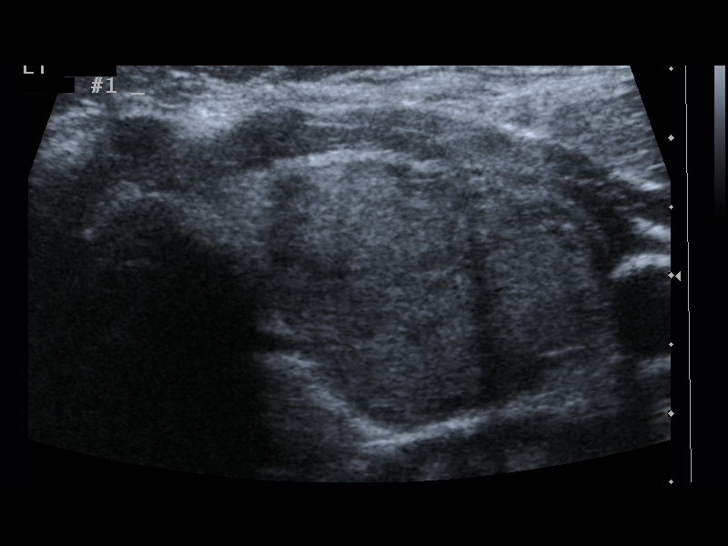
[im 8/14]
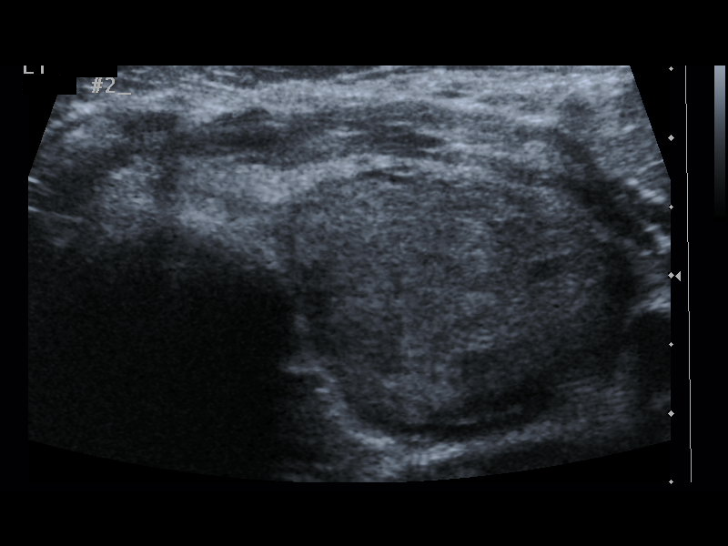
[im 9/14]
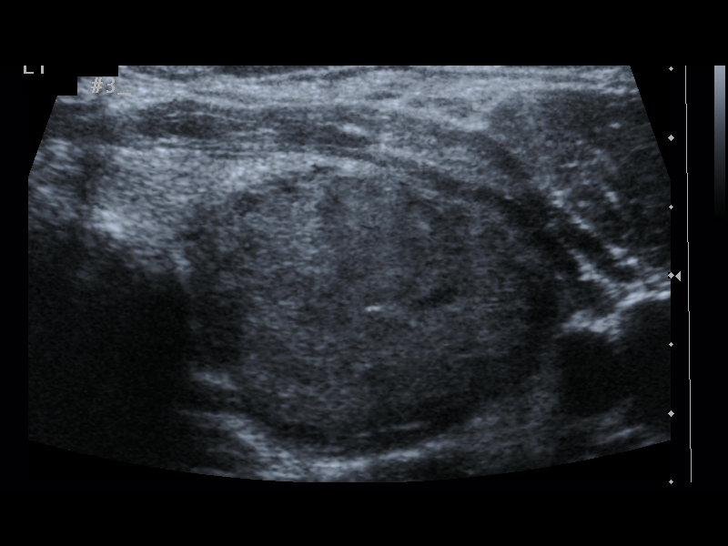
[im 10/14]
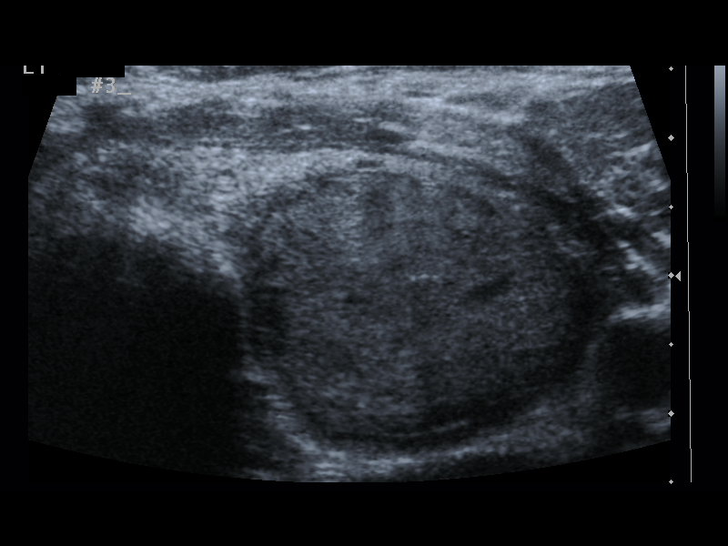
[im 11/14]
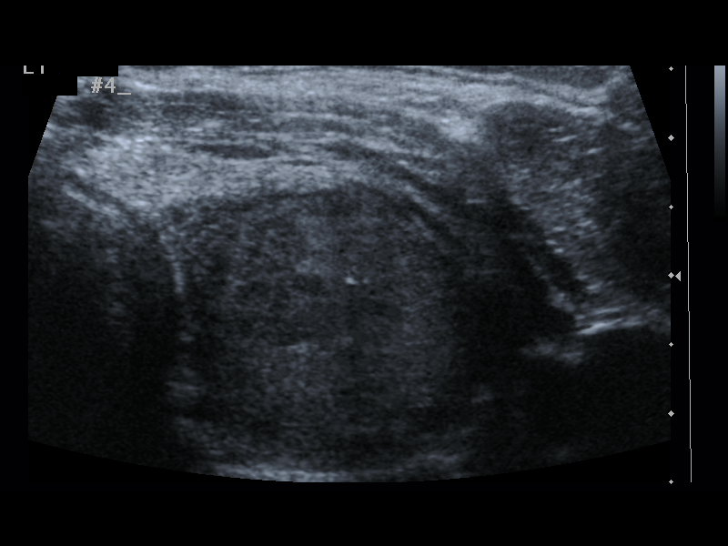
[im 12/14]
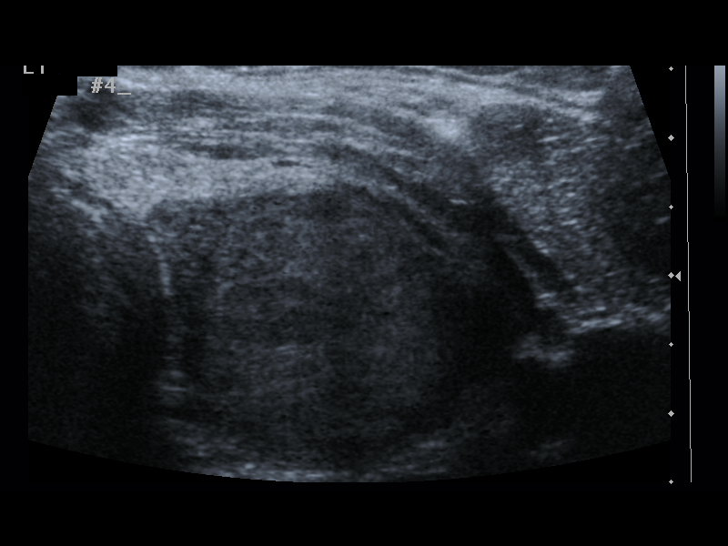
[im 13/14]
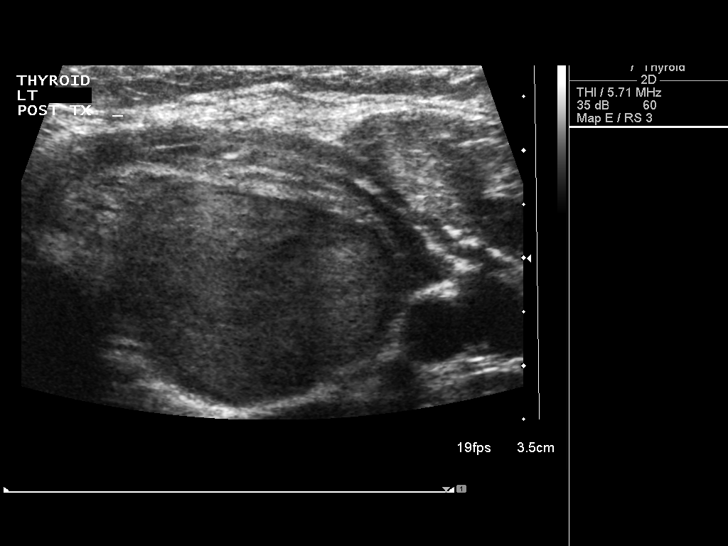
[im 14/14]
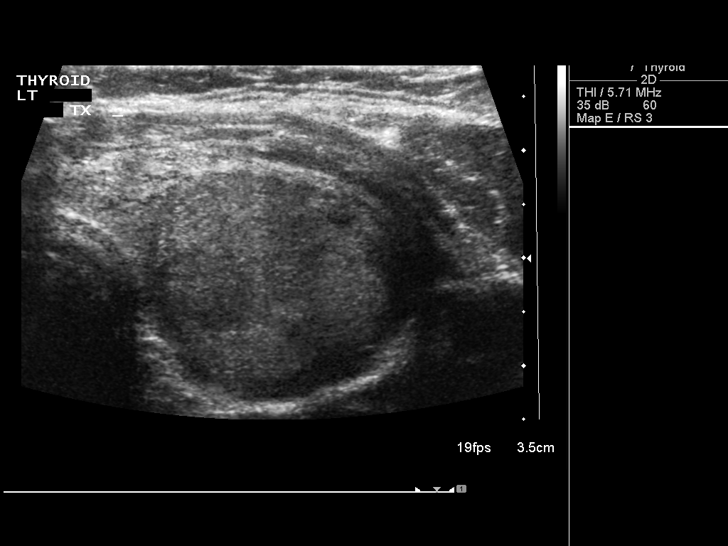

[13 of 14 positions shown; findings below may reference images not displayed]

Pre-procedural ultrasound scanning demonstrated dominant left
thyroid nodule, now measuring 2.5 x 3.1 x 1.7 cm, predominantly
solid in appearance.

The procedure was planned. The neck was prepped in the usual sterile
fashion, and a sterile drape was applied covering the operative
field. A timeout was performed prior to the initiation of the
procedure. Local anesthesia was provided with 1% lidocaine.

Under direct ultrasound guidance, 4 FNA biopsies were performed of
the left thyroid nodule with a 25 gauge needle. The samples were
prepared and submitted to pathology.

Limited post procedural scanning was negative for hematoma or
additional complication. Dressings were placed. The patient
tolerated the above procedures procedure well without immediate
postprocedural complication.
IMPRESSION: Technically successful ultrasound guided fine needle aspiration of
left thyroid nodule as described above.

## 2018-02-19 ENCOUNTER — Other Ambulatory Visit: Payer: Self-pay | Admitting: Family Medicine

## 2018-02-19 DIAGNOSIS — R109 Unspecified abdominal pain: Secondary | ICD-10-CM

## 2018-02-20 ENCOUNTER — Ambulatory Visit
Admission: RE | Admit: 2018-02-20 | Discharge: 2018-02-20 | Disposition: A | Discharge status: Home / Self Care | Payer: BC Managed Care – PPO | Source: Ambulatory Visit | Attending: Family Medicine | Admitting: Family Medicine

## 2018-02-20 DIAGNOSIS — R109 Unspecified abdominal pain: Secondary | ICD-10-CM

## 2018-02-26 ENCOUNTER — Other Ambulatory Visit (HOSPITAL_COMMUNITY): Payer: Self-pay | Admitting: Family Medicine

## 2018-02-26 DIAGNOSIS — R109 Unspecified abdominal pain: Secondary | ICD-10-CM

## 2018-03-09 ENCOUNTER — Encounter (HOSPITAL_COMMUNITY)
Admission: RE | Admit: 2018-03-09 | Discharge: 2018-03-09 | Disposition: A | Discharge status: Home / Self Care | Payer: BC Managed Care – PPO | Source: Ambulatory Visit | Attending: Family Medicine | Admitting: Family Medicine

## 2018-03-09 DIAGNOSIS — R109 Unspecified abdominal pain: Secondary | ICD-10-CM

## 2018-03-09 MED ORDER — TECHNETIUM TC 99M MEBROFENIN IV KIT
5.0000 | PACK | Freq: Once | INTRAVENOUS | Status: AC | PRN
  Administered 2018-03-09: 5 via INTRAVENOUS

## 2018-04-22 ENCOUNTER — Other Ambulatory Visit: Payer: Self-pay | Admitting: Gastroenterology

## 2018-04-22 DIAGNOSIS — R1031 Right lower quadrant pain: Secondary | ICD-10-CM

## 2018-05-08 ENCOUNTER — Ambulatory Visit
Admission: RE | Admit: 2018-05-08 | Discharge: 2018-05-08 | Disposition: A | Discharge status: Home / Self Care | Payer: BC Managed Care – PPO | Source: Ambulatory Visit | Attending: Gastroenterology | Admitting: Gastroenterology

## 2018-05-08 DIAGNOSIS — R1031 Right lower quadrant pain: Secondary | ICD-10-CM

## 2018-05-08 MED ORDER — IOPAMIDOL (ISOVUE-300) INJECTION 61%
125.0000 mL | Freq: Once | INTRAVENOUS | Status: AC | PRN
  Administered 2018-05-08: 125 mL via INTRAVENOUS

## 2018-11-30 ENCOUNTER — Other Ambulatory Visit: Payer: Self-pay | Admitting: Neurology

## 2018-12-01 NOTE — Telephone Encounter (Signed)
Pt last seen 04/2017. Pending appt for March 2020. Will send to Dr. Lucia Gaskins for her approval.

## 2019-01-18 ENCOUNTER — Institutional Professional Consult (permissible substitution): Payer: BC Managed Care – PPO | Admitting: Neurology

## 2019-08-17 ENCOUNTER — Other Ambulatory Visit: Payer: Self-pay

## 2019-08-17 DIAGNOSIS — Z20822 Contact with and (suspected) exposure to covid-19: Secondary | ICD-10-CM

## 2019-08-18 LAB — NOVEL CORONAVIRUS, NAA: SARS-CoV-2, NAA: NOT DETECTED

## 2019-10-08 ENCOUNTER — Other Ambulatory Visit: Payer: Self-pay

## 2019-10-08 DIAGNOSIS — Z20822 Contact with and (suspected) exposure to covid-19: Secondary | ICD-10-CM

## 2019-10-10 LAB — NOVEL CORONAVIRUS, NAA: SARS-CoV-2, NAA: DETECTED — AB

## 2019-11-03 ENCOUNTER — Other Ambulatory Visit: Payer: Self-pay | Admitting: Neurology

## 2019-11-05 ENCOUNTER — Other Ambulatory Visit: Payer: Self-pay | Admitting: Neurology

## 2019-11-09 ENCOUNTER — Other Ambulatory Visit: Payer: Self-pay | Admitting: Endocrinology

## 2019-11-09 DIAGNOSIS — E041 Nontoxic single thyroid nodule: Secondary | ICD-10-CM

## 2019-11-16 ENCOUNTER — Other Ambulatory Visit: Payer: Self-pay

## 2019-11-16 ENCOUNTER — Ambulatory Visit
Admission: RE | Admit: 2019-11-16 | Discharge: 2019-11-16 | Disposition: A | Discharge status: Home / Self Care | Payer: BC Managed Care – PPO | Source: Ambulatory Visit | Attending: Endocrinology | Admitting: Endocrinology

## 2019-11-16 DIAGNOSIS — E041 Nontoxic single thyroid nodule: Secondary | ICD-10-CM

## 2019-11-17 ENCOUNTER — Other Ambulatory Visit: Payer: Self-pay | Admitting: Neurology

## 2019-12-27 ENCOUNTER — Other Ambulatory Visit: Payer: Self-pay | Admitting: Otolaryngology

## 2019-12-27 DIAGNOSIS — E041 Nontoxic single thyroid nodule: Secondary | ICD-10-CM

## 2020-01-05 ENCOUNTER — Other Ambulatory Visit (HOSPITAL_COMMUNITY)
Admission: RE | Admit: 2020-01-05 | Discharge: 2020-01-05 | Disposition: A | Discharge status: Home / Self Care | Payer: BC Managed Care – PPO | Source: Ambulatory Visit | Attending: Otolaryngology | Admitting: Otolaryngology

## 2020-01-05 ENCOUNTER — Ambulatory Visit
Admission: RE | Admit: 2020-01-05 | Discharge: 2020-01-05 | Disposition: A | Discharge status: Home / Self Care | Payer: BC Managed Care – PPO | Source: Ambulatory Visit | Attending: Otolaryngology | Admitting: Otolaryngology

## 2020-01-05 DIAGNOSIS — E041 Nontoxic single thyroid nodule: Secondary | ICD-10-CM | POA: Diagnosis not present

## 2020-01-06 LAB — CYTOLOGY - NON PAP

## 2020-01-14 NOTE — H&P (Signed)
HPI:   Leah Schneider is a 34 y.o. female who presents as a consult Patient.   Referring Provider: Neldon Labella, MD  Chief complaint: Thyroid nodule.  HPI: Left-sided thyroid nodule for several years, increasing in size. Most recent ultrasound reveals 4 cm nodule. She starting to have compressive symptoms. Otherwise in good health. No history of thyroid function abnormality. Followed by an endocrinologist in town. Most recent biopsy 4 years ago was benign.  PMH/Meds/All/SocHx/FamHx/ROS:   Past Medical History:  Diagnosis Date  . Diverticulitis  . IBS (irritable bowel syndrome)   Past Surgical History:  Procedure Laterality Date  . REFRACTIVE SURGERY   No family history of bleeding disorders, wound healing problems or difficulty with anesthesia.   Social History   Socioeconomic History  . Marital status: Married  Spouse name: Not on file  . Number of children: Not on file  . Years of education: Not on file  . Highest education level: Not on file  Occupational History  . Not on file  Social Needs  . Financial resource strain: Not on file  . Food insecurity  Worry: Not on file  Inability: Not on file  . Transportation needs  Medical: Not on file  Non-medical: Not on file  Tobacco Use  . Smoking status: Never Smoker  . Smokeless tobacco: Never Used  Substance and Sexual Activity  . Alcohol use: No  . Drug use: No  . Sexual activity: Not on file  Lifestyle  . Physical activity  Days per week: Not on file  Minutes per session: Not on file  . Stress: Not on file  Relationships  . Social Multimedia programmer on phone: Not on file  Gets together: Not on file  Attends religious service: Not on file  Active member of club or organization: Not on file  Attends meetings of clubs or organizations: Not on file  Relationship status: Not on file  Other Topics Concern  . Not on file  Social History Narrative  . Not on file   Current Outpatient Medications:  .  levonorgestrel (MIRENA) 20 mcg/24 hours (5 yrs) 52 mg IUD, Mirena 20 mcg/24 hours (5 yrs) 52 mg intrauterine device Take 1 device by intrauterine route., Disp: , Rfl:  . SUMAtriptan (IMITREX) 100 MG tablet, Take as directed as needed, Disp: , Rfl:   A complete ROS was performed with pertinent positives/negatives noted in the HPI. The remainder of the ROS are negative.   Physical Exam:   BP 122/81  Pulse 68  Temp 97.1 F (36.2 C)  Ht 1.651 m (5\' 5" )  Wt 106.6 kg (235 lb)  BMI 39.11 kg/m   General: Healthy and alert, in no distress, breathing easily. Normal affect. In a pleasant mood. Head: Normocephalic, atraumatic. No masses, or scars. Eyes: Pupils are equal, and reactive to light. Vision is grossly intact. No spontaneous or gaze nystagmus. Ears: Ear canals are clear. Tympanic membranes are intact, with normal landmarks and the middle ears are clear and healthy. Hearing: Grossly normal. Nose: Nasal cavities are clear with healthy mucosa, no polyps or exudate. Airways are patent. Face: No masses or scars, facial nerve function is symmetric. Oral Cavity: No mucosal abnormalities are noted. Tongue with normal mobility. Dentition appears healthy. Oropharynx: Tonsils are symmetric. There are no mucosal masses identified. Tongue base appears normal and healthy. Larynx/Hypopharynx: deferred Chest: Deferred Neck: No palpable masses, no cervical adenopathy, diffuse enlargement of the left thyroid lobe, somewhat firm. Neuro: Cranial nerves II-XII with normal function. Balance:  Normal gate. Other findings: none.  Independent Review of Additional Tests or Records:  none  Procedures:  none  Impression & Plans:  Solitary thyroid nodule, 4 cm, left side, increasing in size now with compressive symptoms. Different options were discussed including observation, surgical removal, repeat FNA. She would like to do a repeat biopsy. We will set this up under ultrasound guidance. This will help to  determine #1 if this needs to be removed and #2 if she elects to have it removed because of the compressive symptoms and the fact that it has been getting larger, can help to determine if we need to plan a total thyroidectomy. She understands all of this. All questions were answered. Risks and benefits of surgery were discussed as well including recurrent nerve injury and hypocalcemia.

## 2020-01-21 ENCOUNTER — Encounter (HOSPITAL_COMMUNITY): Payer: Self-pay

## 2020-01-27 NOTE — Progress Notes (Addendum)
  [facility] [address] [place] - [address] [place] [address] Phone: [phone] Fax: [phone]  [facility] [place] [facility] [place], [place] - [address] [place] [address] Phone: [phone] Fax: [phone]  ---SDW---  PCP - Dr. Sigmund Hazel Cardiologist - denies  PPM/ICD - denies  Chest x-ray - DOS EKG - N/A Stress Test - denies ECHO - denies Cardiac Cath - denies  Sleep Study - denies CPAP - N/A  DM: denies  Blood Thinner Instructions: N/A Aspirin Instructions: N/A  ERAS Protcol - No orders  COVID TEST- Scheduled for today at 0925. Patient verbalized understanding of self-quarantine instructions, appointment time and place.  Anesthesia review: NO  Patient denies shortness of breath, fever, cough and chest pain on pre-op phone call.  All instructions explained to the patient, with a verbal understanding of the material.  The opportunity to ask questions was provided.   Your procedure is scheduled on Monday, April 5th.  Report to [facility] Main Entrance "A" at 5:30 A.M., and check in at the Admitting office.  Call this number if you have problems the morning of surgery:  [phone]  Call [phone] if you have any questions prior to your surgery date Monday-Friday 8am-4pm   Remember:  Do not eat or drink after midnight the night before your surgery    Take these medicines the morning of surgery with A SIP OF WATER   If needed - SUMAtriptan (IMITREX)  As of today, STOP taking any Aspirin (unless otherwise instructed by your surgeon) and Aspirin containing products, Aleve, Naproxen, Ibuprofen, Motrin, Advil, Goody's, BC's, all herbal medications, fish oil, and all vitamins.                     Do not wear jewelry, make up, or nail polish            Do not wear lotions, powders, perfumes, or deodorant.            Do not shave 48 hours prior to surgery.              Do not bring valuables to the  hospital.            [facility] is not responsible for any belongings or valuables.  Do NOT Smoke (Tobacco/Vapping) or drink Alcohol 24 hours prior to your procedure If you use a CPAP at night, you may bring all equipment for your overnight stay.   Contacts, glasses, dentures or bridgework may not be worn into surgery.      For patients admitted to the hospital, discharge time will be determined by your treatment team.   Patients discharged the day of surgery will not be allowed to drive home, and someone needs to stay with them for 24 hours.  Special instructions:    Day of Surgery:  Shower before coming to the hospital Do not apply any deodorants/lotions.  Please wear clean clothes to the hospital/surgery center.   Remember to brush your teeth WITH YOUR REGULAR TOOTHPASTE.

## 2020-01-28 ENCOUNTER — Encounter (HOSPITAL_COMMUNITY): Payer: Self-pay

## 2020-01-28 ENCOUNTER — Inpatient Hospital Stay (HOSPITAL_COMMUNITY)
Admission: RE | Admit: 2020-01-28 | Discharge: 2020-01-28 | Disposition: A | Discharge status: Home / Self Care | Payer: BC Managed Care – PPO | Source: Ambulatory Visit

## 2020-01-28 ENCOUNTER — Other Ambulatory Visit (HOSPITAL_COMMUNITY)
Admission: RE | Admit: 2020-01-28 | Discharge: 2020-01-28 | Disposition: A | Discharge status: Home / Self Care | Payer: BC Managed Care – PPO | Source: Ambulatory Visit | Attending: Otolaryngology | Admitting: Otolaryngology

## 2020-01-28 ENCOUNTER — Other Ambulatory Visit: Payer: Self-pay

## 2020-01-28 DIAGNOSIS — Z01812 Encounter for preprocedural laboratory examination: Secondary | ICD-10-CM | POA: Diagnosis not present

## 2020-01-28 DIAGNOSIS — Z20822 Contact with and (suspected) exposure to covid-19: Secondary | ICD-10-CM | POA: Diagnosis not present

## 2020-01-28 LAB — SARS CORONAVIRUS 2 (TAT 6-24 HRS): SARS Coronavirus 2: NEGATIVE

## 2020-01-30 NOTE — Anesthesia Preprocedure Evaluation (Addendum)
Anesthesia Evaluation  Patient identified by MRN, date of birth, ID band Patient awake    Reviewed: Allergy & Precautions, NPO status , Patient's Chart, lab work & pertinent test results  Airway Mallampati: I  TM Distance: >3 FB Neck ROM: Full    Dental no notable dental hx. (+) Dental Advisory Given, Teeth Intact   Pulmonary neg pulmonary ROS,    Pulmonary exam normal breath sounds clear to auscultation       Cardiovascular negative cardio ROS Normal cardiovascular exam Rhythm:Regular Rate:Normal     Neuro/Psych  Headaches,    GI/Hepatic negative GI ROS, Neg liver ROS,   Endo/Other  negative endocrine ROS  Renal/GU negative Renal ROS     Musculoskeletal negative musculoskeletal ROS (+)   Abdominal (+) + obese,   Peds  Hematology negative hematology ROS (+)   Anesthesia Other Findings Day of surgery medications reviewed with the patient.  Reproductive/Obstetrics                            Anesthesia Physical Anesthesia Plan  ASA: II  Anesthesia Plan: General   Post-op Pain Management:    Induction: Intravenous  PONV Risk Score and Plan: 4 or greater and Ondansetron, Dexamethasone, Midazolam, Scopolamine patch - Pre-op and Treatment may vary due to age or medical condition  Airway Management Planned: Oral ETT  Additional Equipment: None  Intra-op Plan:   Post-operative Plan: Extubation in OR  Informed Consent: I have reviewed the patients History and Physical, chart, labs and discussed the procedure including the risks, benefits and alternatives for the proposed anesthesia with the patient or authorized representative who has indicated his/her understanding and acceptance.     Dental advisory given  Plan Discussed with: CRNA  Anesthesia Plan Comments:        Anesthesia Quick Evaluation

## 2020-01-31 ENCOUNTER — Ambulatory Visit (HOSPITAL_COMMUNITY): Payer: BC Managed Care – PPO | Admitting: Vascular Surgery

## 2020-01-31 ENCOUNTER — Encounter (HOSPITAL_COMMUNITY)
Admission: RE | Disposition: A | Discharge status: Home / Self Care | Payer: Self-pay | Source: Home / Self Care | Attending: Otolaryngology

## 2020-01-31 ENCOUNTER — Other Ambulatory Visit: Payer: Self-pay

## 2020-01-31 ENCOUNTER — Ambulatory Visit (HOSPITAL_COMMUNITY): Payer: BC Managed Care – PPO | Admitting: Anesthesiology

## 2020-01-31 ENCOUNTER — Ambulatory Visit: Payer: Self-pay | Admitting: Otolaryngology

## 2020-01-31 ENCOUNTER — Ambulatory Visit (HOSPITAL_COMMUNITY): Payer: BC Managed Care – PPO

## 2020-01-31 ENCOUNTER — Observation Stay (HOSPITAL_COMMUNITY)
Admission: RE | Admit: 2020-01-31 | Discharge: 2020-02-01 | Disposition: A | Discharge status: Home / Self Care | Payer: BC Managed Care – PPO | Attending: Otolaryngology | Admitting: Otolaryngology

## 2020-01-31 ENCOUNTER — Encounter (HOSPITAL_COMMUNITY): Payer: Self-pay | Admitting: Otolaryngology

## 2020-01-31 DIAGNOSIS — E890 Postprocedural hypothyroidism: Secondary | ICD-10-CM

## 2020-01-31 DIAGNOSIS — E041 Nontoxic single thyroid nodule: Principal | ICD-10-CM | POA: Insufficient documentation

## 2020-01-31 DIAGNOSIS — Z01811 Encounter for preprocedural respiratory examination: Secondary | ICD-10-CM

## 2020-01-31 DIAGNOSIS — Z9889 Other specified postprocedural states: Secondary | ICD-10-CM

## 2020-01-31 DIAGNOSIS — E042 Nontoxic multinodular goiter: Secondary | ICD-10-CM | POA: Diagnosis present

## 2020-01-31 HISTORY — PX: THYROIDECTOMY: SHX17

## 2020-01-31 LAB — CBC
HCT: 44.3 % (ref 36.0–46.0)
Hemoglobin: 14.4 g/dL (ref 12.0–15.0)
MCH: 28.9 pg (ref 26.0–34.0)
MCHC: 32.5 g/dL (ref 30.0–36.0)
MCV: 89 fL (ref 80.0–100.0)
Platelets: 213 10*3/uL (ref 150–400)
RBC: 4.98 MIL/uL (ref 3.87–5.11)
RDW: 11.9 % (ref 11.5–15.5)
WBC: 7.6 10*3/uL (ref 4.0–10.5)
nRBC: 0 % (ref 0.0–0.2)

## 2020-01-31 LAB — POCT PREGNANCY, URINE: Preg Test, Ur: NEGATIVE

## 2020-01-31 SURGERY — THYROIDECTOMY
Anesthesia: General | Site: Neck | Laterality: Left

## 2020-01-31 MED ORDER — DEXAMETHASONE SODIUM PHOSPHATE 10 MG/ML IJ SOLN
INTRAMUSCULAR | Status: AC
  Filled 2020-01-31: qty 1

## 2020-01-31 MED ORDER — PROPOFOL 10 MG/ML IV BOLUS
INTRAVENOUS | Status: DC | PRN
  Administered 2020-01-31: 200 mg via INTRAVENOUS

## 2020-01-31 MED ORDER — OXYCODONE HCL 5 MG PO TABS: 5.0000 mg | ORAL_TABLET | Freq: Once | ORAL | Status: DC | PRN

## 2020-01-31 MED ORDER — HYDROCODONE-ACETAMINOPHEN 5-325 MG PO TABS
1.0000 | ORAL_TABLET | ORAL | Status: DC | PRN
  Administered 2020-01-31 – 2020-02-01 (×3): 1 via ORAL
  Filled 2020-01-31 (×2): qty 1
  Filled 2020-01-31: qty 2

## 2020-01-31 MED ORDER — HYDROMORPHONE HCL 1 MG/ML IJ SOLN
INTRAMUSCULAR | Status: AC
  Filled 2020-01-31: qty 1

## 2020-01-31 MED ORDER — PROPOFOL 10 MG/ML IV BOLUS
INTRAVENOUS | Status: AC
  Filled 2020-01-31: qty 20

## 2020-01-31 MED ORDER — MEPERIDINE HCL 25 MG/ML IJ SOLN: 6.2500 mg | INTRAMUSCULAR | Status: DC | PRN

## 2020-01-31 MED ORDER — LIDOCAINE 2% (20 MG/ML) 5 ML SYRINGE
INTRAMUSCULAR | Status: AC
  Filled 2020-01-31: qty 5

## 2020-01-31 MED ORDER — FENTANYL CITRATE (PF) 250 MCG/5ML IJ SOLN
INTRAMUSCULAR | Status: AC
  Filled 2020-01-31: qty 5

## 2020-01-31 MED ORDER — DEXTROSE-NACL 5-0.9 % IV SOLN: INTRAVENOUS | Status: DC

## 2020-01-31 MED ORDER — LIDOCAINE HCL (CARDIAC) PF 100 MG/5ML IV SOSY
PREFILLED_SYRINGE | INTRAVENOUS | Status: DC | PRN
  Administered 2020-01-31: 100 mg via INTRATRACHEAL

## 2020-01-31 MED ORDER — ONDANSETRON HCL 4 MG/2ML IJ SOLN
INTRAMUSCULAR | Status: AC
  Filled 2020-01-31: qty 2

## 2020-01-31 MED ORDER — ROCURONIUM BROMIDE 10 MG/ML (PF) SYRINGE
PREFILLED_SYRINGE | INTRAVENOUS | Status: AC
  Filled 2020-01-31: qty 10

## 2020-01-31 MED ORDER — CEFAZOLIN SODIUM 1 G IJ SOLR
INTRAMUSCULAR | Status: AC
  Filled 2020-01-31: qty 20

## 2020-01-31 MED ORDER — ROCURONIUM BROMIDE 100 MG/10ML IV SOLN
INTRAVENOUS | Status: DC | PRN
  Administered 2020-01-31: 50 mg via INTRAVENOUS

## 2020-01-31 MED ORDER — LIDOCAINE-EPINEPHRINE 1 %-1:100000 IJ SOLN
INTRAMUSCULAR | Status: AC
  Filled 2020-01-31: qty 1

## 2020-01-31 MED ORDER — MIDAZOLAM HCL 5 MG/5ML IJ SOLN
INTRAMUSCULAR | Status: DC | PRN
  Administered 2020-01-31: 2 mg via INTRAVENOUS

## 2020-01-31 MED ORDER — 0.9 % SODIUM CHLORIDE (POUR BTL) OPTIME
TOPICAL | Status: DC | PRN
  Administered 2020-01-31: 1000 mL

## 2020-01-31 MED ORDER — ONDANSETRON HCL 4 MG/2ML IJ SOLN
INTRAMUSCULAR | Status: DC | PRN
  Administered 2020-01-31: 4 mg via INTRAVENOUS

## 2020-01-31 MED ORDER — SUGAMMADEX SODIUM 200 MG/2ML IV SOLN
INTRAVENOUS | Status: DC | PRN
  Administered 2020-01-31: 210 mg via INTRAVENOUS

## 2020-01-31 MED ORDER — HYDROMORPHONE HCL 1 MG/ML IJ SOLN
0.2500 mg | INTRAMUSCULAR | Status: DC | PRN
  Administered 2020-01-31: 0.25 mg via INTRAVENOUS
  Administered 2020-01-31: 0.5 mg via INTRAVENOUS
  Administered 2020-01-31: 0.25 mg via INTRAVENOUS

## 2020-01-31 MED ORDER — FENTANYL CITRATE (PF) 100 MCG/2ML IJ SOLN
INTRAMUSCULAR | Status: DC | PRN
  Administered 2020-01-31 (×3): 50 ug via INTRAVENOUS

## 2020-01-31 MED ORDER — MIDAZOLAM HCL 2 MG/2ML IJ SOLN
INTRAMUSCULAR | Status: AC
  Filled 2020-01-31: qty 2

## 2020-01-31 MED ORDER — DEXAMETHASONE SODIUM PHOSPHATE 10 MG/ML IJ SOLN
INTRAMUSCULAR | Status: DC | PRN
  Administered 2020-01-31: 10 mg via INTRAVENOUS

## 2020-01-31 MED ORDER — LIDOCAINE-EPINEPHRINE 1 %-1:100000 IJ SOLN
INTRAMUSCULAR | Status: DC | PRN
  Administered 2020-01-31: 20 mL

## 2020-01-31 MED ORDER — CEFAZOLIN SODIUM-DEXTROSE 2-3 GM-%(50ML) IV SOLR
INTRAVENOUS | Status: DC | PRN
  Administered 2020-01-31: 2 g via INTRAVENOUS

## 2020-01-31 MED ORDER — LACTATED RINGERS IV SOLN: INTRAVENOUS | Status: DC | PRN

## 2020-01-31 MED ORDER — PROMETHAZINE HCL 25 MG/ML IJ SOLN: 6.2500 mg | INTRAMUSCULAR | Status: DC | PRN

## 2020-01-31 MED ORDER — PROMETHAZINE HCL 25 MG PO TABS: 25.0000 mg | ORAL_TABLET | Freq: Four times a day (QID) | ORAL | Status: DC | PRN

## 2020-01-31 MED ORDER — SCOPOLAMINE 1 MG/3DAYS TD PT72
MEDICATED_PATCH | TRANSDERMAL | Status: DC | PRN
  Administered 2020-01-31: 1 via TRANSDERMAL

## 2020-01-31 MED ORDER — PROMETHAZINE HCL 25 MG RE SUPP
25.0000 mg | Freq: Four times a day (QID) | RECTAL | Status: DC | PRN
  Filled 2020-01-31: qty 1

## 2020-01-31 MED ORDER — OXYCODONE HCL 5 MG/5ML PO SOLN: 5.0000 mg | Freq: Once | ORAL | Status: DC | PRN

## 2020-01-31 MED ORDER — SUMATRIPTAN SUCCINATE 100 MG PO TABS
100.0000 mg | ORAL_TABLET | ORAL | Status: DC | PRN
  Administered 2020-01-31: 100 mg via ORAL
  Filled 2020-01-31 (×2): qty 1

## 2020-01-31 MED ORDER — PHENYLEPHRINE 40 MCG/ML (10ML) SYRINGE FOR IV PUSH (FOR BLOOD PRESSURE SUPPORT)
PREFILLED_SYRINGE | INTRAVENOUS | Status: DC | PRN
  Administered 2020-01-31: 40 ug via INTRAVENOUS

## 2020-01-31 MED ORDER — IBUPROFEN 100 MG/5ML PO SUSP
400.0000 mg | Freq: Four times a day (QID) | ORAL | Status: DC | PRN
  Administered 2020-01-31: 400 mg via ORAL
  Filled 2020-01-31 (×2): qty 20

## 2020-01-31 SURGICAL SUPPLY — 39 items
BLADE SURG 15 STRL LF DISP TIS (BLADE) ×1 IMPLANT
BLADE SURG 15 STRL SS (BLADE) ×2
CANISTER SUCT 3000ML PPV (MISCELLANEOUS) ×2 IMPLANT
CLEANER TIP ELECTROSURG 2X2 (MISCELLANEOUS) ×2 IMPLANT
CNTNR URN SCR LID CUP LEK RST (MISCELLANEOUS) ×1 IMPLANT
CONT SPEC 4OZ STRL OR WHT (MISCELLANEOUS) ×2
CORD BIPOLAR FORCEPS 12FT (ELECTRODE) ×2 IMPLANT
COVER SURGICAL LIGHT HANDLE (MISCELLANEOUS) ×2 IMPLANT
COVER WAND RF STERILE (DRAPES) IMPLANT
DERMABOND ADVANCED (GAUZE/BANDAGES/DRESSINGS) ×1
DERMABOND ADVANCED .7 DNX12 (GAUZE/BANDAGES/DRESSINGS) ×1 IMPLANT
DRAIN HEMOVAC 7FR (DRAIN) ×2 IMPLANT
DRAIN SNY 10 ROU (WOUND CARE) IMPLANT
DRAPE HALF SHEET 40X57 (DRAPES) IMPLANT
ELECT COATED BLADE 2.86 ST (ELECTRODE) ×2 IMPLANT
ELECT REM PT RETURN 9FT ADLT (ELECTROSURGICAL) ×2
ELECTRODE REM PT RTRN 9FT ADLT (ELECTROSURGICAL) ×1 IMPLANT
EVACUATOR SILICONE 100CC (DRAIN) ×4 IMPLANT
FORCEPS BIPOLAR SPETZLER 8 1.0 (NEUROSURGERY SUPPLIES) ×2 IMPLANT
GAUZE 4X4 16PLY RFD (DISPOSABLE) ×2 IMPLANT
GLOVE BIO SURGEON STRL SZ 6.5 (GLOVE) ×2 IMPLANT
GLOVE ECLIPSE 7.5 STRL STRAW (GLOVE) ×2 IMPLANT
GOWN STRL REUS W/ TWL LRG LVL3 (GOWN DISPOSABLE) ×3 IMPLANT
GOWN STRL REUS W/TWL LRG LVL3 (GOWN DISPOSABLE) ×6
KIT BASIN OR (CUSTOM PROCEDURE TRAY) ×2 IMPLANT
KIT TURNOVER KIT B (KITS) ×2 IMPLANT
NEEDLE PRECISIONGLIDE 27X1.5 (NEEDLE) ×2 IMPLANT
NS IRRIG 1000ML POUR BTL (IV SOLUTION) ×2 IMPLANT
PAD ARMBOARD 7.5X6 YLW CONV (MISCELLANEOUS) ×4 IMPLANT
PENCIL FOOT CONTROL (ELECTRODE) ×2 IMPLANT
SHEARS HARMONIC 9CM CVD (BLADE) ×2 IMPLANT
STAPLER VISISTAT 35W (STAPLE) ×2 IMPLANT
SUT CHROMIC 3 0 PS 2 (SUTURE) ×4 IMPLANT
SUT CHROMIC 4 0 PS 2 18 (SUTURE) IMPLANT
SUT ETHILON 3 0 PS 1 (SUTURE) ×4 IMPLANT
SUT SILK 3 0 REEL (SUTURE) ×2 IMPLANT
SUT SILK 4 0 REEL (SUTURE) ×2 IMPLANT
TOWEL GREEN STERILE FF (TOWEL DISPOSABLE) ×2 IMPLANT
TRAY ENT MC OR (CUSTOM PROCEDURE TRAY) ×2 IMPLANT

## 2020-01-31 NOTE — Discharge Instructions (Signed)
You may shower and use soap and water. Do not use any creams, oils or ointment.  Avoid lifting greater than 10 pounds for the next 2 weeks.  Avoid any strenuous activity.  It is okay to walk and stay active as much as you are able to without overdoing it.

## 2020-01-31 NOTE — Interval H&P Note (Signed)
History and Physical Interval Note:  01/31/2020 7:18 AM  Leah Schneider  has presented today for surgery, with the diagnosis of thyroid nodules.  The various methods of treatment have been discussed with the patient and family. After consideration of risks, benefits and other options for treatment, the patient has consented to  Procedure(s): THYROIDECTOMY W/FROZEN SECTION, POSSIBLE TOTAL (Left) as a surgical intervention.  The patient's history has been reviewed, patient examined, no change in status, stable for surgery.  I have reviewed the patient's chart and labs.  Questions were answered to the patient's satisfaction.     Serena Colonel

## 2020-01-31 NOTE — Op Note (Addendum)
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 A shoulder roll was placed beneath the shoulder blades and the neck was extended. The neck was prepped and draped in a standard fashion. A low collar transverse incision was outlined with a marking pen and was incised with electrocautery. Dissection was continued down through the platysma layer.  Self-retaining retractors were used throughout the case.  The midline fascia was divided.  The strap muscles were reflected laterally off the left lobe of the thyroid.  The left thyroid lobe contained a large nodule.  The gland was brought anteriorly inferiorly and the superior vasculature was identified dissected free and ligated using the harmonic dissector and for larger vessels 4-0 silk ties.  Middle thyroid vein was divided in a similar fashion.  The inferior vasculature was treated in a similar fashion as well.  The gland itself was riding relatively low along the trachea.  A portion of the recurrent nerve was identified and preserved.  The rest of it was away from the dissection plane.  Parathyroid glands were not separately identified.  There is ligament was divided as the gland was brought off of the trachea.  The isthmus was divided using harmonic dissector.  The specimen was delivered and sent for pathologic evaluation.  The wound was irrigated with saline.  Hemostasis was  assured using bipolar cautery and additional ties as needed.  The drain was secured in place with a nylon suture. The midline fascia was reapproximated with interrupted chromic suture. The platysma layer was also reapproximated with interrupted chromic suture. A running subcuticular closure was accomplished. Dermabond was used on the skin. The drain was charged. The patient was awakened, extubated and transferred to recovery in stable condition.  Surgical assistant was essential for this operation to assist with retraction, suctioning, identification of bleeders.   PATIENT DISPOSITION:  To PACU, stable

## 2020-01-31 NOTE — Transfer of Care (Signed)
Immediate Anesthesia Transfer of Care Note  Patient: Leah Schneider  Procedure(s) Performed: LEFT THYROID LOBECTOMY WITH FROZEN SECTION (Left Neck)  Patient Location: PACU  Anesthesia Type:General  Level of Consciousness: drowsy and patient cooperative  Airway & Oxygen Therapy: Patient Spontanous Breathing  Post-op Assessment: Report given to RN and Post -op Vital signs reviewed and stable  Post vital signs: Reviewed and stable  Last Vitals:  Vitals Value Taken Time  BP 126/66 01/31/20 0851  Temp 36.3 C 01/31/20 0851  Pulse 64 01/31/20 0854  Resp 20 01/31/20 0854  SpO2 95 % 01/31/20 0854  Vitals shown include unvalidated device data.  Last Pain:  Vitals:   01/31/20 0851  TempSrc:   PainSc: Asleep      Patients Stated Pain Goal: 2 (01/31/20 3094)  Complications: No apparent anesthesia complications

## 2020-01-31 NOTE — Anesthesia Procedure Notes (Signed)
Procedure Name: Intubation Date/Time: 01/31/2020 7:40 AM Performed by: Rosiland Oz, CRNA Pre-anesthesia Checklist: Patient identified, Emergency Drugs available, Suction available, Patient being monitored and Timeout performed Patient Re-evaluated:Patient Re-evaluated prior to induction Oxygen Delivery Method: Circle system utilized Preoxygenation: Pre-oxygenation with 100% oxygen Induction Type: IV induction Ventilation: Mask ventilation without difficulty Laryngoscope Size: Miller and 2 Grade View: Grade I Tube type: Oral Tube size: 7.0 mm Number of attempts: 1 Airway Equipment and Method: Stylet Placement Confirmation: ETT inserted through vocal cords under direct vision,  positive ETCO2 and breath sounds checked- equal and bilateral Secured at: 21 cm Tube secured with: Tape Dental Injury: Teeth and Oropharynx as per pre-operative assessment

## 2020-01-31 NOTE — Progress Notes (Signed)
Pt. Has a piercing in right inner ear. States it doesn't come out. Notified Dr. Renold Don. Stated okay.

## 2020-02-01 ENCOUNTER — Other Ambulatory Visit (HOSPITAL_COMMUNITY): Payer: BC Managed Care – PPO

## 2020-02-01 ENCOUNTER — Encounter: Payer: Self-pay | Admitting: *Deleted

## 2020-02-01 DIAGNOSIS — E041 Nontoxic single thyroid nodule: Secondary | ICD-10-CM | POA: Diagnosis not present

## 2020-02-01 MED ORDER — PROMETHAZINE HCL 25 MG RE SUPP: 25.0000 mg | Freq: Four times a day (QID) | RECTAL | 1 refills | Status: DC | PRN

## 2020-02-01 MED ORDER — HYDROCODONE-ACETAMINOPHEN 7.5-325 MG PO TABS: 1.0000 | ORAL_TABLET | Freq: Four times a day (QID) | ORAL | 0 refills | Status: DC | PRN

## 2020-02-01 NOTE — Discharge Summary (Signed)
Physician Discharge Summary  Patient ID: Leah Schneider MRN: [id number] DOB/AGE: 1986-03-07 33 y.o.  Admit date: 01/31/2020 Discharge date: 02/01/2020  Admission Diagnoses: Thyroid mass  Discharge Diagnoses:  Active Problems:   S/P thyroidectomy   Discharged Condition: good  Hospital Course: No complications  Consults: none  Significant Diagnostic Studies: none  Treatments: surgery: Left thyroid lobectomy  Discharge Exam: Blood pressure (!) 116/59, pulse (!) 59, temperature 97.6 F (36.4 C), temperature source Oral, resp. rate 16, height 5\' 5"  (1.651 m), weight 104.3 kg, last menstrual period 01/27/2020, SpO2 100 %, unknown if currently breastfeeding. PHYSICAL EXAM: Awake and alert, breathing and voice are normal and clear.  Incision looks excellent.  JP drain with serous fluid, removed without difficulty.  Disposition: Discharge disposition: 01-Home or Self Care       Discharge Instructions    Diet - low sodium heart healthy   Complete by: As directed    Increase activity slowly   Complete by: As directed      Allergies as of 02/01/2020   No Known Allergies     Medication List    TAKE these medications   HYDROcodone-acetaminophen 7.5-325 MG tablet Commonly known as: Norco Take 1 tablet by mouth every 6 (six) hours as needed for moderate pain.   Mirena (52 MG) 20 MCG/24HR IUD Generic drug: levonorgestrel 1 each by Intrauterine route once.   OVER THE COUNTER MEDICATION Take 2 tablets by mouth in the morning and at bedtime. Seo Mega   OVER THE COUNTER MEDICATION Take 2 tablets by mouth in the morning and at bedtime. Microplex   OVER THE COUNTER MEDICATION Take 2 tablets by mouth daily. Citrus Blend   OVER THE COUNTER MEDICATION Take 2 tablets by mouth in the morning and at bedtime. Alpha CRS Plus   OVER THE COUNTER MEDICATION Take 2 tablets by mouth in the morning. Mito Max 2   promethazine 25 MG suppository Commonly known as: PHENERGAN Place 1  suppository (25 mg total) rectally every 6 (six) hours as needed for nausea or vomiting.   SUMAtriptan 100 MG tablet Commonly known as: IMITREX TAKE 1 TABLET BY MOUTH ONCE AS NEEDED FOR MIGRAINE. MAY REPEAT IN 2 HOURS IF HEADACHE PERSISTS OR RECURS What changed: See the new instructions.      Follow-up Information    02-25-1977, MD. Schedule an appointment as soon as possible for a visit in 1 week.   Specialty: Otolaryngology Contact information: [address] [place] [phone]           Signed: [phone] 02/01/2020, 8:59 AM

## 2020-02-01 NOTE — Anesthesia Postprocedure Evaluation (Signed)
Anesthesia Post Note  Patient: [name] Performed: LEFT THYROID LOBECTOMY WITH FROZEN SECTION (Left Neck)     Patient location during evaluation: PACU Anesthesia Type: General Level of consciousness: sedated and patient cooperative Pain management: pain level controlled Vital Signs Assessment: post-procedure vital signs reviewed and stable Respiratory status: spontaneous breathing Cardiovascular status: stable Anesthetic complications: no    Last Vitals:  Vitals:   02/01/20 0454 02/01/20 0742  BP: 110/65 (!) 116/59  Pulse: (!) 58 (!) 59  Resp: 18 16  Temp: 36.9 C 36.4 C  SpO2: 99% 100%    Last Pain:  Vitals:   02/01/20 1036  TempSrc:   PainSc: 3                  Lewie Loron

## 2020-02-01 NOTE — Plan of Care (Signed)
Patient alert and oriented, mae's well, voiding adequate amount of urine, swallowing without difficulty, no c/o pain at time of discharge. Patient discharged home with family. Script and discharged instructions given to patient. Patient and family stated understanding of instructions given. Patient has an appointment with Dr. Rosen  

## 2020-02-02 LAB — SURGICAL PATHOLOGY

## 2020-09-17 ENCOUNTER — Encounter (INDEPENDENT_AMBULATORY_CARE_PROVIDER_SITE_OTHER): Payer: Self-pay

## 2020-10-11 ENCOUNTER — Other Ambulatory Visit: Payer: 59

## 2020-10-11 DIAGNOSIS — Z1152 Encounter for screening for COVID-19: Secondary | ICD-10-CM

## 2020-10-13 LAB — NOVEL CORONAVIRUS, NAA: SARS-CoV-2, NAA: NOT DETECTED

## 2020-10-13 LAB — SARS-COV-2, NAA 2 DAY TAT

## 2021-01-30 ENCOUNTER — Encounter (INDEPENDENT_AMBULATORY_CARE_PROVIDER_SITE_OTHER): Payer: Self-pay

## 2021-04-13 IMAGING — US US FNA BIOPSY THYROID 1ST LESION
1 series · 13 of 15 positions shown · non-contrast
Comparison: Thyroid ultrasound 11/16/2019.

MEDICATIONS:
None

COMPLICATIONS:
None immediate.

INDICATION: Indeterminate left mid thyroid nodule

EXAM:
ULTRASOUND GUIDED FINE NEEDLE ASPIRATION OF INDETERMINATE LEFT MID
THYROID NODULE
TECHNIQUE: Informed written consent was obtained from the patient after a
discussion of the risks, benefits and alternatives to treatment.
Questions regarding the procedure were encouraged and answered. A
timeout was performed prior to the initiation of the procedure.

[Series 1: us fna biopsy thyroid 1st lesion · 0.07mm/px · 15 acquisitions, 13 frames shown]
[im 1/15]
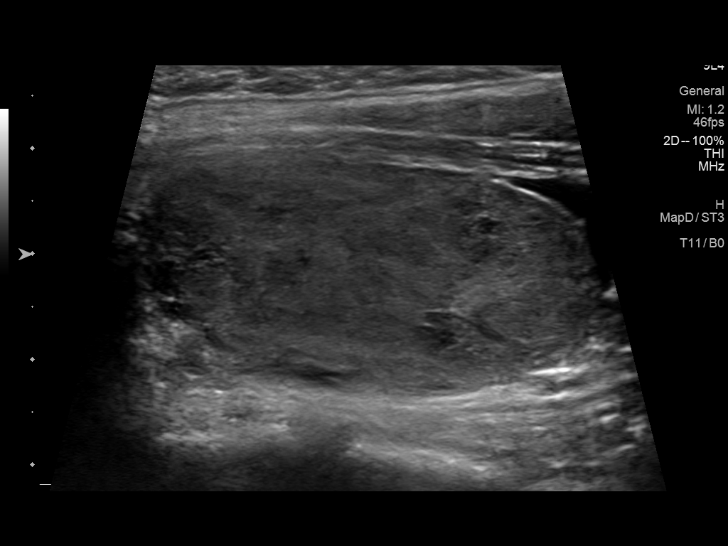
[im 2/15]
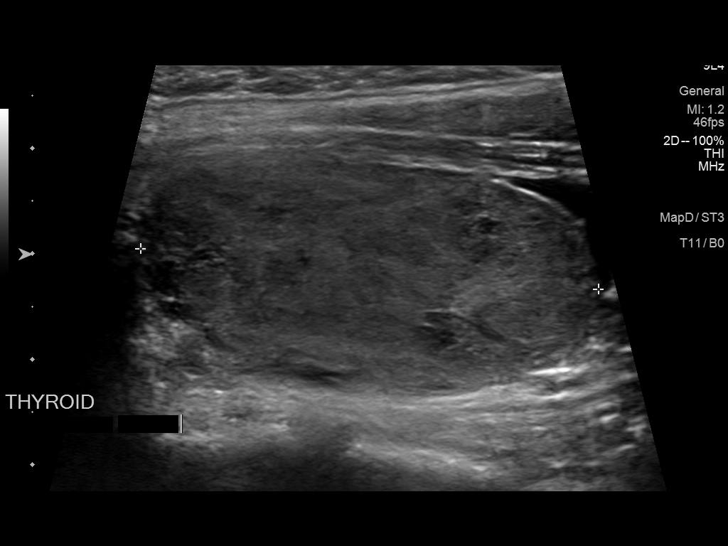
[im 3/15]
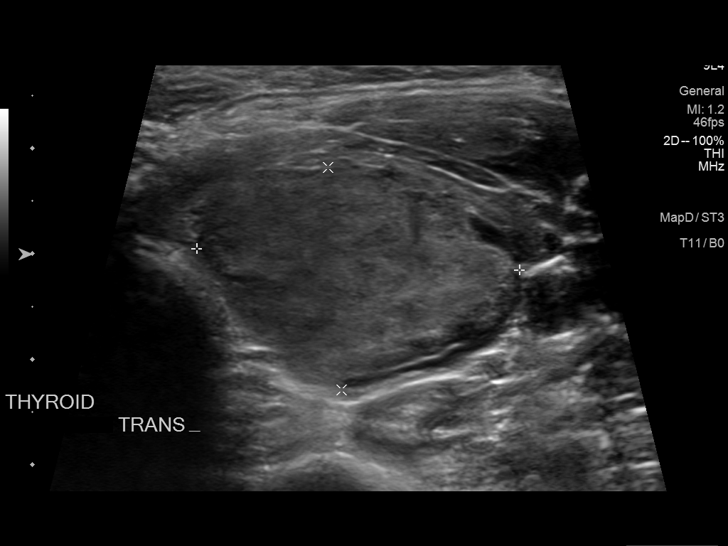
[im 5/15]
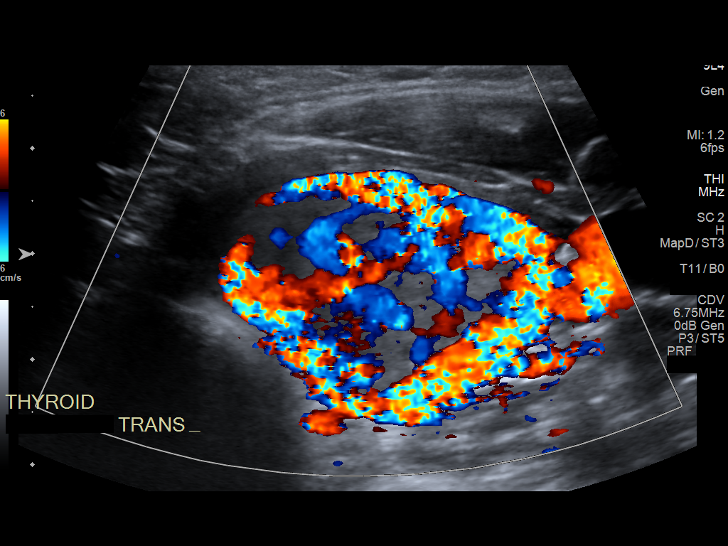
[im 6/15]
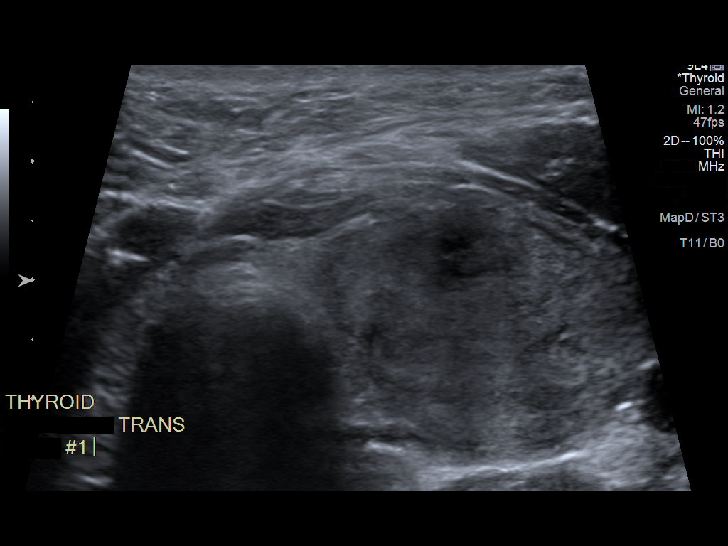
[im 7/15]
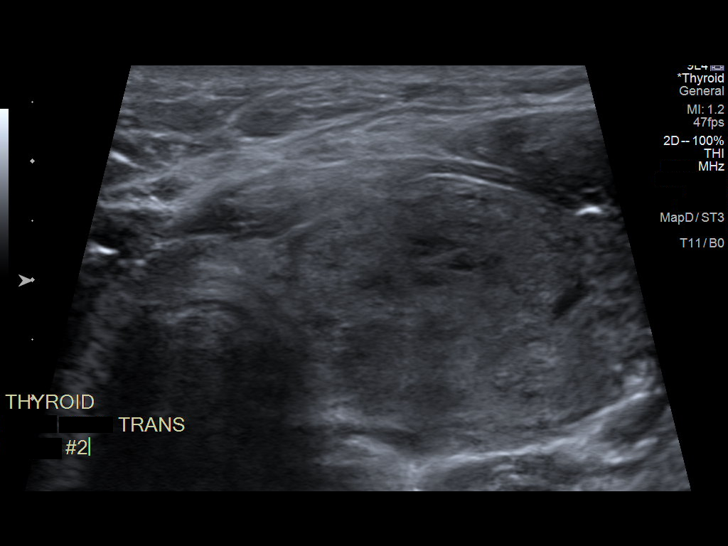
[im 8/15]
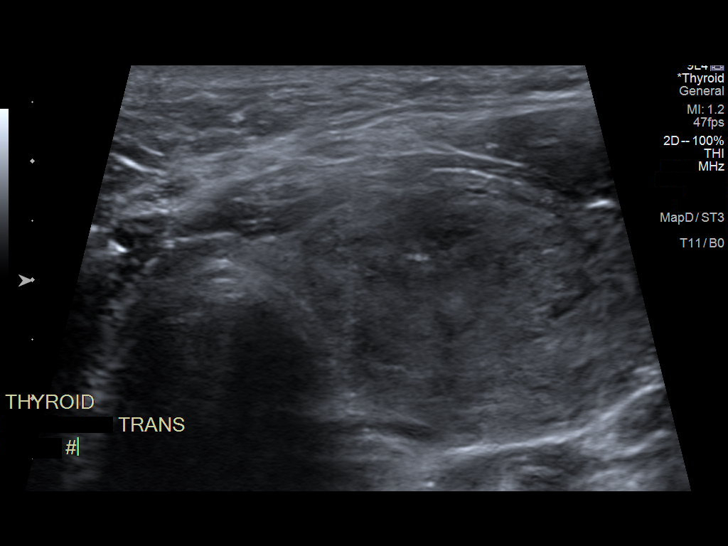
[im 9/15]
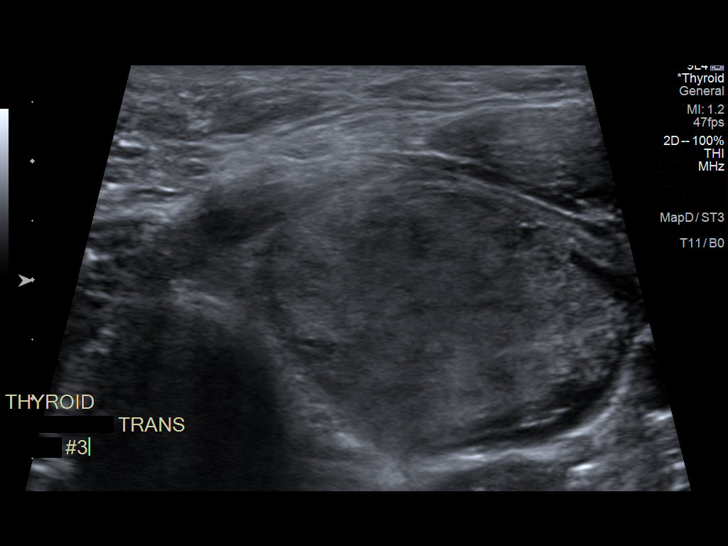
[im 10/15]
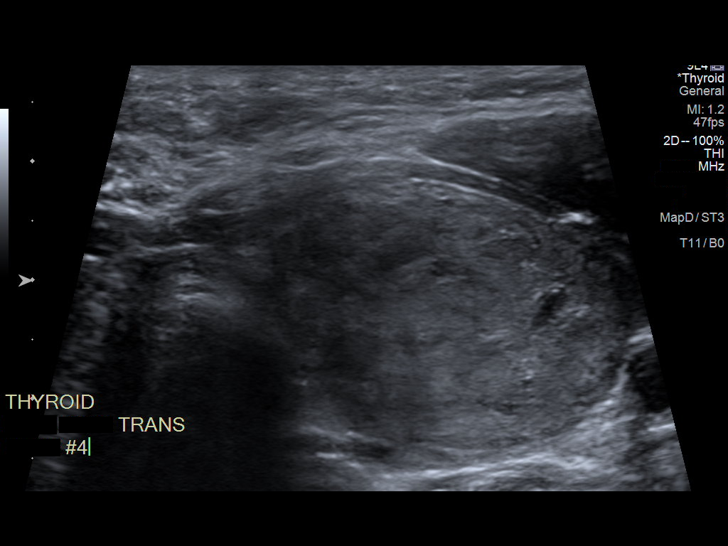
[im 11/15]
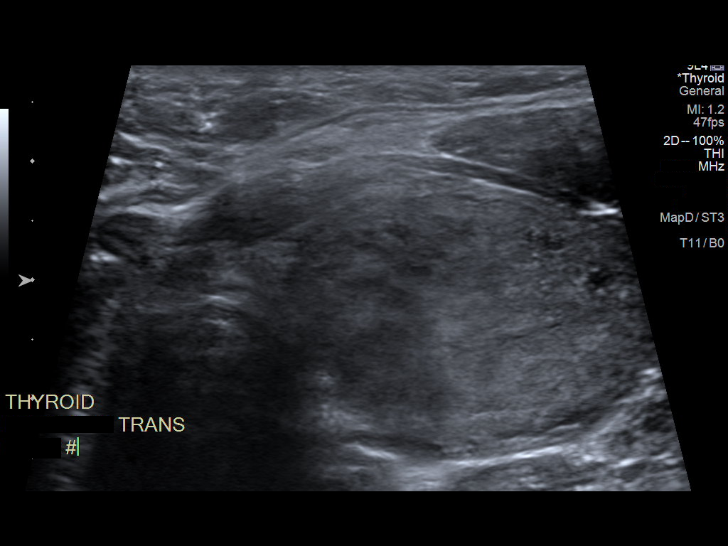
[im 13/15]
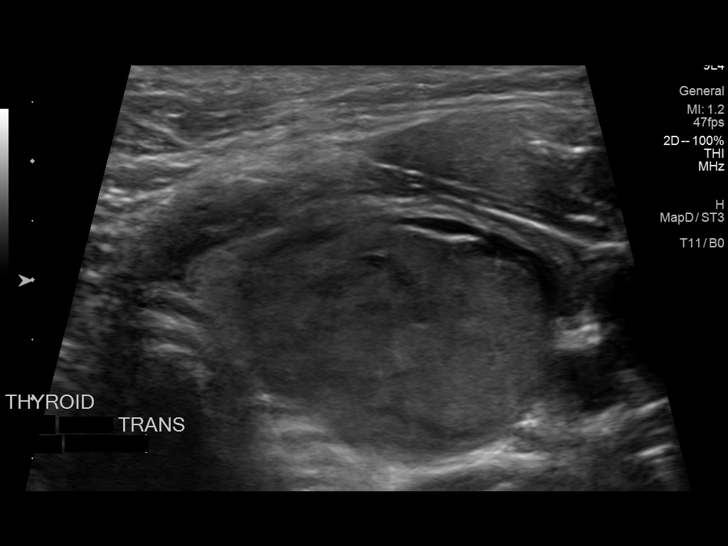
[im 14/15]
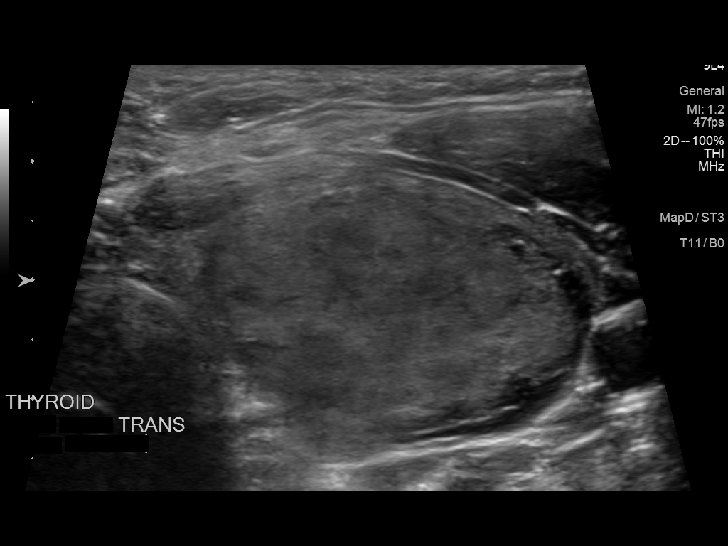
[im 15/15]
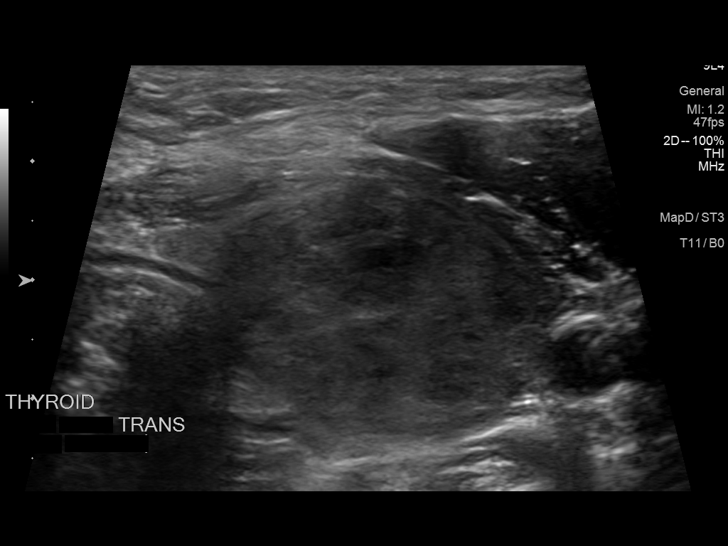

[13 of 15 positions shown; findings below may reference images not displayed]

Pre-procedural ultrasound scanning demonstrated unchanged size and
appearance of the indeterminate nodule within the left mid thyroid
lobe

The procedure was planned. The neck was prepped in the usual sterile
fashion, and a sterile drape was applied covering the operative
field. A timeout was performed prior to the initiation of the
procedure. Local anesthesia was provided with 1% lidocaine.

Under direct ultrasound guidance, 5 FNA biopsies were performed of
the left mid thyroid nodule with a 25 gauge needle. Multiple
ultrasound images were saved for procedural documentation purposes.
The samples were prepared and submitted to pathology.

Limited post procedural scanning was negative for hematoma or
additional complication. Dressings were placed. The patient
tolerated the above procedures procedure well without immediate
postprocedural complication.
FINDINGS: FINDINGS
Nodule reference number based on prior diagnostic ultrasound: 1

Maximum size: 4 cm

Location: Left  ;  Mid

ACR TI-RADS total points: 3

ACR TI-RADS risk category:  TR3 (3 points)

Prior biopsy:  Yes

Reason for biopsy: meets ACR TI-RADS criteria

Ultrasound imaging confirms appropriate placement of the needles
within the thyroid nodule.
IMPRESSION: Technically successful ultrasound guided fine needle aspiration of
left mid thyroid nodule as described above.

## 2021-05-08 ENCOUNTER — Other Ambulatory Visit: Payer: Self-pay

## 2021-05-08 ENCOUNTER — Encounter (INDEPENDENT_AMBULATORY_CARE_PROVIDER_SITE_OTHER): Payer: Self-pay | Admitting: Bariatrics

## 2021-05-08 ENCOUNTER — Ambulatory Visit (INDEPENDENT_AMBULATORY_CARE_PROVIDER_SITE_OTHER): Payer: 59 | Admitting: Bariatrics

## 2021-05-08 VITALS — BP 108/75 | HR 63 | Temp 97.9°F | Ht 65.0 in | Wt 241.0 lb

## 2021-05-08 DIAGNOSIS — Z9189 Other specified personal risk factors, not elsewhere classified: Secondary | ICD-10-CM

## 2021-05-08 DIAGNOSIS — E038 Other specified hypothyroidism: Secondary | ICD-10-CM

## 2021-05-08 DIAGNOSIS — K581 Irritable bowel syndrome with constipation: Secondary | ICD-10-CM | POA: Diagnosis not present

## 2021-05-08 DIAGNOSIS — Z0289 Encounter for other administrative examinations: Secondary | ICD-10-CM

## 2021-05-08 DIAGNOSIS — R7309 Other abnormal glucose: Secondary | ICD-10-CM

## 2021-05-08 DIAGNOSIS — E559 Vitamin D deficiency, unspecified: Secondary | ICD-10-CM

## 2021-05-08 DIAGNOSIS — R0602 Shortness of breath: Secondary | ICD-10-CM

## 2021-05-08 DIAGNOSIS — Z6841 Body Mass Index (BMI) 40.0 and over, adult: Secondary | ICD-10-CM

## 2021-05-08 DIAGNOSIS — R5383 Other fatigue: Secondary | ICD-10-CM | POA: Diagnosis not present

## 2021-05-08 DIAGNOSIS — Z1331 Encounter for screening for depression: Secondary | ICD-10-CM

## 2021-05-08 DIAGNOSIS — E7800 Pure hypercholesterolemia, unspecified: Secondary | ICD-10-CM

## 2021-05-09 LAB — COMPREHENSIVE METABOLIC PANEL
ALT: 17 IU/L (ref 0–32)
AST: 17 IU/L (ref 0–40)
Albumin/Globulin Ratio: 1.5 (ref 1.2–2.2)
Albumin: 4.4 g/dL (ref 3.8–4.8)
Alkaline Phosphatase: 123 IU/L — ABNORMAL HIGH (ref 44–121)
BUN/Creatinine Ratio: 18 (ref 9–23)
BUN: 12 mg/dL (ref 6–20)
Bilirubin Total: 0.5 mg/dL (ref 0.0–1.2)
CO2: 22 mmol/L (ref 20–29)
Calcium: 10 mg/dL (ref 8.7–10.2)
Chloride: 98 mmol/L (ref 96–106)
Creatinine, Ser: 0.68 mg/dL (ref 0.57–1.00)
Globulin, Total: 2.9 g/dL (ref 1.5–4.5)
Glucose: 85 mg/dL (ref 65–99)
Potassium: 4.5 mmol/L (ref 3.5–5.2)
Sodium: 136 mmol/L (ref 134–144)
Total Protein: 7.3 g/dL (ref 6.0–8.5)
eGFR: 116 mL/min/{1.73_m2} (ref >59)

## 2021-05-09 LAB — CBC WITH DIFFERENTIAL/PLATELET
Basophils Absolute: 0.1 10*3/uL (ref 0.0–0.2)
Basos: 1 %
EOS (ABSOLUTE): 0.1 10*3/uL (ref 0.0–0.4)
Eos: 2 %
Hematocrit: 45.9 % (ref 34.0–46.6)
Hemoglobin: 15.1 g/dL (ref 11.1–15.9)
Immature Grans (Abs): 0 10*3/uL (ref 0.0–0.1)
Immature Granulocytes: 0 %
Lymphocytes Absolute: 2.3 10*3/uL (ref 0.7–3.1)
Lymphs: 33 %
MCH: 28.9 pg (ref 26.6–33.0)
MCHC: 32.9 g/dL (ref 31.5–35.7)
MCV: 88 fL (ref 79–97)
Monocytes Absolute: 0.6 10*3/uL (ref 0.1–0.9)
Monocytes: 8 %
Neutrophils Absolute: 4 10*3/uL (ref 1.4–7.0)
Neutrophils: 56 %
Platelets: 243 10*3/uL (ref 150–450)
RBC: 5.23 x10E6/uL (ref 3.77–5.28)
RDW: 11.8 % (ref 11.7–15.4)
WBC: 7.2 10*3/uL (ref 3.4–10.8)

## 2021-05-09 LAB — LIPID PANEL WITH LDL/HDL RATIO
Cholesterol, Total: 174 mg/dL (ref 100–199)
HDL: 47 mg/dL (ref >39)
LDL Chol Calc (NIH): 105 mg/dL — ABNORMAL HIGH (ref 0–99)
LDL/HDL Ratio: 2.2 ratio (ref 0.0–3.2)
Triglycerides: 123 mg/dL (ref 0–149)
VLDL Cholesterol Cal: 22 mg/dL (ref 5–40)

## 2021-05-09 LAB — VITAMIN D 25 HYDROXY (VIT D DEFICIENCY, FRACTURES): Vit D, 25-Hydroxy: 60.1 ng/mL (ref 30.0–100.0)

## 2021-05-09 LAB — HEMOGLOBIN A1C
Est. average glucose Bld gHb Est-mCnc: 105 mg/dL
Hgb A1c MFr Bld: 5.3 % (ref 4.8–5.6)

## 2021-05-09 LAB — T4, FREE: Free T4: 1.32 ng/dL (ref 0.82–1.77)

## 2021-05-09 LAB — T3: T3, Total: 143 ng/dL (ref 71–180)

## 2021-05-09 LAB — INSULIN, RANDOM: INSULIN: 21.1 u[IU]/mL (ref 2.6–24.9)

## 2021-05-09 LAB — TSH: TSH: 2.06 u[IU]/mL (ref 0.450–4.500)

## 2021-05-10 ENCOUNTER — Encounter (INDEPENDENT_AMBULATORY_CARE_PROVIDER_SITE_OTHER): Payer: Self-pay | Admitting: Bariatrics

## 2021-05-10 NOTE — Progress Notes (Signed)
Dear Dr. Hyacinth Meeker,   Thank you for referring Leah Schneider to our clinic. The following note includes my evaluation and treatment recommendations.  Chief Complaint:   OBESITY Leah Schneider (MR# [id number]) is a 35 y.o. female who presents for evaluation and treatment of obesity and related comorbidities. Current BMI is Body mass index is 40.1 kg/m. Leah Schneider has been struggling with her weight for many years and has been unsuccessful in either losing weight, maintaining weight loss, or reaching her healthy weight goal.  Leah Schneider is currently in the action stage of change and ready to dedicate time achieving and maintaining a healthier weight. Leah Schneider is interested in becoming our patient and working on intensive lifestyle modifications including (but not limited to) diet and exercise for weight loss.  Leah Schneider does like to cook but notes lack of the energy. She craves carbohydrates.  Leah Schneider habits were reviewed today and are as follows: Her family eats meals together, she thinks her family will eat healthier with her, her desired weight loss is 131 lbs, she has been heavy most of her life, she started gaining weight after having kids, her heaviest weight ever was 255 pounds, she is a picky eater and doesn't like to eat healthier foods, she has significant food cravings issues, she snacks frequently in the evenings, she skips meals frequently, she is frequently drinking liquids with calories, she frequently makes poor food choices, she frequently eats larger portions than normal, she has binge eating behaviors, and she struggles with emotional eating.  Depression Screen Leah Schneider Food and Mood (modified PHQ-9) score was 14.  Depression screen PHQ 2/9 05/08/2021  Decreased Interest 2  Down, Depressed, Hopeless 2  PHQ - 2 Score 4  Altered sleeping 1  Tired, decreased energy 3  Change in appetite 2  Feeling bad or failure about yourself  1  Trouble concentrating 3  Moving slowly or  fidgety/restless 0  Suicidal thoughts 0  PHQ-9 Score 14  Difficult doing work/chores Not difficult at all   Subjective:   1. Other fatigue Leah Schneider admits to daytime somnolence and admits to waking up still tired. Patent has a history of symptoms of daytime fatigue, morning fatigue, and morning headache. Leah Schneider generally gets  ??no answer  hours of sleep per night, and states that she has poor sleep quality. Snoring is present. Apneic episodes are not present. Epworth Sleepiness Score is 8.  2. SOB (shortness of breath) on exertion Leah Schneider notes increasing shortness of breath with exercising and seems to be worsening over time with weight gain. She notes getting out of breath sooner with activity than she used to. This has gotten worse recently. Leah Schneider denies shortness of breath at rest or orthopnea.  3. Other specified hypothyroidism Leah Schneider had a left side thyroidectomy in 2021. She is taking Synthroid.  4. Irritable bowel syndrome with constipation She is not taking medication.  5. Vitamin D deficiency Leah Schneider is not taking Vit D.  6. Elevated glucose Leah Schneider has a history of elevated glucose.  7. Elevated cholesterol She has a history of elevated cholesterol.  8. At risk for activity intolerance Leah Schneider is at risk or exercise intolerance due to obesity.  Assessment/Plan:   1. Other fatigue Leah Schneider does feel that her weight is causing her energy to be lower than it should be. Fatigue may be related to obesity, depression or many other causes. Labs will be ordered, and in the meanwhile, Leah Schneider will focus on self care including making healthy food choices, increasing physical activity and  focusing on stress reduction. Check labs today.  - EKG 12-Lead - CBC with Differential/Platelet - Comprehensive metabolic panel - T3 - T4, free - TSH  2. SOB (shortness of breath) on exertion Leah Schneider does feel that she gets out of breath more easily that she used to when she  exercises. Leah Schneider's shortness of breath appears to be obesity related and exercise induced. She has agreed to work on weight loss and gradually increase exercise to treat her exercise induced shortness of breath. Will continue to monitor closely.  3. Other specified hypothyroidism Leah Schneider will continue Synthroid. Patient with long-standing hypothyroidism, on levothyroxine therapy. She appears euthyroid. Orders and follow up as documented in patient record.  Counseling Good thyroid control is important for overall health. Supratherapeutic thyroid levels are dangerous and will not improve weight loss results. The correct way to take levothyroxine is fasting, with water, separated by at least 30 minutes from breakfast, and separated by more than 4 hours from calcium, iron, multivitamins, acid reflux medications (PPIs).   4. Irritable bowel syndrome with constipation Increase water intake and fiber.  5. Vitamin D deficiency Low Vitamin D level contributes to fatigue and are associated with obesity, breast, and colon cancer. She agrees to follow-up for routine testing of Vitamin D, at least 2-3 times per year to avoid over-replacement. Check labs today.  - VITAMIN D 25 Hydroxy (Vit-D Deficiency, Fractures)  6. Elevated glucose Fasting labs will be obtained and results with be discussed with Leah Schneider in 2 weeks at her follow up visit. In the meanwhile Leah Schneider was started on a lower simple carbohydrate diet and will work on weight loss efforts.  - Hemoglobin A1c - Insulin, random  7. Elevated cholesterol Cardiovascular risk and specific lipid/LDL goals reviewed.  We discussed several lifestyle modifications today and Leah Schneider will continue to work on diet, exercise and weight loss efforts. Orders and follow up as documented in patient record.   Counseling Intensive lifestyle modifications are the first line treatment for this issue. Dietary changes: Increase soluble fiber. Decrease simple  carbohydrates. Exercise changes: Moderate to vigorous-intensity aerobic activity 150 minutes per week if tolerated. Lipid-lowering medications: see documented in medical record. Check labs today.  - Lipid Panel With LDL/HDL Ratio  8. At risk for activity intolerance Leah Schneider was given approximately 15 minutes of exercise intolerance counseling today. She is 35 y.o. female and has risk factors exercise intolerance including obesity. We discussed intensive lifestyle modifications today with an emphasis on specific weight loss instructions and strategies. Leah Schneider will slowly increase activity as tolerated.  Repetitive spaced learning was employed today to elicit superior memory formation and behavioral change.   9. Depression screen Leah Schneider had a positive depression screening. Depression is commonly associated with obesity and often results in emotional eating behaviors. We will monitor this closely and work on CBT to help improve the non-hunger eating patterns. Referral to Psychology may be required if no improvement is seen as she continues in our clinic.  10. Class 3 severe obesity with serious comorbidity and body mass index (BMI) of 40.0 to 44.9 in adult, unspecified obesity type (HCC)  Leah Schneider is currently in the action stage of change and her goal is to continue with weight loss efforts. I recommend Leah Schneider begin the structured treatment plan as follows:  She has agreed to the Category 2 Plan.  Meal plan Intentional eating  Exercise goals: No exercise has been prescribed at this time.   Behavioral modification strategies: increasing lean protein intake, decreasing simple carbohydrates, increasing vegetables,  increasing water intake, decreasing eating out, no skipping meals, meal planning and cooking strategies, keeping healthy foods in the home, and planning for success.  She was informed of the importance of frequent follow-up visits to maximize her success with intensive lifestyle  modifications for her multiple health conditions. She was informed we would discuss her lab results at her next visit unless there is a critical issue that needs to be addressed sooner. Leah Schneider agreed to keep her next visit at the agreed upon time to discuss these results.  Objective:   Blood pressure 108/75, pulse 63, temperature 97.9 F (36.6 C), height 5\' 5"  (1.651 m), weight 241 lb (109.3 kg), last menstrual period 04/27/2021, SpO2 99 %, unknown if currently breastfeeding. Body mass index is 40.1 kg/m.  EKG: Normal sinus rhythm, rate 81.  Indirect Calorimeter completed today shows a VO2 of 262 and a REE of 1824.  Her calculated basal metabolic rate is 06/28/2021 thus her basal metabolic rate is worse than expected.  General: Cooperative, alert, well developed, in no acute distress. HEENT: Conjunctivae and lids unremarkable. Cardiovascular: Regular rhythm.  Lungs: Normal work of breathing. Neurologic: No focal deficits.   Lab Results  Component Value Date   CREATININE 0.68 05/08/2021   BUN 12 05/08/2021   NA 136 05/08/2021   K 4.5 05/08/2021   CL 98 05/08/2021   CO2 22 05/08/2021   Lab Results  Component Value Date   ALT 17 05/08/2021   AST 17 05/08/2021   ALKPHOS 123 (H) 05/08/2021   BILITOT 0.5 05/08/2021   Lab Results  Component Value Date   HGBA1C 5.3 05/08/2021   Lab Results  Component Value Date   INSULIN 21.1 05/08/2021   Lab Results  Component Value Date   TSH 2.060 05/08/2021   Lab Results  Component Value Date   CHOL 174 05/08/2021   HDL 47 05/08/2021   LDLCALC 105 (H) 05/08/2021   TRIG 123 05/08/2021   Lab Results  Component Value Date   WBC 7.2 05/08/2021   HGB 15.1 05/08/2021   HCT 45.9 05/08/2021   MCV 88 05/08/2021   PLT 243 05/08/2021    Attestation Statements:   Reviewed by clinician on day of visit: allergies, medications, problem list, medical history, surgical history, family history, social history, and previous encounter notes.  07/09/2021, CMA, am acting as Edmund Hilda for Energy manager, DO.  I have reviewed the above documentation for accuracy and completeness, and I agree with the above. [employer], DO

## 2021-05-22 ENCOUNTER — Ambulatory Visit (INDEPENDENT_AMBULATORY_CARE_PROVIDER_SITE_OTHER): Payer: 59 | Admitting: Bariatrics

## 2021-05-22 ENCOUNTER — Other Ambulatory Visit: Payer: Self-pay

## 2021-05-22 ENCOUNTER — Encounter (INDEPENDENT_AMBULATORY_CARE_PROVIDER_SITE_OTHER): Payer: Self-pay | Admitting: Bariatrics

## 2021-05-22 VITALS — BP 122/83 | HR 70 | Temp 98.4°F | Ht 65.0 in | Wt 236.0 lb

## 2021-05-22 DIAGNOSIS — Z9189 Other specified personal risk factors, not elsewhere classified: Secondary | ICD-10-CM | POA: Diagnosis not present

## 2021-05-22 DIAGNOSIS — E8881 Metabolic syndrome: Secondary | ICD-10-CM | POA: Diagnosis not present

## 2021-05-22 DIAGNOSIS — E038 Other specified hypothyroidism: Secondary | ICD-10-CM | POA: Diagnosis not present

## 2021-05-22 DIAGNOSIS — Z6841 Body Mass Index (BMI) 40.0 and over, adult: Secondary | ICD-10-CM

## 2021-05-27 ENCOUNTER — Encounter (INDEPENDENT_AMBULATORY_CARE_PROVIDER_SITE_OTHER): Payer: Self-pay | Admitting: Bariatrics

## 2021-05-27 NOTE — Progress Notes (Signed)
Chief Complaint:   OBESITY Leah Schneider is here to discuss her progress with her obesity treatment plan along with follow-up of her obesity related diagnoses. Leah Schneider is on the Category 2 Plan and states she is following her eating plan approximately 70% of the time. Leah Schneider states she is walking 2 miles 6 times per week.  Today's visit was #: 2 Starting weight: 241 lbs Starting date: 05/08/2021 Today's weight: 236 lbs Today's date: 05/22/2021 Total lbs lost to date: 5 lbs Total lbs lost since last in-office visit: 5 lbs  Interim History: Leah Schneider is down 5 lbs since her last visit. She did not struggle with the plan.  Subjective:   1. Insulin resistance Leah Schneider has a diagnosis of insulin resistance based on her elevated fasting insulin level >5. She continues to work on diet and exercise to decrease her risk of diabetes.Labs discussed. Leah Schneider in not currently taking any medications.   Lab Results  Component Value Date   INSULIN 21.1 05/08/2021   Lab Results  Component Value Date   HGBA1C 5.3 05/08/2021   2. Other specified hypothyroidism Leah Schneider is currently taking Synthroid .  3. At risk of diabetes mellitus Leah Schneider is at higher than average risk for developing diabetes due to obesity and insulin resistant.   Assessment/Plan:   1. Insulin resistance Leah Schneider will continue to work on weight loss, exercise, and decreasing simple carbohydrates to help decrease the risk of diabetes. Leah Schneider agreed to follow-up with [place] as directed to closely monitor her progress.Leah Schneider will decrease carbohydrates, increase healthy fats and protein.  Insulin resistant handout provided to Leah Schneider.  2. Other specified hypothyroidism Patient with long-standing hypothyroidism, on levothyroxine therapy. She appears euthyroid. Orders and follow up as documented in patient record. Leah Schneider will continue taking Synthroid.  Counseling Good thyroid control is important for overall health.  Supratherapeutic thyroid levels are dangerous and will not improve weight loss results. The correct way to take levothyroxine is fasting, with water, separated by at least 30 minutes from breakfast, and separated by more than 4 hours from calcium, iron, multivitamins, acid reflux medications (PPIs).    3. At risk of diabetes mellitus Leah Schneider was given approximately 15 minutes of diabetes education and counseling today. We discussed intensive lifestyle modifications today with an emphasis on weight loss as well as increasing exercise and decreasing simple carbohydrates in her diet. We also reviewed medication options with an emphasis on risk versus benefit of those discussed.   Repetitive spaced learning was employed today to elicit superior memory formation and behavioral change.   4. Obesity, current BMI 39.3 Leah Schneider is currently in the action stage of change. As such, her goal is to continue with weight loss efforts. She has agreed to the Category 2 Plan.   Meal planing Reviewed labs from 05/08/21 (CMP, Lipid panel, Vit D, A1C, Thyroid panel) "Eating Out" sheet provided to Leah Schneider  Exercise goals: As is.  Behavioral modification strategies: increasing lean protein intake, decreasing simple carbohydrates, increasing vegetables, increasing water intake, decreasing eating out, no skipping meals, meal planning and cooking strategies, keeping healthy foods in the home, and planning for success.  Leah Schneider has agreed to follow-up with our clinic in 2 weeks. She was informed of the importance of frequent follow-up visits to maximize her success with intensive lifestyle modifications for her multiple health conditions.   Objective:   Blood pressure 122/83, pulse 70, temperature 98.4 F (36.9 C), height 5\' 5"  (1.651 m), weight 236 lb (107 kg), last menstrual period 04/27/2021, SpO2  99 %, unknown if currently breastfeeding. Body mass index is 39.27 kg/m.  General: Cooperative, alert, well developed,  in no acute distress. Presents with boot on left foot and ankle due to bone spur. HEENT: Conjunctivae and lids unremarkable. Cardiovascular: Regular rhythm.  Lungs: Normal work of breathing. Neurologic: No focal deficits.   Lab Results  Component Value Date   CREATININE 0.68 05/08/2021   BUN 12 05/08/2021   NA 136 05/08/2021   K 4.5 05/08/2021   CL 98 05/08/2021   CO2 22 05/08/2021   Lab Results  Component Value Date   ALT 17 05/08/2021   AST 17 05/08/2021   ALKPHOS 123 (H) 05/08/2021   BILITOT 0.5 05/08/2021   Lab Results  Component Value Date   HGBA1C 5.3 05/08/2021   Lab Results  Component Value Date   INSULIN 21.1 05/08/2021   Lab Results  Component Value Date   TSH 2.060 05/08/2021   Lab Results  Component Value Date   CHOL 174 05/08/2021   HDL 47 05/08/2021   LDLCALC 105 (H) 05/08/2021   TRIG 123 05/08/2021   Lab Results  Component Value Date   VD25OH 60.1 05/08/2021   Lab Results  Component Value Date   WBC 7.2 05/08/2021   HGB 15.1 05/08/2021   HCT 45.9 05/08/2021   MCV 88 05/08/2021   PLT 243 05/08/2021   No results found for: IRON, TIBC, FERRITIN  Attestation Statements:   Reviewed by clinician on day of visit: allergies, medications, problem list, medical history, surgical history, family history, social history, and previous encounter notes.  IPaulla Fore, CMA, am acting as transcriptionist for Dr. Corinna Capra, DO.  I have reviewed the above documentation for accuracy and completeness, and I agree with the above. Corinna Capra, DO

## 2021-05-31 ENCOUNTER — Ambulatory Visit (INDEPENDENT_AMBULATORY_CARE_PROVIDER_SITE_OTHER): Payer: BC Managed Care – PPO | Admitting: Bariatrics

## 2021-06-04 ENCOUNTER — Ambulatory Visit (INDEPENDENT_AMBULATORY_CARE_PROVIDER_SITE_OTHER): Payer: 59 | Admitting: Bariatrics

## 2021-06-04 ENCOUNTER — Other Ambulatory Visit: Payer: Self-pay

## 2021-06-04 ENCOUNTER — Encounter (INDEPENDENT_AMBULATORY_CARE_PROVIDER_SITE_OTHER): Payer: Self-pay | Admitting: Bariatrics

## 2021-06-04 VITALS — BP 111/63 | HR 87 | Temp 98.4°F | Ht 65.0 in | Wt 232.0 lb

## 2021-06-04 DIAGNOSIS — Z6841 Body Mass Index (BMI) 40.0 and over, adult: Secondary | ICD-10-CM | POA: Diagnosis not present

## 2021-06-04 DIAGNOSIS — E8881 Metabolic syndrome: Secondary | ICD-10-CM

## 2021-06-04 DIAGNOSIS — E559 Vitamin D deficiency, unspecified: Secondary | ICD-10-CM | POA: Diagnosis not present

## 2021-06-06 ENCOUNTER — Encounter (INDEPENDENT_AMBULATORY_CARE_PROVIDER_SITE_OTHER): Payer: Self-pay | Admitting: Bariatrics

## 2021-06-06 NOTE — Progress Notes (Signed)
Chief Complaint:   OBESITY [name] is here to discuthe Category 2 Planss her progress with her obesity treatment plan along with follow-up of her obesity related diagnoses. [name] is on the Category 2 Plan and states she is following her eating plan approximately 95% of the time. [name] states she is walking for 60 minutes 6 times per week.  Today's visit was #: 3 Starting weight: 241 lbs Starting date: 05/08/2021 Today's weight: 232 lbs Today's date: 06/04/2021 Total lbs lost to date: 9 Total lbs lost since last in-office visit: 4  Interim History: [name] is back at her job. She will be packing her lunch. She will be able to get enough water in.  Subjective:   1. Vitamin D deficiency [name] is taking Vit D OTC.  2. Insulin resistance [name] is not currently on medications.  Assessment/Plan:   1. Vitamin D deficiency Low Vitamin D level contributes to fatigue and are associated with obesity, breast, and colon cancer. [name] will continue OTC Vitamin D and will follow-up for routine testing of Vitamin D, at least 2-3 times per year to avoid over-replacement.  2. Insulin resistance [name] will continue exercise, and decreasing simple carbohydrates (both starches and sweets) to help decrease the risk of diabetes. [name] agreed to follow-up with [place] as directed to closely monitor her progress.  3. Obesity, current BMI 38.7 [name] is currently in the action stage of change. As such, her goal is to continue with weight loss efforts. She has agreed to the Category 2 Plan.   Mindful eating was discussed. Recipes II handout was given.  Exercise goals: As is.  Behavioral modification strategies: increasing lean protein intake, decreasing simple carbohydrates, increasing vegetables, increasing water intake, decreasing eating out, no skipping meals, meal planning and cooking strategies, keeping healthy foods in the home, and planning for success.  [name] has agreed to  follow-up with our clinic in 2 weeks. She was informed of the importance of frequent follow-up visits to maximize her success with intensive lifestyle modifications for her multiple health conditions.   Objective:   Blood pressure 111/63, pulse 87, temperature 98.4 F (36.9 C), height 5\' 5"  (1.651 m), weight 232 lb (105.2 kg), SpO2 97 %, unknown if currently breastfeeding. Body mass index is 38.61 kg/m.  General: Cooperative, alert, well developed, in no acute distress. HEENT: Conjunctivae and lids unremarkable. Cardiovascular: Regular rhythm.  Lungs: Normal work of breathing. Neurologic: No focal deficits.   Lab Results  Component Value Date   CREATININE 0.68 05/08/2021   BUN 12 05/08/2021   NA 136 05/08/2021   K 4.5 05/08/2021   CL 98 05/08/2021   CO2 22 05/08/2021   Lab Results  Component Value Date   ALT 17 05/08/2021   AST 17 05/08/2021   ALKPHOS 123 (H) 05/08/2021   BILITOT 0.5 05/08/2021   Lab Results  Component Value Date   HGBA1C 5.3 05/08/2021   Lab Results  Component Value Date   INSULIN 21.1 05/08/2021   Lab Results  Component Value Date   TSH 2.060 05/08/2021   Lab Results  Component Value Date   CHOL 174 05/08/2021   HDL 47 05/08/2021   LDLCALC 105 (H) 05/08/2021   TRIG 123 05/08/2021   Lab Results  Component Value Date   VD25OH 60.1 05/08/2021   Lab Results  Component Value Date   WBC 7.2 05/08/2021   HGB 15.1 05/08/2021   HCT 45.9 05/08/2021   MCV 88 05/08/2021   PLT 243 05/08/2021  No results found for: IRON, TIBC, FERRITIN  Attestation Statements:   Reviewed by clinician on day of visit: allergies, medications, problem list, medical history, surgical history, family history, social history, and previous encounter notes.  Time spent on visit including pre-visit chart review and post-visit care and charting was 20 minutes.    Trude Mcburney, am acting as Energy manager for [employer], DO.  I have reviewed the above  documentation for accuracy and completeness, and I agree with the above. Corinna Capra, DO

## 2021-06-12 ENCOUNTER — Encounter (INDEPENDENT_AMBULATORY_CARE_PROVIDER_SITE_OTHER): Payer: Self-pay

## 2021-06-14 ENCOUNTER — Ambulatory Visit (INDEPENDENT_AMBULATORY_CARE_PROVIDER_SITE_OTHER): Payer: BC Managed Care – PPO | Admitting: Bariatrics

## 2021-06-14 ENCOUNTER — Other Ambulatory Visit: Payer: Self-pay

## 2021-06-14 ENCOUNTER — Ambulatory Visit (INDEPENDENT_AMBULATORY_CARE_PROVIDER_SITE_OTHER): Payer: 59 | Admitting: Bariatrics

## 2021-06-14 ENCOUNTER — Encounter (INDEPENDENT_AMBULATORY_CARE_PROVIDER_SITE_OTHER): Payer: Self-pay | Admitting: Bariatrics

## 2021-06-14 VITALS — BP 120/80 | HR 86 | Temp 97.6°F | Ht 65.0 in | Wt 229.0 lb

## 2021-06-14 DIAGNOSIS — R632 Polyphagia: Secondary | ICD-10-CM | POA: Diagnosis not present

## 2021-06-14 DIAGNOSIS — Z6841 Body Mass Index (BMI) 40.0 and over, adult: Secondary | ICD-10-CM

## 2021-06-14 DIAGNOSIS — E038 Other specified hypothyroidism: Secondary | ICD-10-CM

## 2021-06-14 DIAGNOSIS — Z9189 Other specified personal risk factors, not elsewhere classified: Secondary | ICD-10-CM | POA: Diagnosis not present

## 2021-06-14 DIAGNOSIS — E8881 Metabolic syndrome: Secondary | ICD-10-CM

## 2021-06-14 MED ORDER — METFORMIN HCL 500 MG PO TABS
500.0000 mg | ORAL_TABLET | Freq: Every day | ORAL | 0 refills | Status: DC
Start: 2021-06-14 — End: 2021-07-11

## 2021-06-14 NOTE — Progress Notes (Signed)
Metformin 

## 2021-06-18 NOTE — Progress Notes (Signed)
Chief Complaint:   OBESITY Leah Schneider is here to discuss her progress with her obesity treatment plan along with follow-up of her obesity related diagnoses. Leah Schneider is on the Category 2 Plan and states she is following her eating plan approximately 95% of the time. Leah Schneider states she is walking 2 miles 6 times per week.  Today's visit was #: 4 Starting weight: 241 lbs Starting date: 05/08/2021 Today's weight: 229 lbs Today's date: 06/14/2021 Total lbs lost to date: 12 lbs Total lbs lost since last in-office visit: 3 lbs  Interim History: Leah Schneider is down an additional 3 lbs and doing well overall. She has been doing well with exercise. She is doing well with her water.  Subjective:   1. Insulin resistance Leah Schneider is currently not on medication.  2. Other specified hypothyroidism Leah Schneider is currently taking Synthroid.  3. Polyphagia Leah Schneider endorses excessive hunger especially with period. IUD.  4. At risk for side effect of medication Leah Schneider is at risk for side effect of medication due to nausea and vomiting.  Assessment/Plan:   1. Insulin resistance Leah Schneider will continue to be active. She will continue to work on weight loss, exercise, and decreasing simple carbohydrates to help decrease the risk of diabetes. Leah Schneider agreed to follow-up with [place] as directed to closely monitor her progress.   2. Other specified hypothyroidism Leah Schneider will continue taking Synthroid. Orders and follow up as documented in patient record.  Counseling Good thyroid control is important for overall health. Supratherapeutic thyroid levels are dangerous and will not improve weight loss results. Counseling: The correct way to take levothyroxine is fasting, with water, separated by at least 30 minutes from breakfast, and separated by more than 4 hours from calcium, iron, multivitamins, acid reflux medications (PPIs).    3. Polyphagia Intensive lifestyle modifications are the first line treatment for  this issue. We discussed several lifestyle modifications today and she will continue to work on diet, exercise and weight loss efforts. We will refill Metformin 500 mg daily for 1 month with no refills. Orders and follow up as documented in patient record.  Counseling Polyphagia is excessive hunger. Causes can include: low blood sugars, hypERthyroidism, PMS, lack of sleep, stress, insulin resistance, diabetes, certain medications, and diets that are deficient in protein and fiber.    - metFORMIN (GLUCOPHAGE) 500 MG tablet; Take 1 tablet (500 mg total) by mouth daily after lunch.  Dispense: 30 tablet; Refill: 0  4. At risk for side effect of medication Leah Schneider was given approximately 15 minutes of drug side effect counseling today.  We discussed side effect possibility and risk versus benefits. Leah Schneider agreed to the medication and will contact this office if these side effects are intolerable.  Repetitive spaced learning was employed today to elicit superior memory formation and behavioral change.   5. Obesity, current BMI 38.2 Leah Schneider is currently in the action stage of change. As such, her goal is to continue with weight loss efforts. She has agreed to the Category 2 Plan.   Leah Schneider will continue meal planning. She will continue intentional eating.  Exercise goals:  As is.  Behavioral modification strategies: increasing lean protein intake, decreasing simple carbohydrates, increasing vegetables, increasing water intake, decreasing eating out, no skipping meals, meal planning and cooking strategies, keeping healthy foods in the home, and planning for success.  Rebekah has agreed to follow-up with our clinic in 3 weeks. She was informed of the importance of frequent follow-up visits to maximize her success with intensive lifestyle modifications  for her multiple health conditions.   Objective:   Blood pressure 120/80, pulse 86, temperature 97.6 F (36.4 C), height 5\' 5"  (1.651 m), weight 229  lb (103.9 kg), SpO2 97 %, unknown if currently breastfeeding. Body mass index is 38.11 kg/m.  General: Cooperative, alert, well developed, in no acute distress. HEENT: Conjunctivae and lids unremarkable. Cardiovascular: Regular rhythm.  Lungs: Normal work of breathing. Neurologic: No focal deficits.   Lab Results  Component Value Date   CREATININE 0.68 05/08/2021   BUN 12 05/08/2021   NA 136 05/08/2021   K 4.5 05/08/2021   CL 98 05/08/2021   CO2 22 05/08/2021   Lab Results  Component Value Date   ALT 17 05/08/2021   AST 17 05/08/2021   ALKPHOS 123 (H) 05/08/2021   BILITOT 0.5 05/08/2021   Lab Results  Component Value Date   HGBA1C 5.3 05/08/2021   Lab Results  Component Value Date   INSULIN 21.1 05/08/2021   Lab Results  Component Value Date   TSH 2.060 05/08/2021   Lab Results  Component Value Date   CHOL 174 05/08/2021   HDL 47 05/08/2021   LDLCALC 105 (H) 05/08/2021   TRIG 123 05/08/2021   Lab Results  Component Value Date   VD25OH 60.1 05/08/2021   Lab Results  Component Value Date   WBC 7.2 05/08/2021   HGB 15.1 05/08/2021   HCT 45.9 05/08/2021   MCV 88 05/08/2021   PLT 243 05/08/2021   No results found for: IRON, TIBC, FERRITIN  Attestation Statements:   Reviewed by clinician on day of visit: allergies, medications, problem list, medical history, surgical history, family history, social history, and previous encounter notes.  Time spent on visit including pre-visit chart review and post-visit care and charting was 20 minutes.   I, 07/09/2021, RMA, am acting as [employer] for Energy manager, DO.   I have reviewed the above documentation for accuracy and completeness, and I agree with the above. [employer], DO

## 2021-06-19 ENCOUNTER — Encounter (INDEPENDENT_AMBULATORY_CARE_PROVIDER_SITE_OTHER): Payer: Self-pay | Admitting: Bariatrics

## 2021-07-11 ENCOUNTER — Encounter (INDEPENDENT_AMBULATORY_CARE_PROVIDER_SITE_OTHER): Payer: Self-pay | Admitting: Family Medicine

## 2021-07-11 ENCOUNTER — Other Ambulatory Visit: Payer: Self-pay

## 2021-07-11 ENCOUNTER — Ambulatory Visit (INDEPENDENT_AMBULATORY_CARE_PROVIDER_SITE_OTHER): Payer: 59 | Admitting: Family Medicine

## 2021-07-11 VITALS — BP 97/63 | HR 75 | Temp 97.7°F | Ht 65.0 in | Wt 225.0 lb

## 2021-07-11 DIAGNOSIS — Z6841 Body Mass Index (BMI) 40.0 and over, adult: Secondary | ICD-10-CM | POA: Diagnosis not present

## 2021-07-11 DIAGNOSIS — R632 Polyphagia: Secondary | ICD-10-CM | POA: Insufficient documentation

## 2021-07-11 DIAGNOSIS — E8881 Metabolic syndrome: Secondary | ICD-10-CM

## 2021-07-11 MED ORDER — METFORMIN HCL 500 MG PO TABS: 500.0000 mg | ORAL_TABLET | Freq: Every day | ORAL | 0 refills | Status: DC

## 2021-07-12 NOTE — Progress Notes (Signed)
Chief Complaint:   OBESITY Abbigaile is here to discuss her progress with her obesity treatment plan along with follow-up of her obesity related diagnoses. Veria is on the Category 2 Plan and states she is following her eating plan approximately 90% of the time. [place] states she is walking for 30 minutes 7 times per week.  Today's visit was #: 5 Starting weight: 241 lbs Starting date: 05/08/2021 Today's weight: 225 lbs Today's date: 07/11/2021 Total lbs lost to date: 16 Total lbs lost since last in-office visit: 4  Interim History: Finlee continues to do well with weight loss. Her hunger is still an issue in the afternoons. She is working on following her plan but she may not be eating all of her protein.  Subjective:   1. Insulin resistance Elynor is stable on metformin, and she is taking it at lunch time but she still struggles with polyphagia.  Assessment/Plan:   1. Insulin resistance We will refill metformin 500 mg q daily for 1 month. Rikita is to try to take it in the morning with breakfast and increase protein in the morning. She is to try to eat "real food" versus protein supplementation. We may need to increase metformin to BID at her next visit if no improvement. Jalyne agreed to follow-up with [place] as directed to closely monitor her progress.  - metFORMIN (GLUCOPHAGE) 500 MG tablet; Take 1 tablet (500 mg total) by mouth daily after lunch.  Dispense: 30 tablet; Refill: 0  2. Obesity, current BMI 37.4 Ashle is currently in the action stage of change. As such, her goal is to continue with weight loss efforts. She has agreed to the Category 2 Plan.   Exercise goals: As is.  Behavioral modification strategies: increasing lean protein intake, no skipping meals, and meal planning and cooking strategies.  Laqueta has agreed to follow-up with our clinic in 2 weeks. She was informed of the importance of frequent follow-up visits to maximize her success with intensive  lifestyle modifications for her multiple health conditions.   Objective:   Blood pressure 97/63, pulse 75, temperature 97.7 F (36.5 C), height 5\' 5"  (1.651 m), weight 225 lb (102.1 kg), SpO2 100 %, unknown if currently breastfeeding. Body mass index is 37.44 kg/m.  General: Cooperative, alert, well developed, in no acute distress. HEENT: Conjunctivae and lids unremarkable. Cardiovascular: Regular rhythm.  Lungs: Normal work of breathing. Neurologic: No focal deficits.   Lab Results  Component Value Date   CREATININE 0.68 05/08/2021   BUN 12 05/08/2021   NA 136 05/08/2021   K 4.5 05/08/2021   CL 98 05/08/2021   CO2 22 05/08/2021   Lab Results  Component Value Date   ALT 17 05/08/2021   AST 17 05/08/2021   ALKPHOS 123 (H) 05/08/2021   BILITOT 0.5 05/08/2021   Lab Results  Component Value Date   HGBA1C 5.3 05/08/2021   Lab Results  Component Value Date   INSULIN 21.1 05/08/2021   Lab Results  Component Value Date   TSH 2.060 05/08/2021   Lab Results  Component Value Date   CHOL 174 05/08/2021   HDL 47 05/08/2021   LDLCALC 105 (H) 05/08/2021   TRIG 123 05/08/2021   Lab Results  Component Value Date   VD25OH 60.1 05/08/2021   Lab Results  Component Value Date   WBC 7.2 05/08/2021   HGB 15.1 05/08/2021   HCT 45.9 05/08/2021   MCV 88 05/08/2021   PLT 243 05/08/2021   No results  found for: IRON, TIBC, FERRITIN  Attestation Statements:   Reviewed by clinician on day of visit: allergies, medications, problem list, medical history, surgical history, family history, social history, and previous encounter notes.  Time spent on visit including pre-visit chart review and post-visit care and charting was 42 minutes.    I, Burt Knack, am acting as transcriptionist for Quillian Quince, MD.  I have reviewed the above documentation for accuracy and completeness, and I agree with the above. -  Quillian Quince, MD

## 2021-07-18 ENCOUNTER — Other Ambulatory Visit (INDEPENDENT_AMBULATORY_CARE_PROVIDER_SITE_OTHER): Payer: Self-pay | Admitting: Emergency Medicine

## 2021-07-18 ENCOUNTER — Encounter (INDEPENDENT_AMBULATORY_CARE_PROVIDER_SITE_OTHER): Payer: Self-pay | Admitting: Family Medicine

## 2021-07-18 DIAGNOSIS — E8881 Metabolic syndrome: Secondary | ICD-10-CM

## 2021-07-18 MED ORDER — METFORMIN HCL 500 MG PO TABS: 500.0000 mg | ORAL_TABLET | Freq: Every day | ORAL | 0 refills | Status: DC

## 2021-07-18 NOTE — Telephone Encounter (Signed)
Last OV with Dr. Beasley 

## 2021-07-25 ENCOUNTER — Other Ambulatory Visit: Payer: Self-pay

## 2021-07-25 ENCOUNTER — Ambulatory Visit (INDEPENDENT_AMBULATORY_CARE_PROVIDER_SITE_OTHER): Payer: 59 | Admitting: Family Medicine

## 2021-07-25 ENCOUNTER — Encounter (INDEPENDENT_AMBULATORY_CARE_PROVIDER_SITE_OTHER): Payer: Self-pay | Admitting: Family Medicine

## 2021-07-25 VITALS — BP 109/73 | HR 76 | Temp 97.6°F | Ht 65.0 in | Wt 225.0 lb

## 2021-07-25 DIAGNOSIS — E8881 Metabolic syndrome: Secondary | ICD-10-CM

## 2021-07-25 DIAGNOSIS — Z9189 Other specified personal risk factors, not elsewhere classified: Secondary | ICD-10-CM | POA: Diagnosis not present

## 2021-07-25 DIAGNOSIS — Z6841 Body Mass Index (BMI) 40.0 and over, adult: Secondary | ICD-10-CM

## 2021-07-25 MED ORDER — METFORMIN HCL 500 MG PO TABS: 500.0000 mg | ORAL_TABLET | Freq: Two times a day (BID) | ORAL | 0 refills | Status: DC

## 2021-07-26 NOTE — Progress Notes (Signed)
Chief Complaint:   OBESITY Leah Schneider is here to discuss her progress with her obesity treatment plan along with follow-up of her obesity related diagnoses. [name] is on the Category 2 Plan and states she is following her eating plan approximately 90% of the time. [name] states she is walking and doing crunches/sit-ups 20-30 minutes 5 times per week.  Today's visit was #: 6 Starting weight: 241 lbs Starting date: 05/08/2021 Today's weight: 225 lbs Today's date: 07/25/2021 Total lbs lost to date: 16 Total lbs lost since last in-office visit: 0  Interim History: [name] added crunches and sit-ups since her last OV. She only occasionally skips lunch on weekends. She doesn't weigh her proteins at lunch or dinner. Skips meals.  Subjective:   1. Insulin resistance [name] has increased hunger and cravings in late afternoons. Snacks are fruit and other 100 cal items.  2. At risk for deficient intake of food [name] is at risk for deficient intake of food due to inadequate protein intake.  Assessment/Plan:  No orders of the defined types were placed in this encounter.   Medications Discontinued During This Encounter  Medication Reason   metFORMIN (GLUCOPHAGE) 500 MG tablet Reorder     Meds ordered this encounter  Medications   metFORMIN (GLUCOPHAGE) 500 MG tablet    Sig: Take 1 tablet (500 mg total) by mouth 2 (two) times daily with a meal.    Dispense:  60 tablet    Refill:  0    30 d supply;  ** OV for RF **   Do not send RF request     1. Insulin resistance [name] will increase Metformin to BID and continue to work on weight loss, exercise, and decreasing simple carbohydrates to help decrease the risk of diabetes. [name] agreed to follow-up with [place] as directed to closely monitor her progress. Increase protein and decrease carbs by eating what's on the plan. Education provided regarding risk and benefits of meds and role of meal plan in treating this condition.  Increase  and Refill- metFORMIN (GLUCOPHAGE) 500 MG tablet; Take 1 tablet (500 mg total) by mouth 2 (two) times daily with a meal.  Dispense: 60 tablet; Refill: 0  2. At risk for deficient intake of food [name] was given approximately 9 minutes of deficit intake of food prevention counseling today. [name] is at risk for eating too few calories based on current food recall. She was encouraged to focus on meeting caloric and protein goals according to her recommended meal plan.   3. Obesity with current BMI of 37.4  [name] is currently in the action stage of change. As such, her goal is to continue with weight loss efforts. She has agreed to the Category 2 Plan.   Pt's goals: Weigh and consume proteins for lunch and dinner; Spices and meal prep discussed with pt. Handout: Spices  Exercise goals:  As is  Behavioral modification strategies: no skipping meals, meal planning and cooking strategies, and planning for success.  [name] has agreed to follow-up with our clinic in 2-3 weeks. She was informed of the importance of frequent follow-up visits to maximize her success with intensive lifestyle modifications for her multiple health conditions.   Objective:   Blood pressure 109/73, pulse 76, temperature 97.6 F (36.4 C), height 5\' 5"  (1.651 m), weight 225 lb (102.1 kg), SpO2 99 %, unknown if currently breastfeeding. Body mass index is 37.44 kg/m.  General: Cooperative, alert, well developed, in no acute distress. HEENT: Conjunctivae and lids unremarkable.  Cardiovascular: Regular rhythm.  Lungs: Normal work of breathing. Neurologic: No focal deficits.   Lab Results  Component Value Date   CREATININE 0.68 05/08/2021   BUN 12 05/08/2021   NA 136 05/08/2021   K 4.5 05/08/2021   CL 98 05/08/2021   CO2 22 05/08/2021   Lab Results  Component Value Date   ALT 17 05/08/2021   AST 17 05/08/2021   ALKPHOS 123 (H) 05/08/2021   BILITOT 0.5 05/08/2021   Lab Results  Component Value Date    HGBA1C 5.3 05/08/2021   Lab Results  Component Value Date   INSULIN 21.1 05/08/2021   Lab Results  Component Value Date   TSH 2.060 05/08/2021   Lab Results  Component Value Date   CHOL 174 05/08/2021   HDL 47 05/08/2021   LDLCALC 105 (H) 05/08/2021   TRIG 123 05/08/2021   Lab Results  Component Value Date   VD25OH 60.1 05/08/2021   Lab Results  Component Value Date   WBC 7.2 05/08/2021   HGB 15.1 05/08/2021   HCT 45.9 05/08/2021   MCV 88 05/08/2021   PLT 243 05/08/2021    Attestation Statements:   Reviewed by clinician on day of visit: allergies, medications, problem list, medical history, surgical history, family history, social history, and previous encounter notes.  Edmund Hilda, CMA, am acting as transcriptionist for [employer], DO.  I have reviewed the above documentation for accuracy and completeness, and I agree with the above. Carlye Grippe, D.O.  The 21st Century Cures Act was signed into law in 2016 which includes the topic of electronic health records.  This provides immediate access to information in MyChart.  This includes consultation notes, operative notes, office notes, lab results and pathology reports.  If you have any questions about what you read please let us know at your next visit so we can discuss your concerns and take corrective action if need be.  We are right here with you.

## 2021-07-30 ENCOUNTER — Ambulatory Visit (INDEPENDENT_AMBULATORY_CARE_PROVIDER_SITE_OTHER): Payer: 59 | Admitting: Family Medicine

## 2021-08-06 ENCOUNTER — Encounter (INDEPENDENT_AMBULATORY_CARE_PROVIDER_SITE_OTHER): Payer: Self-pay

## 2021-08-08 ENCOUNTER — Other Ambulatory Visit: Payer: Self-pay

## 2021-08-08 ENCOUNTER — Encounter (INDEPENDENT_AMBULATORY_CARE_PROVIDER_SITE_OTHER): Payer: Self-pay | Admitting: Family Medicine

## 2021-08-08 ENCOUNTER — Ambulatory Visit (INDEPENDENT_AMBULATORY_CARE_PROVIDER_SITE_OTHER): Payer: 59 | Admitting: Family Medicine

## 2021-08-08 VITALS — BP 117/78 | HR 78 | Temp 98.1°F | Ht 65.0 in | Wt 221.0 lb

## 2021-08-08 DIAGNOSIS — Z6841 Body Mass Index (BMI) 40.0 and over, adult: Secondary | ICD-10-CM | POA: Diagnosis not present

## 2021-08-08 DIAGNOSIS — E8881 Metabolic syndrome: Secondary | ICD-10-CM | POA: Diagnosis not present

## 2021-08-09 NOTE — Progress Notes (Signed)
Chief Complaint:   OBESITY Leah Schneider is here to discuss her progress with her obesity treatment plan along with follow-up of her obesity related diagnoses. Leah Schneider is on the Category 2 Plan and states she is following her eating plan approximately 90% of the time. Leah Schneider states she is walking and doing crunches for 15-20 minutes 5 times per week.  Today's visit was #: 7 Starting weight: 241 lbs Starting date: 05/08/2021 Today's weight: 221 lbs Today's date: 08/08/2021 Total lbs lost to date: 20 Total lbs lost since last in-office visit: 4  Interim History: Leah Schneider continues to do very well with weight loss on her plan. She notes increased hunger in the afternoons, but she has to eat lunch early which contributes to her early hunger.  Subjective:   1. Insulin resistance Leah Schneider is doing well on metformin. She has questions about how metformin works and if she will need to be on it long term.  Assessment/Plan:   1. Insulin resistance Leah Schneider is to continue metformin BID, and 100 calorie protein snacks were discussed. We discussed the safety of metformin and the potential of long term use versus short term use. Leah Schneider agreed to follow-up with [place] as directed to closely monitor her progress.  2. Obesity with current BMI of 36.9 Leah Schneider is currently in the action stage of change. As such, her goal is to continue with weight loss efforts. She has agreed to the Category 2 Plan.   Leah Schneider is to plan on a snack in the mid-afternoon to help stave off excessive dinner hunger.  Exercise goals: As is.  Behavioral modification strategies: increasing lean protein intake and better snacking choices.  Leah Schneider has agreed to follow-up with our clinic in 2 to 3 weeks. She was informed of the importance of frequent follow-up visits to maximize her success with intensive lifestyle modifications for her multiple health conditions.   Objective:   Blood pressure 117/78, pulse 78, temperature 98.1  F (36.7 C), height 5\' 5"  (1.651 m), weight 221 lb (100.2 kg), SpO2 98 %, unknown if currently breastfeeding. Body mass index is 36.78 kg/m.  General: Cooperative, alert, well developed, in no acute distress. HEENT: Conjunctivae and lids unremarkable. Cardiovascular: Regular rhythm.  Lungs: Normal work of breathing. Neurologic: No focal deficits.   Lab Results  Component Value Date   CREATININE 0.68 05/08/2021   BUN 12 05/08/2021   NA 136 05/08/2021   K 4.5 05/08/2021   CL 98 05/08/2021   CO2 22 05/08/2021   Lab Results  Component Value Date   ALT 17 05/08/2021   AST 17 05/08/2021   ALKPHOS 123 (H) 05/08/2021   BILITOT 0.5 05/08/2021   Lab Results  Component Value Date   HGBA1C 5.3 05/08/2021   Lab Results  Component Value Date   INSULIN 21.1 05/08/2021   Lab Results  Component Value Date   TSH 2.060 05/08/2021   Lab Results  Component Value Date   CHOL 174 05/08/2021   HDL 47 05/08/2021   LDLCALC 105 (H) 05/08/2021   TRIG 123 05/08/2021   Lab Results  Component Value Date   VD25OH 60.1 05/08/2021   Lab Results  Component Value Date   WBC 7.2 05/08/2021   HGB 15.1 05/08/2021   HCT 45.9 05/08/2021   MCV 88 05/08/2021   PLT 243 05/08/2021   No results found for: IRON, TIBC, FERRITIN  Attestation Statements:   Reviewed by clinician on day of visit: allergies, medications, problem list, medical history, surgical history, family  history, social history, and previous encounter notes.  Time spent on visit including pre-visit chart review and post-visit care and charting was 30 minutes.    I, Trixie Dredge, am acting as transcriptionist for Dennard Nip, MD.  I have reviewed the above documentation for accuracy and completeness, and I agree with the above. -  Dennard Nip, MD

## 2021-08-29 ENCOUNTER — Encounter (INDEPENDENT_AMBULATORY_CARE_PROVIDER_SITE_OTHER): Payer: Self-pay | Admitting: Family Medicine

## 2021-08-29 ENCOUNTER — Other Ambulatory Visit: Payer: Self-pay

## 2021-08-29 ENCOUNTER — Ambulatory Visit (INDEPENDENT_AMBULATORY_CARE_PROVIDER_SITE_OTHER): Payer: 59 | Admitting: Family Medicine

## 2021-08-29 VITALS — BP 106/67 | HR 66 | Temp 97.9°F | Ht 65.0 in | Wt 218.0 lb

## 2021-08-29 DIAGNOSIS — E038 Other specified hypothyroidism: Secondary | ICD-10-CM

## 2021-08-29 DIAGNOSIS — Z6841 Body Mass Index (BMI) 40.0 and over, adult: Secondary | ICD-10-CM | POA: Diagnosis not present

## 2021-08-29 DIAGNOSIS — Z9189 Other specified personal risk factors, not elsewhere classified: Secondary | ICD-10-CM

## 2021-08-29 DIAGNOSIS — E8881 Metabolic syndrome: Secondary | ICD-10-CM

## 2021-08-29 MED ORDER — LEVOTHYROXINE SODIUM 50 MCG PO TABS: 50.0000 ug | ORAL_TABLET | Freq: Every day | ORAL | 0 refills | Status: DC

## 2021-08-30 NOTE — Progress Notes (Signed)
Chief Complaint:   OBESITY Leah Schneider is here to discuss her progress with her obesity treatment plan along with follow-up of her obesity related diagnoses. Leah Schneider is on the Category 2 Plan and states she is following her eating plan approximately 85% of the time. Leah Schneider states she is doing home exercises for 30 minutes 3 times per week.  Today's visit was #: 8 Starting weight: 241 lbs Starting date: 05/08/2021 Today's weight: 218 lbs Today's date: 08/29/2021 Total lbs lost to date: 23 Total lbs lost since last in-office visit: 3  Interim History: Leah Schneider continues to do well with weight loss. She is working on increasing protein and exercise, but her job is very stressful and she doesn't always have the time to eat all of her food.  Subjective:   1. Other specified hypothyroidism Leah Schneider is stable on levothyroxine. No tremors or palpitations were mentioned.  2. Insulin resistance Leah Schneider si doing well remembering to take both doses of metformin. She is doing well with diet, exercise, and weight loss.  3. At risk for impaired metabolic function Leah Schneider is at increased risk for impaired metabolic function if calories or protein drops too low.  Assessment/Plan:   1. Other specified hypothyroidism Leah Schneider will continue levothyroxine, and we will refill for 1 month. We will recheck labs in 1-2 months. Orders and follow up as documented in patient record.  - levothyroxine (SYNTHROID) 50 MCG tablet; Take 1 tablet (50 mcg total) by mouth daily before breakfast.  Dispense: 30 tablet; Refill: 0  2. Insulin resistance Leah Schneider will continue metformin, and will continue to work on weight loss, exercise, and decreasing simple carbohydrates to help decrease the risk of diabetes. We will recheck labs in 1-2 months. Leah Schneider agreed to follow-up with [place] as directed to closely monitor her progress.  3. At risk for impaired metabolic function Leah Schneider was given approximately 15 minutes of  impaired  metabolic function prevention counseling today. We discussed intensive lifestyle modifications today with an emphasis on specific nutrition and exercise instructions and strategies.   Repetitive spaced learning was employed today to elicit superior memory formation and behavioral change.  4. Obesity with current BMI of 36.4 Leah Schneider is currently in the action stage of change. As such, her goal is to continue with weight loss efforts. She has agreed to the Category 2 Plan.   Strategies to help minimize skipping meals were discussed.  Exercise goals: As is.  Behavioral modification strategies: no skipping meals, meal planning and cooking strategies, holiday eating strategies , and planning for success.  Leah Schneider has agreed to follow-up with our clinic in 4 weeks. She was informed of the importance of frequent follow-up visits to maximize her success with intensive lifestyle modifications for her multiple health conditions.   Objective:   Blood pressure 106/67, pulse 66, temperature 97.9 F (36.6 C), height 5\' 5"  (1.651 m), weight 218 lb (98.9 kg), SpO2 98 %, unknown if currently breastfeeding. Body mass index is 36.28 kg/m.  General: Cooperative, alert, well developed, in no acute distress. HEENT: Conjunctivae and lids unremarkable. Cardiovascular: Regular rhythm.  Lungs: Normal work of breathing. Neurologic: No focal deficits.   Lab Results  Component Value Date   CREATININE 0.68 05/08/2021   BUN 12 05/08/2021   NA 136 05/08/2021   K 4.5 05/08/2021   CL 98 05/08/2021   CO2 22 05/08/2021   Lab Results  Component Value Date   ALT 17 05/08/2021   AST 17 05/08/2021   ALKPHOS 123 (H) 05/08/2021  BILITOT 0.5 05/08/2021   Lab Results  Component Value Date   HGBA1C 5.3 05/08/2021   Lab Results  Component Value Date   INSULIN 21.1 05/08/2021   Lab Results  Component Value Date   TSH 2.060 05/08/2021   Lab Results  Component Value Date   CHOL 174 05/08/2021    HDL 47 05/08/2021   LDLCALC 105 (H) 05/08/2021   TRIG 123 05/08/2021   Lab Results  Component Value Date   VD25OH 60.1 05/08/2021   Lab Results  Component Value Date   WBC 7.2 05/08/2021   HGB 15.1 05/08/2021   HCT 45.9 05/08/2021   MCV 88 05/08/2021   PLT 243 05/08/2021   No results found for: IRON, TIBC, FERRITIN  Attestation Statements:   Reviewed by clinician on day of visit: allergies, medications, problem list, medical history, surgical history, family history, social history, and previous encounter notes.   I, Burt Knack, am acting as transcriptionist for Quillian Quince, MD.  I have reviewed the above documentation for accuracy and completeness, and I agree with the above. -  Quillian Quince, MD

## 2021-09-04 ENCOUNTER — Encounter (INDEPENDENT_AMBULATORY_CARE_PROVIDER_SITE_OTHER): Payer: Self-pay | Admitting: Family Medicine

## 2021-09-04 ENCOUNTER — Other Ambulatory Visit (INDEPENDENT_AMBULATORY_CARE_PROVIDER_SITE_OTHER): Payer: Self-pay | Admitting: Family Medicine

## 2021-09-04 DIAGNOSIS — E8881 Metabolic syndrome: Secondary | ICD-10-CM

## 2021-09-10 NOTE — Telephone Encounter (Signed)
Pt last seen by Dr. Beasley.  

## 2021-09-22 ENCOUNTER — Other Ambulatory Visit (INDEPENDENT_AMBULATORY_CARE_PROVIDER_SITE_OTHER): Payer: Self-pay | Admitting: Family Medicine

## 2021-09-22 DIAGNOSIS — E8881 Metabolic syndrome: Secondary | ICD-10-CM

## 2021-09-24 NOTE — Telephone Encounter (Signed)
Pt last seen by Dr. Beasley.  

## 2021-09-24 NOTE — Telephone Encounter (Signed)
LAST APPOINTMENT DATE: 08/29/21 NEXT APPOINTMENT DATE: 09/27/21   [facility] [address], [place] - [address] [place] [address] Phone: [phone] Fax: [phone]  [facility] [place], [place] - [address] [place] [address] Phone: [phone] Fax: [phone]  Patient is requesting a refill of the following medications: Requested Prescriptions   Pending Prescriptions Disp Refills   metFORMIN (GLUCOPHAGE) 500 MG tablet [Pharmacy Med Name: metFORMIN HCl 500 MG Oral Tablet] 60 tablet 0    Sig: TAKE 1 TABLET BY MOUTH TWICE DAILY WITH A MEAL VISIT  NEEDED  FOR  REFILLS    Date last filled: 07/25/21 Previously prescribed by Dr. Dalbert Garnet  Lab Results  Component Value Date   HGBA1C 5.3 05/08/2021   Lab Results  Component Value Date   LDLCALC 105 (H) 05/08/2021   CREATININE 0.68 05/08/2021   Lab Results  Component Value Date   VD25OH 60.1 05/08/2021    BP Readings from Last 3 Encounters:  08/29/21 106/67  08/08/21 117/78  07/25/21 109/73

## 2021-09-27 ENCOUNTER — Other Ambulatory Visit: Payer: Self-pay

## 2021-09-27 ENCOUNTER — Encounter (INDEPENDENT_AMBULATORY_CARE_PROVIDER_SITE_OTHER): Payer: Self-pay | Admitting: Family Medicine

## 2021-09-27 ENCOUNTER — Ambulatory Visit (INDEPENDENT_AMBULATORY_CARE_PROVIDER_SITE_OTHER): Payer: 59 | Admitting: Family Medicine

## 2021-09-27 VITALS — BP 123/76 | HR 75 | Temp 98.0°F | Ht 65.0 in | Wt 218.0 lb

## 2021-09-27 DIAGNOSIS — E038 Other specified hypothyroidism: Secondary | ICD-10-CM | POA: Diagnosis not present

## 2021-09-27 DIAGNOSIS — Z6841 Body Mass Index (BMI) 40.0 and over, adult: Secondary | ICD-10-CM

## 2021-09-27 DIAGNOSIS — Z9189 Other specified personal risk factors, not elsewhere classified: Secondary | ICD-10-CM | POA: Diagnosis not present

## 2021-09-27 DIAGNOSIS — E8881 Metabolic syndrome: Secondary | ICD-10-CM | POA: Diagnosis not present

## 2021-09-27 MED ORDER — LEVOTHYROXINE SODIUM 50 MCG PO TABS: 50.0000 ug | ORAL_TABLET | Freq: Every day | ORAL | 0 refills | Status: DC

## 2021-09-27 MED ORDER — METFORMIN HCL 500 MG PO TABS: ORAL_TABLET | ORAL | 0 refills | Status: DC

## 2021-10-01 NOTE — Progress Notes (Signed)
Chief Complaint:   OBESITY Leah Schneider is here to discuss her progress with her obesity treatment plan along with follow-up of her obesity related diagnoses. Leah Schneider is on the Category 2 Plan and states she is following her eating plan approximately (unknown)% of the time. Leah Schneider states she is walking and doing crunches for 30 minutes 5 times per week.  Today's visit was #: 9 Starting weight: 241 lbs Starting date: 05/08/2021 Today's weight: 218 lbs Today's date: 09/27/2021 Total lbs lost to date: 23 Total lbs lost since last in-office visit: 0  Interim History: Leah Schneider has done well with avoiding weight gain over Thanksgiving. She did well with minimizing leftovers and she has started to get back on track.  Subjective:   1. Insulin resistance Leah Schneider is working on diet and exercise, ans she is due for labs soon.  2. Other specified hypothyroidism Leah Schneider is stable on levothyroxine, and she denies tremors or palpitations. She is due to have labs done soon.  3. At risk for impaired function of heart Leah Schneider is at increased risk for impaired metabolic function if protein decreases.  Assessment/Plan:   1. Insulin resistance Leah Schneider will continue to work on weight loss, exercise, and decreasing simple carbohydrates to help decrease the risk of diabetes. We will refill metformin for 1 month, and we will recheck labs in 3 weeks. Leah Schneider agreed to follow-up with [place] as directed to closely monitor her progress.  - metFORMIN (GLUCOPHAGE) 500 MG tablet; TAKE 1 TABLET BY MOUTH TWICE DAILY WITH A MEAL VISIT  NEEDED  FOR  REFILLS Strength: 500 mg  Dispense: 60 tablet; Refill: 0  2. Other specified hypothyroidism We will refill levothyroxine for 1 month. We will recheck labs at Leah Schneider's next visit in 3 weeks. Orders and follow up as documented in patient record.  - levothyroxine (SYNTHROID) 50 MCG tablet; Take 1 tablet (50 mcg total) by mouth daily before breakfast.  Dispense: 30 tablet;  Refill: 0  3. At risk for impaired function of heart Leah Schneider was given approximately 15 minutes of impaired  metabolic function prevention counseling today. We discussed intensive lifestyle modifications today with an emphasis on specific nutrition and exercise instructions and strategies.   Repetitive spaced learning was employed today to elicit superior memory formation and behavioral change.  4. Obesity with current BMI of 36.4 Leah Schneider is currently in the action stage of change. As such, her goal is to continue with weight loss efforts. She has agreed to the Category 2 Plan.   We will recheck fasting labs at her next visit.  Exercise goals: As is.  Behavioral modification strategies: increasing lean protein intake, meal planning and cooking strategies, and holiday eating strategies .  Leah Schneider has agreed to follow-up with our clinic in 3 weeks. She was informed of the importance of frequent follow-up visits to maximize her success with intensive lifestyle modifications for her multiple health conditions.   Objective:   Blood pressure 123/76, pulse 75, temperature 98 F (36.7 C), height 5\' 5"  (1.651 m), weight 218 lb (98.9 kg), SpO2 100 %, unknown if currently breastfeeding. Body mass index is 36.28 kg/m.  General: Cooperative, alert, well developed, in no acute distress. HEENT: Conjunctivae and lids unremarkable. Cardiovascular: Regular rhythm.  Lungs: Normal work of breathing. Neurologic: No focal deficits.   Lab Results  Component Value Date   CREATININE 0.68 05/08/2021   BUN 12 05/08/2021   NA 136 05/08/2021   K 4.5 05/08/2021   CL 98 05/08/2021   CO2 22  05/08/2021   Lab Results  Component Value Date   ALT 17 05/08/2021   AST 17 05/08/2021   ALKPHOS 123 (H) 05/08/2021   BILITOT 0.5 05/08/2021   Lab Results  Component Value Date   HGBA1C 5.3 05/08/2021   Lab Results  Component Value Date   INSULIN 21.1 05/08/2021   Lab Results  Component Value Date   TSH  2.060 05/08/2021   Lab Results  Component Value Date   CHOL 174 05/08/2021   HDL 47 05/08/2021   LDLCALC 105 (H) 05/08/2021   TRIG 123 05/08/2021   Lab Results  Component Value Date   VD25OH 60.1 05/08/2021   Lab Results  Component Value Date   WBC 7.2 05/08/2021   HGB 15.1 05/08/2021   HCT 45.9 05/08/2021   MCV 88 05/08/2021   PLT 243 05/08/2021   No results found for: IRON, TIBC, FERRITIN  Attestation Statements:   Reviewed by clinician on day of visit: allergies, medications, problem list, medical history, surgical history, family history, social history, and previous encounter notes.   I, Burt Knack, am acting as transcriptionist for Quillian Quince, MD.  I have reviewed the above documentation for accuracy and completeness, and I agree with the above. -  Quillian Quince, MD

## 2021-10-18 ENCOUNTER — Ambulatory Visit (INDEPENDENT_AMBULATORY_CARE_PROVIDER_SITE_OTHER): Payer: 59 | Admitting: Family Medicine

## 2021-10-18 ENCOUNTER — Other Ambulatory Visit: Payer: Self-pay

## 2021-10-18 ENCOUNTER — Encounter (INDEPENDENT_AMBULATORY_CARE_PROVIDER_SITE_OTHER): Payer: Self-pay | Admitting: Family Medicine

## 2021-10-18 VITALS — BP 111/76 | HR 71 | Temp 98.0°F | Ht 65.0 in | Wt 211.0 lb

## 2021-10-18 DIAGNOSIS — E7849 Other hyperlipidemia: Secondary | ICD-10-CM

## 2021-10-18 DIAGNOSIS — E8881 Metabolic syndrome: Secondary | ICD-10-CM | POA: Diagnosis not present

## 2021-10-18 DIAGNOSIS — Z6841 Body Mass Index (BMI) 40.0 and over, adult: Secondary | ICD-10-CM

## 2021-10-18 DIAGNOSIS — E559 Vitamin D deficiency, unspecified: Secondary | ICD-10-CM | POA: Diagnosis not present

## 2021-10-18 DIAGNOSIS — Z9189 Other specified personal risk factors, not elsewhere classified: Secondary | ICD-10-CM | POA: Diagnosis not present

## 2021-10-18 MED ORDER — METFORMIN HCL 500 MG PO TABS: ORAL_TABLET | ORAL | 0 refills | Status: DC

## 2021-10-18 NOTE — Progress Notes (Signed)
Chief Complaint:   OBESITY Leah Schneider is here to discuss her progress with her obesity treatment plan along with follow-up of her obesity related diagnoses. [name] is on the Category 2 Plan and states she is following her eating plan approximately 95% of the time. [name] states she is walking 1 mile, and doing crunches 3-5 times per week.   Today's visit was #: 10 Starting weight: 241 lbs Starting date: 05/08/2021 Today's weight: 211 lbs Today's date: 10/18/2021 Total lbs lost to date: 30 Total lbs lost since last in-office visit: 7  Interim History: Leah Schneider continues to do well with weight loss even while off of her routine and with extra holiday temptations around. Her hunger is controlled but her protein intake has started to decrease. She would like some easy high protein recipes for dinner.  Subjective:   1. Insulin resistance [name] is working on diet and weight loss, and she is doing very well. She notes decreased polyphagia, and she is tolerating metformin well.  2. Other hyperlipidemia Leah Schneider is working on diet and exercise, and last LDL was mildly elevated.  3. Vitamin D deficiency Leah Schneider is on Vit D, and she is due to have labs checked.  4. At risk for impaired metabolic function Leah Schneider is at increased risk for impaired metabolic function if protein decreases.  Assessment/Plan:   1. Insulin resistance We will check labs today, and we will refill metformin for 1 month. [name] will continue to work on weight loss, exercise, and decreasing simple carbohydrates to help decrease the risk of diabetes. [name] agreed to follow-up with [place] as directed to closely monitor her progress.  - Hemoglobin A1c - Insulin, random - CMP14+EGFR - metFORMIN (GLUCOPHAGE) 500 MG tablet; TAKE 1 TABLET BY MOUTH TWICE DAILY WITH A MEAL VISIT  NEEDED  FOR  REFILLS Strength: 500 mg  Dispense: 60 tablet; Refill: 0  2. Other hyperlipidemia Cardiovascular risk and specific lipid/LDL  goals reviewed. We discussed several lifestyle modifications today. We will check labs today. [name] will continue with diet, exercise and weight loss efforts. Orders and follow up as documented in patient record.   Counseling Intensive lifestyle modifications are the first line treatment for this issue. Dietary changes: Increase soluble fiber. Decrease simple carbohydrates. Exercise changes: Moderate to vigorous-intensity aerobic activity 150 minutes per week if tolerated. Lipid-lowering medications: see documented in medical record.  - Lipid Panel With LDL/HDL Ratio  3. Vitamin D deficiency Low Vitamin D level contributes to fatigue and are associated with obesity, breast, and colon cancer. We will check labs today, and Leah Schneider will follow-up for routine testing of Vitamin D, at least 2-3 times per year to avoid over-replacement.  - VITAMIN D 25 Hydroxy (Vit-D Deficiency, Fractures)  4. At risk for impaired metabolic function [name] was given approximately 15 minutes of impaired  metabolic function prevention counseling today. We discussed intensive lifestyle modifications today with an emphasis on specific nutrition and exercise instructions and strategies.   Repetitive spaced learning was employed today to elicit superior memory formation and behavioral change.  5. Obesity with current BMI of 35.2 Leah Schneider is currently in the action stage of change. As such, her goal is to continue with weight loss efforts. She has agreed to the Category 2 Plan and keeping a food journal and adhering to recommended goals of 400-550 calories and 40+ grams of protein at supper daily.   High protein and vegetable soup recipes were given.  Exercise goals: As is.  Behavioral modification strategies: increasing lean  protein intake and meal planning and cooking strategies.  Leah Schneider has agreed to follow-up with our clinic in 4 weeks. She was informed of the importance of frequent follow-up visits to  maximize her success with intensive lifestyle modifications for her multiple health conditions.   Leah Schneider was informed we would discuss her lab results at her next visit unless there is a critical issue that needs to be addressed sooner. Leah Schneider agreed to keep her next visit at the agreed upon time to discuss these results.  Objective:   Blood pressure 111/76, pulse 71, temperature 98 F (36.7 C), height _0  (1.651 m), weight 211 lb (95.7 kg), SpO2 98 %, unknown if currently breastfeeding. Body mass index is 35.11 kg/m.  General: Cooperative, alert, well developed, in no acute distress. HEENT: Conjunctivae and lids unremarkable. Cardiovascular: Regular rhythm.  Lungs: Normal work of breathing. Neurologic: No focal deficits.   Lab Results  Component Value Date   CREATININE 0.68 05/08/2021   BUN 12 05/08/2021   NA 136 05/08/2021   K 4.5 05/08/2021   CL 98 05/08/2021   CO2 22 05/08/2021   Lab Results  Component Value Date   ALT 17 05/08/2021   AST 17 05/08/2021   ALKPHOS 123 (H) 05/08/2021   BILITOT 0.5 05/08/2021   Lab Results  Component Value Date   HGBA1C 5.3 05/08/2021   Lab Results  Component Value Date   INSULIN 21.1 05/08/2021   Lab Results  Component Value Date   TSH 2.060 05/08/2021   Lab Results  Component Value Date   CHOL 174 05/08/2021   HDL 47 05/08/2021   LDLCALC 105 (H) 05/08/2021   TRIG 123 05/08/2021   Lab Results  Component Value Date   VD25OH 60.1 05/08/2021   Lab Results  Component Value Date   WBC 7.2 05/08/2021   HGB 15.1 05/08/2021   HCT 45.9 05/08/2021   MCV 88 05/08/2021   PLT 243 05/08/2021   No results found for: IRON, TIBC, FERRITIN  Attestation Statements:   Reviewed by clinician on day of visit: allergies, medications, problem list, medical history, surgical history, family history, social history, and previous encounter notes.   I, Trixie Dredge, am acting as transcriptionist for Dennard Nip, MD.  I have  reviewed the above documentation for accuracy and completeness, and I agree with the above. -  Dennard Nip, MD

## 2021-10-19 LAB — CMP14+EGFR
ALT: 12 IU/L (ref 0–32)
AST: 16 IU/L (ref 0–40)
Albumin/Globulin Ratio: 1.8 (ref 1.2–2.2)
Albumin: 4.3 g/dL (ref 3.8–4.8)
Alkaline Phosphatase: 101 IU/L (ref 44–121)
BUN/Creatinine Ratio: 20 (ref 9–23)
BUN: 13 mg/dL (ref 6–20)
Bilirubin Total: 0.4 mg/dL (ref 0.0–1.2)
CO2: 24 mmol/L (ref 20–29)
Calcium: 9.2 mg/dL (ref 8.7–10.2)
Chloride: 103 mmol/L (ref 96–106)
Creatinine, Ser: 0.66 mg/dL (ref 0.57–1.00)
Globulin, Total: 2.4 g/dL (ref 1.5–4.5)
Glucose: 78 mg/dL (ref 70–99)
Potassium: 4.2 mmol/L (ref 3.5–5.2)
Sodium: 140 mmol/L (ref 134–144)
Total Protein: 6.7 g/dL (ref 6.0–8.5)
eGFR: 116 mL/min/{1.73_m2} (ref >59)

## 2021-10-19 LAB — HEMOGLOBIN A1C
Est. average glucose Bld gHb Est-mCnc: 100 mg/dL
Hgb A1c MFr Bld: 5.1 % (ref 4.8–5.6)

## 2021-10-19 LAB — LIPID PANEL WITH LDL/HDL RATIO
Cholesterol, Total: 131 mg/dL (ref 100–199)
HDL: 41 mg/dL (ref >39)
LDL Chol Calc (NIH): 74 mg/dL (ref 0–99)
LDL/HDL Ratio: 1.8 ratio (ref 0.0–3.2)
Triglycerides: 79 mg/dL (ref 0–149)
VLDL Cholesterol Cal: 16 mg/dL (ref 5–40)

## 2021-10-19 LAB — VITAMIN D 25 HYDROXY (VIT D DEFICIENCY, FRACTURES): Vit D, 25-Hydroxy: 47.1 ng/mL (ref 30.0–100.0)

## 2021-10-19 LAB — INSULIN, RANDOM: INSULIN: 6.8 u[IU]/mL (ref 2.6–24.9)

## 2021-10-23 ENCOUNTER — Other Ambulatory Visit (INDEPENDENT_AMBULATORY_CARE_PROVIDER_SITE_OTHER): Payer: Self-pay | Admitting: Family Medicine

## 2021-10-23 DIAGNOSIS — E8881 Metabolic syndrome: Secondary | ICD-10-CM

## 2021-10-26 ENCOUNTER — Other Ambulatory Visit (INDEPENDENT_AMBULATORY_CARE_PROVIDER_SITE_OTHER): Payer: Self-pay | Admitting: Family Medicine

## 2021-10-26 DIAGNOSIS — E8881 Metabolic syndrome: Secondary | ICD-10-CM

## 2021-10-30 ENCOUNTER — Encounter (INDEPENDENT_AMBULATORY_CARE_PROVIDER_SITE_OTHER): Payer: Self-pay | Admitting: Family Medicine

## 2021-10-30 DIAGNOSIS — E8881 Metabolic syndrome: Secondary | ICD-10-CM

## 2021-10-30 NOTE — Telephone Encounter (Signed)
Please see message and advise.  Thank you. ° °

## 2021-10-30 NOTE — Telephone Encounter (Signed)
Duplicate

## 2021-10-30 NOTE — Telephone Encounter (Signed)
Dr.Beasley 

## 2021-10-30 NOTE — Telephone Encounter (Signed)
Which medication is she needing refilled?

## 2021-10-30 NOTE — Telephone Encounter (Signed)
LAST APPOINTMENT DATE: 10/18/21 NEXT APPOINTMENT DATE: 11/19/21   [facility] [address], [place] - [address] [place] [address] Phone: [phone] Fax: [phone]  [facility] [place], [place] - [address] [place] [place] [address] Phone: [phone] Fax: [phone]  Patient is requesting a refill of the following medications: Requested Prescriptions   Pending Prescriptions Disp Refills   metFORMIN (GLUCOPHAGE) 500 MG tablet 60 tablet 0    Sig: TAKE 1 TABLET BY MOUTH TWICE DAILY WITH A MEAL VISIT  NEEDED  FOR  REFILLS Strength: 500 mg    Date last filled: 10/18/21 Previously prescribed by Dr. Dalbert Garnet  Lab Results  Component Value Date   HGBA1C 5.1 10/18/2021   HGBA1C 5.3 05/08/2021   Lab Results  Component Value Date   LDLCALC 74 10/18/2021   CREATININE 0.66 10/18/2021   Lab Results  Component Value Date   VD25OH 47.1 10/18/2021   VD25OH 60.1 05/08/2021    BP Readings from Last 3 Encounters:  10/18/21 111/76  09/27/21 123/76  08/29/21 106/67

## 2021-10-31 MED ORDER — METFORMIN HCL 500 MG PO TABS: ORAL_TABLET | ORAL | 1 refills | Status: DC

## 2021-10-31 NOTE — Telephone Encounter (Signed)
Sent per Dr. Dalbert Garnet.

## 2021-10-31 NOTE — Telephone Encounter (Signed)
Metformin 500 mg BID

## 2021-10-31 NOTE — Telephone Encounter (Signed)
Ok to rf x 1

## 2021-11-19 ENCOUNTER — Other Ambulatory Visit: Payer: Self-pay

## 2021-11-19 ENCOUNTER — Ambulatory Visit (INDEPENDENT_AMBULATORY_CARE_PROVIDER_SITE_OTHER): Payer: 59 | Admitting: Family Medicine

## 2021-11-19 ENCOUNTER — Encounter (INDEPENDENT_AMBULATORY_CARE_PROVIDER_SITE_OTHER): Payer: Self-pay | Admitting: Family Medicine

## 2021-11-19 VITALS — BP 109/72 | HR 81 | Temp 97.8°F | Ht 65.0 in | Wt 208.0 lb

## 2021-11-19 DIAGNOSIS — E88819 Insulin resistance, unspecified: Secondary | ICD-10-CM | POA: Insufficient documentation

## 2021-11-19 DIAGNOSIS — Z6834 Body mass index (BMI) 34.0-34.9, adult: Secondary | ICD-10-CM | POA: Diagnosis not present

## 2021-11-19 DIAGNOSIS — E669 Obesity, unspecified: Secondary | ICD-10-CM | POA: Diagnosis not present

## 2021-11-19 DIAGNOSIS — E8881 Metabolic syndrome: Secondary | ICD-10-CM | POA: Diagnosis not present

## 2021-11-20 MED ORDER — METFORMIN HCL 500 MG PO TABS: ORAL_TABLET | ORAL | 0 refills | Status: DC

## 2021-11-20 NOTE — Progress Notes (Signed)
Chief Complaint:   OBESITY Kerissa is here to discuss her progress with her obesity treatment plan along with follow-up of her obesity related diagnoses. Hoang is on the Category 2 Plan and keeping a food journal and adhering to recommended goals of 400-550 calories and 40+ grams of protein at supper daily and states she is following her eating plan approximately 95-98% of the time. Tsering states she is walking for 30 minutes 5-6 times per week.  Today's visit was #: 11 Starting weight: 241 lbs Starting date: 05/08/2021 Today's weight: 208 lbs Today's date: 11/19/2021 Total lbs lost to date: 33 Total lbs lost since last in-office visit: 3  Interim History: Heavenleigh continues to do well with weight loss. She is working and walking regularly, and she has signed up for the 5K in April.  Subjective:   1. Insulin resistance Homer is stable on metformin 2 times per day. She notes her polyphagia has increased with her increase in exercise.  Assessment/Plan:   1. Insulin resistance We will refill metformin 500 mg BID #60 for 1 month. Latia will continue with diet, exercise, and will add extra 100 calorie protein snacks. Zari agreed to follow-up with [place] as directed to closely monitor her progress.  2. Obesity, current BMI 34.7 Ainsleigh is currently in the action stage of change. As such, her goal is to continue with weight loss efforts. She has agreed to the Category 2 Plan and keeping a food journal and adhering to recommended goals of 400-550 calories and 40+ grams of protein at supper daily.   Exercise goals: As is.  Behavioral modification strategies: emotional eating strategies.  Darius has agreed to follow-up with our clinic in 3 weeks. She was informed of the importance of frequent follow-up visits to maximize her success with intensive lifestyle modifications for her multiple health conditions.   Objective:   Blood pressure 109/72, pulse 81, temperature 97.8 F (36.6  C), height 5\' 5"  (1.651 m), weight 208 lb (94.3 kg), SpO2 99 %, unknown if currently breastfeeding. Body mass index is 34.61 kg/m.  General: Cooperative, alert, well developed, in no acute distress. HEENT: Conjunctivae and lids unremarkable. Cardiovascular: Regular rhythm.  Lungs: Normal work of breathing. Neurologic: No focal deficits.   Lab Results  Component Value Date   CREATININE 0.66 10/18/2021   BUN 13 10/18/2021   NA 140 10/18/2021   K 4.2 10/18/2021   CL 103 10/18/2021   CO2 24 10/18/2021   Lab Results  Component Value Date   ALT 12 10/18/2021   AST 16 10/18/2021   ALKPHOS 101 10/18/2021   BILITOT 0.4 10/18/2021   Lab Results  Component Value Date   HGBA1C 5.1 10/18/2021   HGBA1C 5.3 05/08/2021   Lab Results  Component Value Date   INSULIN 6.8 10/18/2021   INSULIN 21.1 05/08/2021   Lab Results  Component Value Date   TSH 2.060 05/08/2021   Lab Results  Component Value Date   CHOL 131 10/18/2021   HDL 41 10/18/2021   LDLCALC 74 10/18/2021   TRIG 79 10/18/2021   Lab Results  Component Value Date   VD25OH 47.1 10/18/2021   VD25OH 60.1 05/08/2021   Lab Results  Component Value Date   WBC 7.2 05/08/2021   HGB 15.1 05/08/2021   HCT 45.9 05/08/2021   MCV 88 05/08/2021   PLT 243 05/08/2021   No results found for: IRON, TIBC, FERRITIN  Attestation Statements:   Reviewed by clinician on day of visit: allergies,  medications, problem list, medical history, surgical history, family history, social history, and previous encounter notes.  Time spent on visit including pre-visit chart review and post-visit care and charting was 30 minutes.    I, Burt Knack, am acting as transcriptionist for Quillian Quince, MD.  I have reviewed the above documentation for accuracy and completeness, and I agree with the above. -  Quillian Quince, MD

## 2021-11-21 ENCOUNTER — Other Ambulatory Visit: Payer: Self-pay

## 2021-11-21 ENCOUNTER — Ambulatory Visit: Payer: 59 | Attending: Obstetrics and Gynecology

## 2021-11-21 DIAGNOSIS — R279 Unspecified lack of coordination: Secondary | ICD-10-CM | POA: Insufficient documentation

## 2021-11-21 DIAGNOSIS — M6281 Muscle weakness (generalized): Secondary | ICD-10-CM | POA: Insufficient documentation

## 2021-11-21 DIAGNOSIS — M62838 Other muscle spasm: Secondary | ICD-10-CM | POA: Insufficient documentation

## 2021-11-21 NOTE — Therapy (Signed)
[facility] [place], [place], [address] Phone: [phone]   Fax:  [phone]  Physical Therapy Evaluation  Patient Details  Name: Leah Schneider MRN: [id number] Date of Birth: Aug 28, 1986 No data recorded  Encounter Date: 11/21/2021   PT End of Session - 11/21/21 1708     Visit Number 1    Date for PT Re-Evaluation 02/13/22    Authorization Type [employer]    PT Start Time 1623    PT Stop Time 1704    PT Time Calculation (min) 41 min    Activity Tolerance Patient tolerated treatment well    Behavior During Therapy WFL for tasks assessed/performed             Past Medical History:  Diagnosis Date   Back pain    Constipation    Edema of both lower legs    Headache    Herpes    IBS (irritable bowel syndrome)    Memory change    No pertinent past medical history    Thyroid condition     Past Surgical History:  Procedure Laterality Date   CESAREAN SECTION     CESAREAN SECTION  12/19/2011   Procedure: CESAREAN SECTION;  Surgeon: Jonnie Kind, MD;  Location: [facility];  Service: Gynecology;  Laterality: N/A;   EYE SURGERY     per patient "laser treatment to boyh eyes about 2-3 years ago in the summer"   THYROIDECTOMY Left 01/31/2020   Procedure: LEFT THYROID LOBECTOMY WITH FROZEN SECTION;  Surgeon: Izora Gala, MD;  Location: [facility];  Service: ENT;  Laterality: Left;    There were no vitals filed for this visit.    Subjective Assessment - 11/21/21 1624     Subjective Pt states that she has been having stress urinary incontinence with working out for several years but getting worse (2 years). She has also reached a point where she is fearful of playing with children because of leaking.    Patient Stated Goals Decrease urinary incontinence    Currently in Pain? No/denies    Multiple Pain Sites No                [facility] PT Assessment - 11/21/21 0001       Assessment   Medical Diagnosis Female  stress incontinence - N39.3 stress incontinence (female)(female)    Onset Date/Surgical Date 10/29/19    Next MD Visit none scheduled    Prior Therapy no      Precautions   Precautions None      Restrictions   Weight Bearing Restrictions No      Balance Screen   Has the patient fallen in the past 6 months No    Has the patient had a decrease in activity level because of a fear of falling?  No    Is the patient reluctant to leave their home because of a fear of falling?  No      Home Social worker Private residence    Living Arrangements Children;Spouse/significant other      Prior Function   Level of Independence Independent    Vocation Full time employment   Wellsite geologist     Cognition   Overall Cognitive Status Within Functional Limits for tasks assessed      Palpation   Palpation comment Tenderness in Bil lower quadrant and more notable tenderness directly over bladder with urgency (pt had just urinated)  No emotional/communication barriers or cognitive limitation. Patient is motivated to learn. Patient understands and agrees with treatment goals and plan. PT explains patient will be examined in standing, sitting, and lying down to see how their muscles and joints work. When they are ready, they will be asked to remove their underwear so PT can examine their perineum. The patient is also given the option of providing their own chaperone as one is not provided in our facility. The patient also has the right and is explained the right to defer or refuse any part of the evaluation or treatment including the internal exam. With the patient's consent, PT will use one gloved finger to gently assess the muscles of the pelvic floor, seeing how well it contracts and relaxes and if there is muscle symmetry. After, the patient will get dressed and PT and patient will discuss exam findings and plan of care. PT and patient discuss plan of  care, schedule, attendance policy and HEP activities.    Objective measurements completed on examination: See above findings.     Pelvic Floor Special Questions - 11/21/21 0001     Prior Pelvic/Prostate Exam Yes    Are you Pregnant or attempting pregnancy? No    Prior Pregnancies Yes    Number of Pregnancies 3    Number of C-Sections 0    Number of Vaginal Deliveries 2    Episiotomy Performed No   tearing with both   Currently Sexually Active No   no consistent pain in the past   History of sexually transmitted disease Yes   pt states years ago; currently has herpes   Urinary Leakage Yes    How often daily    Pad use yes, during exercise    Activities that cause leaking Coughing;Sneezing;Laughing;Bending;Running;Exercising   Jumping   Urinary urgency Yes   occasionally - when she gets home from work   Urinary frequency 2-3    Fecal incontinence No   first and only accident to date the other day with severe urgency; IBS; constipation - not sure how many she has a week; 0-3x/day; straining   Fluid intake 64 oz    Caffeine beverages yes    Falling out feeling (prolapse) No    Skin Integrity Intact    Scar Tear    Perineal Body/Introitus  Normal    External Palpation tenderness at pubic symphysis and superficial pelvic floor    Prolapse Anterior Wall   Grade 1   Pelvic Floor Internal Exam Pt identity confirmed and verbal consent provided for internal pelvic exam    Exam Type Vaginal    Sensation WNL    Palpation sharp pain throughout pelvic floor, most notable at perineal scar tissue 6 o'clock    Strength --   Pt not able to find active contraction - only abdominal bracing and gluteal contraction   Strength # of reps 0    Strength # of seconds 0    Tone High              [facility] Adult PT Treatment/Exercise - 11/21/21 0001       Self-Care   Self-Care Other Self-Care Comments   bladder journal, squatty potty, relaxed toileting mechanics, vulvovaginal massage, pressure  management     Neuro Re-ed    Neuro Re-ed Details  Diaphragmatic breathing with internal TCs for pelvic floor relaxation                     PT Education -  11/21/21 1707     Education Details Pt educaiton performed on bladder journal, squatty poty, relaxed toileting mechanics, pressure management, vulvovaginal massage, and importanceof pelvic floor relaxation training before strengthening. [email or web address]    Person(s) Educated Patient    Methods Explanation;Demonstration;Tactile cues;Verbal cues;Handout    Comprehension Verbalized understanding              PT Short Term Goals - 11/21/21 1640       PT SHORT TERM GOAL #1   Title Pt will be independent with HEP.    Time 4    Period Weeks    Status New    Target Date 12/19/21      PT SHORT TERM GOAL #2   Title Pt will be independent with the knack and urge suppression technique in order to improve bladder habits and decrease urinary incontinence.    Time 4    Period Weeks    Status New    Target Date 12/19/21      PT SHORT TERM GOAL #3   Title Pt will be able to correctly performing diaphragmatic breathing and appropriate pressure management in order to prevent worsening vaginal wall laxity and improve pelvic floor A/ROM.    Time 4    Status New    Target Date 12/19/21               PT Long Term Goals - 11/21/21 1720       PT LONG TERM GOAL #1   Title Pt will be independent with advanced HEP.    Time 12    Period Weeks    Status New    Target Date 02/13/22      PT LONG TERM GOAL #2   Title Pt will report no leaks with laughing, coughing, sneezing, jumping, and exercise in order to improve comfort with interpersonal relationships and community activities.    Time 12    Period Weeks    Status New    Target Date 02/13/22      PT LONG TERM GOAL #3   Title Pt will demonstrate normal pelvic floor muscle tone and A/ROM, able to achieve 3/5 strength with contractions and 10 sec endurance, in order to  provide appropriate lumbopelvic support in functional activities.    Time 12    Period Weeks    Status New    Target Date 02/13/22                    Plan - 11/21/21 1708     Clinical Impression Statement Pt is a 36 year old female with chief complaint of stress urinary incontinence with working out and playing with kids for the last 2 years. Exam finding are notable for tenderness in lower abdomen (most significant over bladder), bracing of abdominals with movement, poor pelvic floor coordiantion and inability to find active contraction at this time, bracing abdominals and gluteal with pelvic floor contraction attempt, pain and restriction throughout superficial and deep pelvic floor muscles, and grade 1 anterior vaginal wall laxity. Signs and symptoms are most consistent with pelvic floor tension, poor pelvic floor coordination, and weakness. Initial treatment included diaphragmatic breathing to improve pelvic floor A/ROM and relaxation with good improvements; we discussed importance of this relaxation training, squatty potty, relaxed toileting mechanics, pressure management, vulvovaginal massage, and bladder journal. She will benefit from skilled PT intervention in order to address impairments, decrease urinary incontinence, and progress towards goal completion.    Personal Factors and Comorbidities Comorbidity 1;Comorbidity  2;Comorbidity 3+    Comorbidities Chronic constipation/IBS, c-section 11/29/2011, 2 vaginal deliveries with tearing    Examination-Activity Limitations Continence    Examination-Participation Restrictions Community Activity;Interpersonal Relationship    Stability/Clinical Decision Making Stable/Uncomplicated    Clinical Decision Making Low    Rehab Potential Good    PT Frequency 1x / week    PT Duration 12 weeks    PT Treatment/Interventions ADLs/Self Care Home Management;Biofeedback;Cryotherapy;Electrical Stimulation;Moist Heat;Therapeutic activities;Therapeutic  exercise;Neuromuscular re-education;Manual techniques;Patient/family education;Scar mobilization;Passive range of motion;Dry needling;Spinal Manipulations    PT Next Visit Plan Manual techniques to help improve pelvic floor coordination; down training; strict bladder retraining.    PT Home Exercise Plan [email or web address]    Consulted and Agree with Plan of Care Patient             Patient will benefit from skilled therapeutic intervention in order to improve the following deficits and impairments:  Decreased coordination, Decreased range of motion, Increased fascial restricitons, Impaired tone, Decreased endurance, Increased muscle spasms, Pain, Decreased activity tolerance, Decreased scar mobility, Hypomobility, Decreased mobility, Decreased strength  Visit Diagnosis: Other muscle spasm  Muscle weakness (generalized)  Unspecified lack of coordination     Problem List Patient Active Problem List   Diagnosis Date Noted   Insulin resistance 11/19/2021   Polyphagia 07/11/2021   S/P thyroidectomy 01/31/2020   Migraine without aura 01/20/2017   Active labor 10/09/2013   Abnormal fetal ultrasound 12/15/2011   Small for dates affecting management of mother 12/15/2011   Abnormal genetic test in pregnancy 12/03/2011   Pregnancy, supervision of, high-risk 12/03/2011   HSV-2 infection complicating pregnancy [id number]    Heather Roberts, PT, DPT01/25/235:22 PM   [facility] @ [place], [place], [address] Phone: [phone]   Fax:  [phone]  Name: Leah Schneider MRN: [id number] Date of Birth: 1986/05/31

## 2021-11-21 NOTE — Patient Instructions (Addendum)
Squatty potty: When your knees are level or below the level of your hips, pelvic floor muscles are pressed against rectum, preventing ease of bowel movement. By getting knees above the level of the hips, these pelvic floor muscles relax, allowing easier passage of bowel movement. ? Ways to get knees above hips: o Squatty Potty (7inch and 9inch versions) o Small stool o Roll of toilet paper under each foot o Hardback book or stack of magazines under each foot  Relaxed Toileting mechanics: Once in this position, make sure to lean forward with forearms on thighs, wide knees, relaxed stomach, and breathe.  Vulvar/vaginal Massage: This is a technique to help decrease painful sensitivity in the vaginal area. It can also help to restore normal moisture levels in the vaginal tissues. With coconut oil, aloe, jojoba oil, or a specific vaginal moisturizer, gently massage into vaginal tissues. Think of this as part of your post-shower routine and moisturizing just like you would the rest of the body with lotion. This helps to increase good blood flow to the vaginal tissues. In addition, it also teaches the body that touch to the vagina does not have to be painful or threatening, but moisturizing and gentle.  Access Code: [email or web address] URL: [email or web address] Date: 11/21/2021 Prepared by: Julio Alm  Exercises Supine Diaphragmatic Breathing - 5 x daily - 7 x weekly - 1 sets - 5 reps  [facility] [address] [place], [place] [address] Phone # [phone] Fax [phone]

## 2021-12-05 ENCOUNTER — Other Ambulatory Visit: Payer: Self-pay

## 2021-12-05 ENCOUNTER — Ambulatory Visit: Payer: 59 | Attending: Obstetrics and Gynecology

## 2021-12-05 DIAGNOSIS — M6281 Muscle weakness (generalized): Secondary | ICD-10-CM | POA: Diagnosis present

## 2021-12-05 DIAGNOSIS — M62838 Other muscle spasm: Secondary | ICD-10-CM | POA: Insufficient documentation

## 2021-12-05 DIAGNOSIS — R279 Unspecified lack of coordination: Secondary | ICD-10-CM | POA: Insufficient documentation

## 2021-12-05 NOTE — Therapy (Signed)
[facility] @ [place], [place], [address] Phone: [phone]   Fax:  [phone]  Physical Therapy Treatment  Patient Details  Name: Leah Schneider MRN: [id number] Date of Birth: Jan 26, 1986 No data recorded  Encounter Date: 12/05/2021   PT End of Session - 12/05/21 1611     Visit Number 2    Date for PT Re-Evaluation 02/13/22    Authorization Type Aetna    PT Start Time 1612    PT Stop Time 1657    PT Time Calculation (min) 45 min    Activity Tolerance Patient tolerated treatment well    Behavior During Therapy WFL for tasks assessed/performed             Past Medical History:  Diagnosis Date   Back pain    Constipation    Edema of both lower legs    Headache    Herpes    IBS (irritable bowel syndrome)    Memory change    No pertinent past medical history    Thyroid condition     Past Surgical History:  Procedure Laterality Date   CESAREAN SECTION     CESAREAN SECTION  12/19/2011   Procedure: CESAREAN SECTION;  Surgeon: Jonnie Kind, MD;  Location: [facility];  Service: Gynecology;  Laterality: N/A;   EYE SURGERY     per patient "laser treatment to boyh eyes about 2-3 years ago in the summer"   THYROIDECTOMY Left 01/31/2020   Procedure: LEFT THYROID LOBECTOMY WITH FROZEN SECTION;  Surgeon: Izora Gala, MD;  Location: [facility];  Service: ENT;  Laterality: Left;    There were no vitals filed for this visit.   Subjective Assessment - 12/05/21 1611     Subjective Pt states that she has been working hard on breathing. She has ordered squatty potty which she states was helpful. She has not noticed any changes with incontinence and had complete leakage with exercising to the point she has to change clothes or pad.    Patient Stated Goals Decrease urinary incontinence    Currently in Pain? No/denies    Multiple Pain Sites No                      No emotional/communication barriers or  cognitive limitation. Patient is motivated to learn. Patient understands and agrees with treatment goals and plan. PT explains patient will be examined in standing, sitting, and lying down to see how their muscles and joints work. When they are ready, they will be asked to remove their underwear so PT can examine their perineum. The patient is also given the option of providing their own chaperone as one is not provided in our facility. The patient also has the right and is explained the right to defer or refuse any part of the evaluation or treatment including the internal exam. With the patient's consent, PT will use one gloved finger to gently assess the muscles of the pelvic floor, seeing how well it contracts and relaxes and if there is muscle symmetry. After, the patient will get dressed and PT and patient will discuss exam findings and plan of care. PT and patient discuss plan of care, schedule, attendance policy and HEP activities.          [facility] Adult PT Treatment/Exercise - 12/05/21 0001       Self-Care   Self-Care Other Self-Care Comments   strict bowel.bladder retraining, water/fiber intake  Neuro Re-ed    Neuro Re-ed Details  Diaphragmatic breathing with internal TCs for pelvic floor relaxation; pelvic floor A/ROM training with breath coordination for pelvic floor relaxation 10x      Exercises   Exercises Lumbar      Lumbar Exercises: Supine   Other Supine Lumbar Exercises Happy baby 10 breaths      Lumbar Exercises: Quadruped   [place]/Old Horse 20 reps   breath coordination   Other Quadruped Lumbar Exercises Child's pose 10 breaths      Manual Therapy   Manual Therapy Internal Pelvic Floor    Internal Pelvic Floor contraction training and deep pelvic floor muscle release                     PT Education - 12/05/21 1635     Education Details Pt education performed on strict bladder/bowel retraining program and fiber/water intake. HEP updated to include  down training activities. [email or web address]    Person(s) Educated Patient    Methods Explanation;Demonstration;Tactile cues;Verbal cues;Handout    Comprehension Verbalized understanding              PT Short Term Goals - 11/21/21 1640       PT SHORT TERM GOAL #1   Title Pt will be independent with HEP.    Time 4    Period Weeks    Status New    Target Date 12/19/21      PT SHORT TERM GOAL #2   Title Pt will be independent with the knack and urge suppression technique in order to improve bladder habits and decrease urinary incontinence.    Time 4    Period Weeks    Status New    Target Date 12/19/21      PT SHORT TERM GOAL #3   Title Pt will be able to correctly performing diaphragmatic breathing and appropriate pressure management in order to prevent worsening vaginal wall laxity and improve pelvic floor A/ROM.    Time 4    Status New    Target Date 12/19/21               PT Long Term Goals - 11/21/21 1720       PT LONG TERM GOAL #1   Title Pt will be independent with advanced HEP.    Time 12    Period Weeks    Status New    Target Date 02/13/22      PT LONG TERM GOAL #2   Title Pt will report no leaks with laughing, coughing, sneezing, jumping, and exercise in order to improve comfort with interpersonal relationships and community activities.    Time 12    Period Weeks    Status New    Target Date 02/13/22      PT LONG TERM GOAL #3   Title Pt will demonstrate normal pelvic floor muscle tone and A/ROM, able to achieve 3/5 strength with contractions and 10 sec endurance, in order to provide appropriate lumbopelvic support in functional activities.    Time 12    Period Weeks    Status New    Target Date 02/13/22                   Plan - 12/05/21 1642     Clinical Impression Statement Discussed in detail strict bowel/bladder retraining program for 2 weeks in order to help decrease irritatoin and leaking; pt agreeable to try and we went through how to  adjust voiding  interval with work schedule. We went over good sources of fiber and discussing with dietician how to include and tract fiber intake at next appointment. She tolerated all down training very well with good breath and relaxation awareness. Deep pelvic floor tension still present; good response to manual release and diaphragmatic breathing to help release. She was able to perform pelvic floor A/ROM with increased contraction coordination and strength with breathing - included in HEP. She will continue to benefit from skilled PT intervention in order to decrease urinary incontinence and improve QOL.    PT Treatment/Interventions ADLs/Self Care Home Management;Biofeedback;Cryotherapy;Electrical Stimulation;Moist Heat;Therapeutic activities;Therapeutic exercise;Neuromuscular re-education;Manual techniques;Patient/family education;Scar mobilization;Passive range of motion;Dry needling;Spinal Manipulations    PT Next Visit Plan Review how she is responding to initial bowel/bladder retraining; consider return to internal for progression of strengthening and to teach the knack; begin core and hip strengthening program.    PT Home Exercise Plan [email or web address]    Consulted and Agree with Plan of Care Patient             Patient will benefit from skilled therapeutic intervention in order to improve the following deficits and impairments:  Decreased coordination, Decreased range of motion, Increased fascial restricitons, Impaired tone, Decreased endurance, Increased muscle spasms, Pain, Decreased activity tolerance, Decreased scar mobility, Hypomobility, Decreased mobility, Decreased strength  Visit Diagnosis: Other muscle spasm  Muscle weakness (generalized)  Unspecified lack of coordination     Problem List Patient Active Problem List   Diagnosis Date Noted   Insulin resistance 11/19/2021   Polyphagia 07/11/2021   S/P thyroidectomy 01/31/2020   Migraine without aura 01/20/2017   Active  labor 10/09/2013   Abnormal fetal ultrasound 12/15/2011   Small for dates affecting management of mother 12/15/2011   Abnormal genetic test in pregnancy 12/03/2011   Pregnancy, supervision of, high-risk 12/03/2011   HSV-2 infection complicating pregnancy [id number]    Heather Roberts, PT, DPT02/08/235:00 PM   [facility] @ [place], [place], [address] Phone: [phone]   Fax:  [phone]  Name: Leah Schneider MRN: [id number] Date of Birth: January 10, 1986

## 2021-12-05 NOTE — Patient Instructions (Signed)
Bladder/bowel retraining:  8-12 grams of fiber with each meal Drink 4-8oz an hour ONLY WATER Stop water intake 3 hours before bed Try to go at least 2-3 hours between trips to the bathroom when possible - go whether you need to or not. We discussed for you specifically during work to go right before class starts at 7:15, then 10:45, then 12:00, then 2:45, then start the 2-3 hour interval. When you're not at work try and stick to the every 2-3 hour schedule.   For two weeks.   Fiber:  ? Soluble fiber: o Slows down digestion o Increase sensation of fullness o Lowers cholesterol o Improves blood sugar  ? Insoluble fiber o Speeds up digestion o Eases and prevents constipation o Good for overall colon health  Examples of fiber rich foods: Soluble Fiber Insoluble Fiber  Strawberries Strawberries  Blueberries Blueberries  Apples Apples  Beans Peas  Nuts and Seeds Walnuts and Almonds  Oatmeal Whole grains  Avocados Avocados  Brussels sprouts Coconut  Sweet potatoes Radish  Broccoli Popcorn  Turnips Turnips  Apricots Passionfruit  Nectarines Cauliflower  Carrots Prune

## 2021-12-13 ENCOUNTER — Ambulatory Visit (INDEPENDENT_AMBULATORY_CARE_PROVIDER_SITE_OTHER): Payer: 59 | Admitting: Family Medicine

## 2021-12-13 ENCOUNTER — Encounter (INDEPENDENT_AMBULATORY_CARE_PROVIDER_SITE_OTHER): Payer: Self-pay | Admitting: Family Medicine

## 2021-12-13 ENCOUNTER — Other Ambulatory Visit: Payer: Self-pay

## 2021-12-13 VITALS — BP 105/67 | HR 72 | Temp 97.9°F | Ht 65.0 in | Wt 200.0 lb

## 2021-12-13 DIAGNOSIS — E559 Vitamin D deficiency, unspecified: Secondary | ICD-10-CM | POA: Diagnosis not present

## 2021-12-13 DIAGNOSIS — Z9189 Other specified personal risk factors, not elsewhere classified: Secondary | ICD-10-CM

## 2021-12-13 DIAGNOSIS — E8881 Metabolic syndrome: Secondary | ICD-10-CM

## 2021-12-13 DIAGNOSIS — E669 Obesity, unspecified: Secondary | ICD-10-CM

## 2021-12-13 DIAGNOSIS — Z6834 Body mass index (BMI) 34.0-34.9, adult: Secondary | ICD-10-CM | POA: Diagnosis not present

## 2021-12-13 DIAGNOSIS — Z6841 Body Mass Index (BMI) 40.0 and over, adult: Secondary | ICD-10-CM

## 2021-12-13 MED ORDER — METFORMIN HCL 500 MG PO TABS: ORAL_TABLET | ORAL | 0 refills | Status: DC

## 2021-12-13 NOTE — Progress Notes (Signed)
Chief Complaint:   OBESITY Leah Schneider is here to discuss her progress with her obesity treatment plan along with follow-up of her obesity related diagnoses. Leah Schneider is on the Category 2 Plan and keeping a food journal and adhering to recommended goals of 400-550 calories and 40+ grams of protein at supper daily and states she is following her eating plan approximately 90% of the time. Leah Schneider states she is walking and doing cardio for 30 minutes 7 times per week.  Today's visit was #: 12 Starting weight: 241 lbs Starting date: 05/08/2021 Today's weight: 200 lbs Today's date: 12/13/2021 Total lbs lost to date: 41 Total lbs lost since last in-office visit: 8  Interim History: Leah Schneider continues to do very well with weight loss on her plan. Her physical therapist asked her to increase her fiber, and she has questions on how to do this.   Subjective:   1. Insulin resistance [name] is stable on metformin, and she is working diet and exercise. No side effects were noted.   2. Vitamin D deficiency Leah Schneider's Vit D level is almost at goal. It should be improving with weight loss. Her fatigue is improving.  3. At risk for heart disease Leah Schneider is at higher than average risk for cardiovascular disease due to obesity.  Assessment/Plan:   1. Insulin resistance Kennidy will continue to work on weight loss, exercise, and decreasing simple carbohydrates to help decrease the risk of diabetes. We will refill metformin for 1 month, and we will recheck labs in 2 months. Elisabel agreed to follow-up with [place] as directed to closely monitor her progress.  - metFORMIN (GLUCOPHAGE) 500 MG tablet; TAKE 1 TABLET BY MOUTH TWICE DAILY WITH A MEAL VISIT  NEEDED  FOR  REFILLS Strength: 500 mg  Dispense: 60 tablet; Refill: 0  2. Vitamin D deficiency Low Vitamin D level contributes to fatigue and are associated with obesity, breast, and colon cancer. We will recheck labs in 2 months to reassess, and she will  continue as is. She will follow-up for routine testing of Vitamin D, at least 2-3 times per year to avoid over-replacement.  3. At risk for heart disease Leah Schneider was given approximately 15 minutes of coronary artery disease prevention counseling today. She is 36 y.o. female and has risk factors for heart disease including obesity. We discussed intensive lifestyle modifications today with an emphasis on specific weight loss instructions and strategies.  Repetitive spaced learning was employed today to elicit superior memory formation and behavioral change.   4. Obesity, current BMI 34.7 Leah Schneider is currently in the action stage of change. As such, her goal is to continue with weight loss efforts. She has agreed to the Category 2 Plan and keeping a food journal and adhering to recommended goals of 400-500 calories and 40+ grams of protein at supper daily.    Fiber rich soup recipes were given.  Exercise goals: As is.  Behavioral modification strategies: increasing lean protein intake.  Leah Schneider has agreed to follow-up with our clinic in 3 weeks. She was informed of the importance of frequent follow-up visits to maximize her success with intensive lifestyle modifications for her multiple health conditions.   Objective:   Blood pressure 105/67, pulse 72, temperature 97.9 F (36.6 C), height 5\' 5"  (1.651 m), weight 200 lb (90.7 kg), SpO2 100 %, unknown if currently breastfeeding. Body mass index is 33.28 kg/m.  General: Cooperative, alert, well developed, in no acute distress. HEENT: Conjunctivae and lids unremarkable. Cardiovascular: Regular rhythm.  Lungs:  Normal work of breathing. Neurologic: No focal deficits.   Lab Results  Component Value Date   CREATININE 0.66 10/18/2021   BUN 13 10/18/2021   NA 140 10/18/2021   K 4.2 10/18/2021   CL 103 10/18/2021   CO2 24 10/18/2021   Lab Results  Component Value Date   ALT 12 10/18/2021   AST 16 10/18/2021   ALKPHOS 101 10/18/2021    BILITOT 0.4 10/18/2021   Lab Results  Component Value Date   HGBA1C 5.1 10/18/2021   HGBA1C 5.3 05/08/2021   Lab Results  Component Value Date   INSULIN 6.8 10/18/2021   INSULIN 21.1 05/08/2021   Lab Results  Component Value Date   TSH 2.060 05/08/2021   Lab Results  Component Value Date   CHOL 131 10/18/2021   HDL 41 10/18/2021   LDLCALC 74 10/18/2021   TRIG 79 10/18/2021   Lab Results  Component Value Date   VD25OH 47.1 10/18/2021   VD25OH 60.1 05/08/2021   Lab Results  Component Value Date   WBC 7.2 05/08/2021   HGB 15.1 05/08/2021   HCT 45.9 05/08/2021   MCV 88 05/08/2021   PLT 243 05/08/2021   No results found for: IRON, TIBC, FERRITIN  Attestation Statements:   Reviewed by clinician on day of visit: allergies, medications, problem list, medical history, surgical history, family history, social history, and previous encounter notes.   I, Trixie Dredge, am acting as transcriptionist for Dennard Nip, MD.  I have reviewed the above documentation for accuracy and completeness, and I agree with the above. -  Dennard Nip, MD

## 2021-12-18 ENCOUNTER — Ambulatory Visit: Payer: 59

## 2021-12-18 ENCOUNTER — Other Ambulatory Visit: Payer: Self-pay

## 2021-12-18 DIAGNOSIS — M62838 Other muscle spasm: Secondary | ICD-10-CM | POA: Diagnosis not present

## 2021-12-18 DIAGNOSIS — R279 Unspecified lack of coordination: Secondary | ICD-10-CM

## 2021-12-18 DIAGNOSIS — M6281 Muscle weakness (generalized): Secondary | ICD-10-CM

## 2021-12-18 NOTE — Patient Instructions (Signed)
Access Code: [email or web address] URL: [email or web address] Date: 12/18/2021 Prepared by: Julio Alm  Exercises Supine Diaphragmatic Breathing - 5 x daily - 7 x weekly - 1 sets - 5 reps Supine Pelvic Floor Contraction - 2 x daily - 7 x weekly - 1 sets - 10 reps Cat Cow - 1 x daily - 7 x weekly - 2 sets - 10 reps Child's Pose Stretch - 1 x daily - 7 x weekly - 1 sets - 10 reps Happy Baby with Pelvic Floor Lengthening - 1 x daily - 7 x weekly - 1 sets - 10 reps Supine Transversus Abdominis Bracing with Leg Extension - 1 x daily - 7 x weekly - 2 sets - 10 reps Supine Dead Bug with Leg Extension - 1 x daily - 7 x weekly - 2 sets - 10 reps Supine Bridge with Mini Swiss Ball Between Knees - 1 x daily - 7 x weekly - 2 sets - 10 reps Supine Shoulder Horizontal Abduction with Resistance - 1 x daily - 7 x weekly - 2 sets - 10 reps

## 2021-12-18 NOTE — Therapy (Signed)
[facility] @ [place], [place], [address] Phone: [phone]   Fax:  [phone]  Physical Therapy Treatment  Patient Details  Name: Leah Schneider MRN: [id number] Date of Birth: [birth year] No data recorded  Encounter Date: 12/18/2021   PT End of Session - 12/18/21 1604     Visit Number 3    Date for PT Re-Evaluation 02/13/22    Authorization Type Aetna    PT Start Time 1606    PT Stop Time 1649    PT Time Calculation (min) 43 min    Activity Tolerance Patient tolerated treatment well    Behavior During Therapy WFL for tasks assessed/performed             Past Medical History:  Diagnosis Date   Back pain    Constipation    Edema of both lower legs    Headache    Herpes    IBS (irritable bowel syndrome)    Memory change    No pertinent past medical history    Thyroid condition     Past Surgical History:  Procedure Laterality Date   CESAREAN SECTION     CESAREAN SECTION  12/19/2011   Procedure: CESAREAN SECTION;  Surgeon: Jonnie Kind, MD;  Location: [facility];  Service: Gynecology;  Laterality: N/A;   EYE SURGERY     per patient "laser treatment to boyh eyes about 2-3 years ago in the summer"   THYROIDECTOMY Left 01/31/2020   Procedure: LEFT THYROID LOBECTOMY WITH FROZEN SECTION;  Surgeon: Izora Gala, MD;  Location: [facility];  Service: ENT;  Laterality: Left;    There were no vitals filed for this visit.   Subjective Assessment - 12/18/21 1605     Subjective Pt states that she has been working hard to stay on bladder retraining and has noticed that cutting out caffeine has bene very helpful. She has been working on staying on top of exercises, but sometimes she only gets them in once a day. When she was exercising this morning, she had less leaking, so she feels like leaking is easing up.    Patient Stated Goals Decrease urinary incontinence    Currently in Pain? No/denies    Multiple Pain Sites  No                               [facility] Adult PT Treatment/Exercise - 12/18/21 0001       Neuro Re-ed    Neuro Re-ed Details  Pelvic floor contraction training in seated and standing positions: 10x seated, 10x standing, 10x wide stance, 10x staggered stance; transversus abdominus training with breath coordination and multimodal cues - this was incorporated into supine march 2 x 10 and leg extensions 10x bil      Lumbar Exercises: Supine   Dead Bug 20 reps   cues for appropriate core facilitation and awareness of form   Bridge with [employer] 20 reps    Other Supine Lumbar Exercises Horizontal abduction with core facilitation 2 x 10 green band                     PT Education - 12/18/21 1615     Education Details Pt education performed on all exercise progressions and relationship between abdominal muscles and pelvic floor. [email or web address]    Person(s) Educated Patient    Methods Explanation;Demonstration;Tactile cues;Verbal cues;Handout    Comprehension Verbalized  understanding              PT Short Term Goals - 11/21/21 1640       PT SHORT TERM GOAL #1   Title Pt will be independent with HEP.    Time 4    Period Weeks    Status New    Target Date 12/19/21      PT SHORT TERM GOAL #2   Title Pt will be independent with the knack and urge suppression technique in order to improve bladder habits and decrease urinary incontinence.    Time 4    Period Weeks    Status New    Target Date 12/19/21      PT SHORT TERM GOAL #3   Title Pt will be able to correctly performing diaphragmatic breathing and appropriate pressure management in order to prevent worsening vaginal wall laxity and improve pelvic floor A/ROM.    Time 4    Status New    Target Date 12/19/21               PT Long Term Goals - 11/21/21 1720       PT LONG TERM GOAL #1   Title Pt will be independent with advanced HEP.    Time 12    Period Weeks    Status New    Target  Date 02/13/22      PT LONG TERM GOAL #2   Title Pt will report no leaks with laughing, coughing, sneezing, jumping, and exercise in order to improve comfort with interpersonal relationships and community activities.    Time 12    Period Weeks    Status New    Target Date 02/13/22      PT LONG TERM GOAL #3   Title Pt will demonstrate normal pelvic floor muscle tone and A/ROM, able to achieve 3/5 strength with contractions and 10 sec endurance, in order to provide appropriate lumbopelvic support in functional activities.    Time 12    Period Weeks    Status New    Target Date 02/13/22                   Plan - 12/18/21 1616     Clinical Impression Statement Pt is beginning to see small amount of progress with pelvic floor strengthening; believe this will become quicker as she is able to perform more pelvic floor strengthening in functional positions and progress core/hip strengthening. She did well with core activation trianing and able to progress through dead bugs with good control and breath coordination. Good tolerance to progressing pelvic floor quick flicks to seated and various standing positions while emphasizing relaxation part of contraction; pt did report increased difficulty, but felt like she was able to perform. She will continue to benefit from skilled PT intervention in order to decrease urinary incontinence and progress functional strengthening.    PT Treatment/Interventions ADLs/Self Care Home Management;Biofeedback;Cryotherapy;Electrical Stimulation;Moist Heat;Therapeutic activities;Therapeutic exercise;Neuromuscular re-education;Manual techniques;Patient/family education;Scar mobilization;Passive range of motion;Dry needling;Spinal Manipulations    PT Next Visit Plan Plan to reassess internal pelvic floor tension to make sure it is not increasing; teach the knack; progress hip/core strengthening.    PT Home Exercise Plan [email or web address]    Consulted and Agree with Plan of  Care Patient             Patient will benefit from skilled therapeutic intervention in order to improve the following deficits and impairments:  Decreased coordination, Decreased range of motion, Increased fascial  restricitons, Impaired tone, Decreased endurance, Increased muscle spasms, Pain, Decreased activity tolerance, Decreased scar mobility, Hypomobility, Decreased mobility, Decreased strength  Visit Diagnosis: Other muscle spasm  Muscle weakness (generalized)  Unspecified lack of coordination     Problem List Patient Active Problem List   Diagnosis Date Noted   Insulin resistance 11/19/2021   Polyphagia 07/11/2021   S/P thyroidectomy 01/31/2020   Migraine without aura 01/20/2017   Active labor 10/09/2013   Abnormal fetal ultrasound 12/15/2011   Small for dates affecting management of mother 12/15/2011   Abnormal genetic test in pregnancy 12/03/2011   Pregnancy, supervision of, high-risk 12/03/2011   HSV-2 infection complicating pregnancy 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

## 2021-12-26 ENCOUNTER — Other Ambulatory Visit: Payer: Self-pay

## 2021-12-26 ENCOUNTER — Ambulatory Visit: Payer: 59 | Attending: Obstetrics and Gynecology

## 2021-12-26 DIAGNOSIS — M6281 Muscle weakness (generalized): Secondary | ICD-10-CM | POA: Insufficient documentation

## 2021-12-26 DIAGNOSIS — M62838 Other muscle spasm: Secondary | ICD-10-CM | POA: Insufficient documentation

## 2021-12-26 DIAGNOSIS — R279 Unspecified lack of coordination: Secondary | ICD-10-CM | POA: Insufficient documentation

## 2021-12-26 NOTE — Patient Instructions (Signed)
Access Code: [email or web address] ?URL: [email or web address] ?Date: 12/26/2021 ?Prepared by: Heather Roberts ? ?Exercises ?Supine Diaphragmatic Breathing - 5 x daily - 7 x weekly - 1 sets - 5 reps ?Supine Pelvic Floor Contraction - 2 x daily - 7 x weekly - 1 sets - 10 reps ?Cat Cow - 1 x daily - 7 x weekly - 2 sets - 10 reps ?Child's Pose Stretch - 1 x daily - 7 x weekly - 1 sets - 10 reps ?Happy Baby with Pelvic Floor Lengthening - 1 x daily - 7 x weekly - 1 sets - 10 reps ?Supine Transversus Abdominis Bracing with Leg Extension - 1 x daily - 7 x weekly - 2 sets - 10 reps ?Supine Dead Bug with Leg Extension - 1 x daily - 7 x weekly - 2 sets - 10 reps ?Supine Bridge with Mini Swiss Ball Between Knees - 1 x daily - 7 x weekly - 2 sets - 10 reps ?Standing Bilateral Low Shoulder Row with Anchored Resistance - 1 x daily - 7 x weekly - 2 sets - 10 reps ?Shoulder extension with resistance - Neutral - 1 x daily - 7 x weekly - 2 sets - 10 reps ?Standing Anti-Rotation Press with Anchored Resistance - 1 x daily - 7 x weekly - 2 sets - 10 reps ?Squat - 1 x daily - 7 x weekly - 2 sets - 10 reps ?Standing Heel Raise - 1 x daily - 7 x weekly - 3 sets - 10 reps ? ?

## 2021-12-26 NOTE — Therapy (Signed)
[facility] [address] [name], [place], [address] Phone: [phone]   Fax:  [phone]  Physical Therapy Treatment  Patient Details  Name: Leah Schneider MRN: [id number] Date of Birth: 04-Jun-1986 No data recorded  Encounter Date: 12/26/2021   PT End of Session - 12/26/21 1647     Visit Number 4    Date for PT Re-Evaluation 02/13/22    Authorization Type Aetna    PT Start Time 1617    PT Stop Time 1658    PT Time Calculation (min) 41 min    Activity Tolerance Patient tolerated treatment well    Behavior During Therapy WFL for tasks assessed/performed             Past Medical History:  Diagnosis Date   Back pain    Constipation    Edema of both lower legs    Headache    Herpes    IBS (irritable bowel syndrome)    Memory change    No pertinent past medical history    Thyroid condition     Past Surgical History:  Procedure Laterality Date   CESAREAN SECTION     CESAREAN SECTION  12/19/2011   Procedure: CESAREAN SECTION;  Surgeon: Jonnie Kind, MD;  Location: [facility];  Service: Gynecology;  Laterality: N/A;   EYE SURGERY     per patient "laser treatment to boyh eyes about 2-3 years ago in the summer"   THYROIDECTOMY Left 01/31/2020   Procedure: LEFT THYROID LOBECTOMY WITH FROZEN SECTION;  Surgeon: Izora Gala, MD;  Location: [facility];  Service: ENT;  Laterality: Left;    There were no vitals filed for this visit.   Subjective Assessment - 12/26/21 1617     Subjective Pt states that she is doing better about incorporating pelvic floor contractions into daily activities and evenings are the hardest time to work in. She feels more confident in proper performance of these contractions. She is still having some leaking with work outs, but feels like it is getting better.    Patient Stated Goals Decrease urinary incontinence    Currently in Pain? No/denies    Multiple Pain Sites No                     No emotional/communication barriers or cognitive limitation. Patient is motivated to learn. Patient understands and agrees with treatment goals and plan. PT explains patient will be examined in standing, sitting, and lying down to see how their muscles and joints work. When they are ready, they will be asked to remove their underwear so PT can examine their perineum. The patient is also given the option of providing their own chaperone as one is not provided in our facility. The patient also has the right and is explained the right to defer or refuse any part of the evaluation or treatment including the internal exam. With the patient's consent, PT will use one gloved finger to gently assess the muscles of the pelvic floor, seeing how well it contracts and relaxes and if there is muscle symmetry. After, the patient will get dressed and PT and patient will discuss exam findings and plan of care. PT and patient discuss plan of care, schedule, attendance policy and HEP activities.             [facility] Adult PT Treatment/Exercise - 12/26/21 0001       Neuro Re-ed    Neuro Re-ed Details  Pelvic floor  contraction training with multi-modal cues to help improve posterior aspect of contraction - cue of holding in gas very helpful      Lumbar Exercises: Standing   Functional Squats 20 reps   with horizontal abduction and pelvic floor/core contraction 3 x 10, green band   Row Strengthening;Right;Left;20 reps    Theraband Level (Row) Level 3 (Green)    Shoulder Extension Strengthening;Right;Left;20 reps    Theraband Level (Shoulder Extension) Level 3 (Green)    Other Standing Lumbar Exercises Heel slams with pelvic floor contraction 2 x 10 for impac ttraining    Other Standing Lumbar Exercises Palof press 10x bil green band      Manual Therapy   Manual Therapy Internal Pelvic Floor    Internal Pelvic Floor pelvic floor contraction training; superficial anddeep pelvic floor release  with sustained pressure and gentle mobilization                     PT Education - 12/26/21 1704     Education Details Pt educatio nperformed on all exercise progressions.    Person(s) Educated Patient    Methods Explanation;Demonstration;Tactile cues;Verbal cues;Handout    Comprehension Verbalized understanding              PT Short Term Goals - 12/26/21 1703       PT SHORT TERM GOAL #1   Title Pt will be independent with HEP.    Time 4    Period Weeks    Status Achieved    Target Date 12/19/21      PT SHORT TERM GOAL #2   Title Pt will be independent with the knack and urge suppression technique in order to improve bladder habits and decrease urinary incontinence.    Time 4    Period Weeks    Status Achieved    Target Date 12/19/21      PT SHORT TERM GOAL #3   Title Pt will be able to correctly performing diaphragmatic breathing and appropriate pressure management in order to prevent worsening vaginal wall laxity and improve pelvic floor A/ROM.    Time 4    Status Achieved    Target Date 12/19/21               PT Long Term Goals - 12/26/21 1704       PT LONG TERM GOAL #1   Title Pt will be independent with advanced HEP.    Time 12    Period Weeks    Status Partially Met    Target Date 02/13/22      PT LONG TERM GOAL #2   Title Pt will report no leaks with laughing, coughing, sneezing, jumping, and exercise in order to improve comfort with interpersonal relationships and community activities.    Time 12    Period Weeks    Status Partially Met    Target Date 02/13/22      PT LONG TERM GOAL #3   Title Pt will demonstrate normal pelvic floor muscle tone and A/ROM, able to achieve 3/5 strength with contractions and 10 sec endurance, in order to provide appropriate lumbopelvic support in functional activities.    Time 12    Period Weeks    Status Partially Met    Target Date 02/13/22                   Plan - 12/26/21 1650      Clinical Impression Statement Pt is beginning to see improvements with  urinary incontinence during work outs. She still demonstrates notbale weakness with pelvic floor contraction, especially in posterior aspect of levator ani; verbal cues to contract like she is holding in gas helpful with improved strength with contraction. She did have residual pelvic floor tightness/tenderness, but it is improving and she demosntrated improvements today with manual techniques. She did very well with standing progressions  and heel slams to begin working on core/pelvic floor contraction with increased impact. She will continue to benefit from skilled PT intervention in order to decrease urinary incontinence and progress functional strengthening.    PT Treatment/Interventions ADLs/Self Care Home Management;Biofeedback;Cryotherapy;Electrical Stimulation;Moist Heat;Therapeutic activities;Therapeutic exercise;Neuromuscular re-education;Manual techniques;Patient/family education;Scar mobilization;Passive range of motion;Dry needling;Spinal Manipulations    PT Next Visit Plan Progress functional strengthening in standing; progress impact training with core/pelvic floor contraction; review down training for good pelvic floor relaxation at end of session.    PT Home Exercise Plan [id number]    Consulted and Agree with Plan of Care Patient             Patient will benefit from skilled therapeutic intervention in order to improve the following deficits and impairments:  Decreased coordination, Decreased range of motion, Increased fascial restricitons, Impaired tone, Decreased endurance, Increased muscle spasms, Pain, Decreased activity tolerance, Decreased scar mobility, Hypomobility, Decreased mobility, Decreased strength  Visit Diagnosis: Other muscle spasm  Muscle weakness (generalized)  Unspecified lack of coordination     Problem List Patient Active Problem List   Diagnosis Date Noted   Insulin resistance  11/19/2021   Polyphagia 07/11/2021   S/P thyroidectomy 01/31/2020   Migraine without aura 01/20/2017   Active labor 10/09/2013   Abnormal fetal ultrasound 12/15/2011   Small for dates affecting management of mother 12/15/2011   Abnormal genetic test in pregnancy 12/03/2011   Pregnancy, supervision of, high-risk 12/03/2011   HSV-2 infection complicating pregnancy 17/40/8144    Heather Roberts, PT, DPT03/01/235:05 PM   [facility] @ [address] [place], [place], [address] Phone: [phone]   Fax:  [phone]  Name: Devaney Segers MRN: [id number] Date of Birth: [birth year]

## 2022-01-02 ENCOUNTER — Ambulatory Visit: Payer: 59

## 2022-01-02 ENCOUNTER — Other Ambulatory Visit: Payer: Self-pay

## 2022-01-02 DIAGNOSIS — M6281 Muscle weakness (generalized): Secondary | ICD-10-CM

## 2022-01-02 DIAGNOSIS — M62838 Other muscle spasm: Secondary | ICD-10-CM

## 2022-01-02 NOTE — Therapy (Signed)
[facility] ?[facility] [place] ?[place], [place], [address] ?Phone: [phone]   Fax:  [phone] ? ?Physical Therapy Treatment ? ?Patient Details  ?Name: Leah Schneider ?MRN: [id number] ?Date of Birth: 1986-01-01 ?No data recorded ? ?Encounter Date: 01/02/2022 ? ? PT End of Session - 01/02/22 1619   ? ? Visit Number 5   ? Date for PT Re-Evaluation 02/13/22   ? Authorization Type Aetna   ? PT Start Time [phone]   ? PT Stop Time 3007   ? PT Time Calculation (min) 38 min   ? Activity Tolerance Patient tolerated treatment well   ? Behavior During Therapy [facility] for tasks assessed/performed   ? ?  ?  ? ?  ? ? ?Past Medical History:  ?Diagnosis Date  ? Back pain   ? Constipation   ? Edema of both lower legs   ? Headache   ? Herpes   ? IBS (irritable bowel syndrome)   ? Memory change   ? No pertinent past medical history   ? Thyroid condition   ? ? ?Past Surgical History:  ?Procedure Laterality Date  ? CESAREAN SECTION    ? CESAREAN SECTION  12/19/2011  ? Procedure: CESAREAN SECTION;  Surgeon: Jonnie Kind, MD;  Location: [facility];  Service: Gynecology;  Laterality: N/A;  ? EYE SURGERY    ? per patient "laser treatment to boyh eyes about 2-3 years ago in the summer"  ? THYROIDECTOMY Left 01/31/2020  ? Procedure: LEFT THYROID LOBECTOMY WITH FROZEN SECTION;  Surgeon: Izora Gala, MD;  Location: [facility];  Service: ENT;  Laterality: Left;  ? ? ?There were no vitals filed for this visit. ? ? Subjective Assessment - 01/02/22 1619   ? ? Subjective Pt states that she continues to make progress and is noticing less leaking with jumping activities, but still present. Jumping jacks and scissor jumps are the most diffiuclt activities.   ? Patient Stated Goals Decrease urinary incontinence   ? Currently in Pain? No/denies   ? Multiple Pain Sites No   ? ?  ?  ? ?  ? ? ? ? ? ? ? ? ? ? ? ? ? ? ? ? ? ? ? ? [facility] Adult PT Treatment/Exercise - 01/02/22 0001   ? ?  ? Self-Care  ? Self-Care Other  Self-Care Comments   Importance of good sleep hygiene and getting more rest in order to see improvements with weight loss and pelvic floor dysfunction  ?  ? Lumbar Exercises: Standing  ? Functional Squats 20 reps   15lbs  ? Other Standing Lumbar Exercises Sumo squat 10x, sumo squat with heel raise/slam 15lbs 10x; jump squats 2 x 10 with pelvic floor contraction; sumo jump squats/lift pulse 2 x 10 15lbs; scissor jumps with pelvic floor activation; penguin jumps and different foot width 2 x 10   ? ?  ?  ? ?  ? ? ? ? ? ? ? ? ? ? PT Education - 01/02/22 1628   ? ? Education Details Pt education performed on all exercise progressions.   ? Person(s) Educated Patient   ? Methods Explanation;Demonstration;Tactile cues;Verbal cues;Handout   ? Comprehension Verbalized understanding   ? ?  ?  ? ?  ? ? ? PT Short Term Goals - 12/26/21 1703   ? ?  ? PT SHORT TERM GOAL #1  ? Title Pt will be independent with HEP.   ? Time 4   ? Period  Weeks   ? Status Achieved   ? Target Date 12/19/21   ?  ? PT SHORT TERM GOAL #2  ? Title Pt will be independent with the knack and urge suppression technique in order to improve bladder habits and decrease urinary incontinence.   ? Time 4   ? Period Weeks   ? Status Achieved   ? Target Date 12/19/21   ?  ? PT SHORT TERM GOAL #3  ? Title Pt will be able to correctly performing diaphragmatic breathing and appropriate pressure management in order to prevent worsening vaginal wall laxity and improve pelvic floor A/ROM.   ? Time 4   ? Status Achieved   ? Target Date 12/19/21   ? ?  ?  ? ?  ? ? ? ? PT Long Term Goals - 12/26/21 1704   ? ?  ? PT LONG TERM GOAL #1  ? Title Pt will be independent with advanced HEP.   ? Time 12   ? Period Weeks   ? Status Partially Met   ? Target Date 02/13/22   ?  ? PT LONG TERM GOAL #2  ? Title Pt will report no leaks with laughing, coughing, sneezing, jumping, and exercise in order to improve comfort with interpersonal relationships and community activities.   ? Time 12    ? Period Weeks   ? Status Partially Met   ? Target Date 02/13/22   ?  ? PT LONG TERM GOAL #3  ? Title Pt will demonstrate normal pelvic floor muscle tone and A/ROM, able to achieve 3/5 strength with contractions and 10 sec endurance, in order to provide appropriate lumbopelvic support in functional activities.   ? Time 12   ? Period Weeks   ? Status Partially Met   ? Target Date 02/13/22   ? ?  ?  ? ?  ? ? ? ? ? ? ? ? Plan - 01/02/22 1629   ? ? Clinical Impression Statement Pt is overall making good progress with urinary incontinence and major issue remaining is leaking with jumping during workouts. She did well focusing on pelvic floor/core contraction during more functional strengthening, such as lunges and sumo squats, today to help prepare pelvic floor for activation during greater impact. She was educated that we are trying to get pelvic floor to contract in more anticipatory manner. She was able to perform several jumping activities with specific focus on pelvic floor/core activation with only one leak when penguin jumps got too wide, demonstrating great progress and increasing confidence. We discussed at length importance of good sleep hygiene. She will continue to benefit from skilled PT intervention in order to decrease urinary incontinence and progress functional strengthening.   ? PT Treatment/Interventions ADLs/Self Care Home Management;Biofeedback;Cryotherapy;Electrical Stimulation;Moist Heat;Therapeutic activities;Therapeutic exercise;Neuromuscular re-education;Manual techniques;Patient/family education;Scar mobilization;Passive range of motion;Dry needling;Spinal Manipulations   ? PT Next Visit Plan Progress functional strengthening in standing; progress impact training with core/pelvic floor contraction; review down training for good pelvic floor relaxation at end of session.   ? PT [place]   ? Consulted and Agree with Plan of Care Patient   ? ?  ?  ? ?  ? ? ?Patient will benefit  from skilled therapeutic intervention in order to improve the following deficits and impairments:  Decreased coordination, Decreased range of motion, Increased fascial restricitons, Impaired tone, Decreased endurance, Increased muscle spasms, Pain, Decreased activity tolerance, Decreased scar mobility, Hypomobility, Decreased mobility, Decreased strength ? ?Visit Diagnosis: ?Other muscle spasm ? ?  Muscle weakness (generalized) ? ? ? ? ?Problem List ?Patient Active Problem List  ? Diagnosis Date Noted  ? Insulin resistance 11/19/2021  ? Polyphagia 07/11/2021  ? S/P thyroidectomy 01/31/2020  ? Migraine without aura 01/20/2017  ? Active labor 10/09/2013  ? Abnormal fetal ultrasound 12/15/2011  ? Small for dates affecting management of mother 12/15/2011  ? Abnormal genetic test in pregnancy 12/03/2011  ? Pregnancy, supervision of, high-risk 12/03/2011  ? HSV-2 infection complicating pregnancy 90/93/1121  ? ? ?Heather Roberts, PT, DPT03/08/234:57 PM ? ? ?[facility] ?[facility] [place] ?[place], [place], [address] ?Phone: [phone]   Fax:  [phone] ? ?Name: Leah Schneider ?MRN: [id number] ?Date of Birth: [birth year] ? ? ? ?

## 2022-01-03 ENCOUNTER — Encounter (INDEPENDENT_AMBULATORY_CARE_PROVIDER_SITE_OTHER): Payer: Self-pay | Admitting: Family Medicine

## 2022-01-03 ENCOUNTER — Ambulatory Visit (INDEPENDENT_AMBULATORY_CARE_PROVIDER_SITE_OTHER): Payer: 59 | Admitting: Family Medicine

## 2022-01-03 VITALS — BP 117/76 | HR 76 | Temp 98.0°F | Ht 65.0 in | Wt 198.0 lb

## 2022-01-03 DIAGNOSIS — E669 Obesity, unspecified: Secondary | ICD-10-CM | POA: Diagnosis not present

## 2022-01-03 DIAGNOSIS — Z6833 Body mass index (BMI) 33.0-33.9, adult: Secondary | ICD-10-CM

## 2022-01-03 DIAGNOSIS — R7303 Prediabetes: Secondary | ICD-10-CM

## 2022-01-03 DIAGNOSIS — E8881 Metabolic syndrome: Secondary | ICD-10-CM

## 2022-01-03 DIAGNOSIS — F3289 Other specified depressive episodes: Secondary | ICD-10-CM

## 2022-01-03 DIAGNOSIS — Z9189 Other specified personal risk factors, not elsewhere classified: Secondary | ICD-10-CM

## 2022-01-03 MED ORDER — METFORMIN HCL 500 MG PO TABS: ORAL_TABLET | ORAL | 0 refills | Status: DC

## 2022-01-03 MED ORDER — BUPROPION HCL ER (SR) 150 MG PO TB12: 150.0000 mg | ORAL_TABLET | Freq: Every day | ORAL | 0 refills | Status: DC

## 2022-01-07 NOTE — Progress Notes (Signed)
? ? ? ?Chief Complaint:  ? ?OBESITY ?Munira is here to discuss her progress with her obesity treatment plan along with follow-up of her obesity related diagnoses. Rayette is on the Category 2 Plan and keeping a food journal and adhering to recommended goals of 400-500 calories and 40+ grams of protein at supper daily and states she is following her eating plan approximately 85% of the time. Alli states she is exercising and walking for 30 minutes 6 times per week. ? ?Today's visit was #: 13 ?Starting weight: 241 lbs ?Starting date: 05/08/2021 ?Today's weight: 198 lbs ?Today's date: 01/03/2022 ?Total lbs lost to date: 59 ?Total lbs lost since last in-office visit: 2 ? ?Interim History: Cecia continues to do well with weight loss. She has had some dietary exceptions. She notes some sugar cravings but she is able to portion control. ? ?Subjective:  ? ?1. Pre-diabetes ?Solveig is working on diet, exercise, and metformin. She has had some sugar cravings recently. ? ?2. Other depression with emotional eating ?Mayce notes increased stress and stress eating. ? ?3. At risk of diabetes mellitus ?Keven is at higher than average risk for developing diabetes due to her obesity. ? ?Assessment/Plan:  ? ?1. Pre-diabetes ?We will refill metformin for 1 month. Zully will continue to work on weight loss, exercise, and decreasing simple carbohydrates to help decrease the risk of diabetes.  ? ?- metFORMIN (GLUCOPHAGE) 500 MG tablet; TAKE 1 TABLET BY MOUTH TWICE DAILY WITH A MEAL VISIT  NEEDED  FOR  REFILLS Strength: 500 mg  Dispense: 60 tablet; Refill: 0 ? ?2. Other depression with emotional eating ?Airis agreed to start Wellbutrin SR 150 mg q AM with no refills. Behavior modification techniques were discussed today to help Orpah deal with her emotional/non-hunger eating behaviors. Orders and follow up as documented in patient record.  ? ?- buPROPion (WELLBUTRIN SR) 150 MG 12 hr tablet; Take 1 tablet (150 mg total) by  mouth daily.  Dispense: 30 tablet; Refill: 0 ? ?3. At risk of diabetes mellitus ?Daria was given approximately 15 minutes of diabetic education and counseling today. We discussed intensive lifestyle modifications today with an emphasis on weight loss as well as increasing exercise and decreasing simple carbohydrates in her diet. We also reviewed medication options with an emphasis on risk versus benefits of those discussed. ? ?Repetitive spaced learning was employed today to elicit superior memory formation and behavioral change. ? ?4. Obesity, current BMI 33.0 ?Donnetta is currently in the action stage of change. As such, her goal is to continue with weight loss efforts. She has agreed to the Category 2 Plan and keeping a food journal and adhering to recommended goals of 400-550 calories and 40+ grams of protein at supper daily.  ? ?Exercise goals: As is. ? ?Behavioral modification strategies: increasing lean protein intake and emotional eating strategies. ? ?Esti has agreed to follow-up with our clinic in 4 weeks. She was informed of the importance of frequent follow-up visits to maximize her success with intensive lifestyle modifications for her multiple health conditions.  ? ?Objective:  ? ?Blood pressure 117/76, pulse 76, temperature 98 ?F (36.7 ?C), height 5\' 5"  (1.651 m), weight 198 lb (89.8 kg), SpO2 98 %, unknown if currently breastfeeding. ?Body mass index is 32.95 kg/m?. ? ?General: Cooperative, alert, well developed, in no acute distress. ?HEENT: Conjunctivae and lids unremarkable. ?Cardiovascular: Regular rhythm.  ?Lungs: Normal work of breathing. ?Neurologic: No focal deficits.  ? ?Lab Results  ?Component Value Date  ? CREATININE 0.66 10/18/2021  ?  BUN 13 10/18/2021  ? NA 140 10/18/2021  ? K 4.2 10/18/2021  ? CL 103 10/18/2021  ? CO2 24 10/18/2021  ? ?Lab Results  ?Component Value Date  ? ALT 12 10/18/2021  ? AST 16 10/18/2021  ? ALKPHOS 101 10/18/2021  ? BILITOT 0.4 10/18/2021  ? ?Lab Results   ?Component Value Date  ? HGBA1C 5.1 10/18/2021  ? HGBA1C 5.3 05/08/2021  ? ?Lab Results  ?Component Value Date  ? INSULIN 6.8 10/18/2021  ? INSULIN 21.1 05/08/2021  ? ?Lab Results  ?Component Value Date  ? TSH 2.060 05/08/2021  ? ?Lab Results  ?Component Value Date  ? CHOL 131 10/18/2021  ? HDL 41 10/18/2021  ? LDLCALC 74 10/18/2021  ? TRIG 79 10/18/2021  ? ?Lab Results  ?Component Value Date  ? VD25OH 47.1 10/18/2021  ? VD25OH 60.1 05/08/2021  ? ?Lab Results  ?Component Value Date  ? WBC 7.2 05/08/2021  ? HGB 15.1 05/08/2021  ? HCT 45.9 05/08/2021  ? MCV 88 05/08/2021  ? PLT 243 05/08/2021  ? ?No results found for: IRON, TIBC, FERRITIN ? ?Attestation Statements:  ? ?Reviewed by clinician on day of visit: allergies, medications, problem list, medical history, surgical history, family history, social history, and previous encounter notes. ? ? ?I, Burt Knack, am acting as transcriptionist for Quillian Quince, MD. ? ?I have reviewed the above documentation for accuracy and completeness, and I agree with the above. -  Quillian Quince, MD ? ? ?

## 2022-01-08 ENCOUNTER — Other Ambulatory Visit: Payer: Self-pay

## 2022-01-08 ENCOUNTER — Ambulatory Visit: Payer: 59

## 2022-01-08 DIAGNOSIS — M62838 Other muscle spasm: Secondary | ICD-10-CM | POA: Diagnosis not present

## 2022-01-08 DIAGNOSIS — R279 Unspecified lack of coordination: Secondary | ICD-10-CM

## 2022-01-08 DIAGNOSIS — M6281 Muscle weakness (generalized): Secondary | ICD-10-CM

## 2022-01-08 NOTE — Therapy (Signed)
?[facility] ?[place], [place], [address] ?Phone: [phone]   Fax:  [phone] ? ?Physical Therapy Treatment ? ?Patient Details  ?Name: Leah Schneider ?MRN: [id number] ?Date of Birth: [birth year] ?No data recorded ? ?Encounter Date: 01/08/2022 ? ? PT End of Session - 01/08/22 1540   ? ? Visit Number 6   ? Date for PT Re-Evaluation 02/13/22   ? Authorization Type Aetna   ? PT Start Time 2423   ? PT Stop Time 1623   ? PT Time Calculation (min) 40 min   ? Activity Tolerance Patient tolerated treatment well   ? Behavior During Therapy [facility] for tasks assessed/performed   ? ?  ?  ? ?  ? ? ?Past Medical History:  ?Diagnosis Date  ? Back pain   ? Constipation   ? Edema of both lower legs   ? Headache   ? Herpes   ? IBS (irritable bowel syndrome)   ? Memory change   ? No pertinent past medical history   ? Thyroid condition   ? ? ?Past Surgical History:  ?Procedure Laterality Date  ? CESAREAN SECTION    ? CESAREAN SECTION  12/19/2011  ? Procedure: CESAREAN SECTION;  Surgeon: Jonnie Kind, MD;  Location: [facility];  Service: Gynecology;  Laterality: N/A;  ? EYE SURGERY    ? per patient "laser treatment to boyh eyes about 2-3 years ago in the summer"  ? THYROIDECTOMY Left 01/31/2020  ? Procedure: LEFT THYROID LOBECTOMY WITH FROZEN SECTION;  Surgeon: Izora Gala, MD;  Location: [facility];  Service: ENT;  Laterality: Left;  ? ? ?There were no vitals filed for this visit. ? ? Subjective Assessment - 01/08/22 1544   ? ? Subjective Pt states that she did not perform pelvic floor specific exercises for several days and feels like she noticed a big difference Monday when doing exercises with much more leaking.   ? Patient Stated Goals Decrease urinary incontinence   ? Currently in Pain? No/denies   ? Multiple Pain Sites No   ? ?  ?  ? ?  ? ? ? ? ? ? ? ? ? ? ? ? ? ? ? ? ? ? ? ? [place] Adult PT Treatment/Exercise - 01/08/22 0001   ? ?  ? Lumbar Exercises: Standing  ? Functional  Squats 20 reps   gentle jump squats on trampoline  ? Other Standing Lumbar Exercises Heel slams with pelvic floor contraction 2 x 10 for impact training; cross body over head press with single leg impact 10x bil 8lbs; lateral bounding 2 x 10; skiers 2 x 10   ? Other Standing Lumbar Exercises Suma squat/squats body weight 10x each; sumo squat/jumps 15 lbs 2 x 10; trampoline staggered stance 20x each, lateral weight shift 20x, wide stance bouncing 2 x 20   ?  ? Lumbar Exercises: Supine  ? Other Supine Lumbar Exercises Happy baby 10 breaths; butterfly 10 breaths   ?  ? Lumbar Exercises: Quadruped  ? Madcat/Old Horse 20 reps   ? Other Quadruped Lumbar Exercises Child's pose 10 breaths   ? ?  ?  ? ?  ? ? ? ? ? ? ? ? ? ? PT Education - 01/08/22 1546   ? ? Education Details Pt education performed that rest days are good and even encouraged; increase in leaking Mnday morning with exercises most likely not due to a couple of rest days.   ? Person(s)  Educated Patient   ? Methods Explanation;Demonstration;Tactile cues;Verbal cues   ? Comprehension Verbalized understanding   ? ?  ?  ? ?  ? ? ? PT Short Term Goals - 12/26/21 1703   ? ?  ? PT SHORT TERM GOAL #1  ? Title Pt will be independent with HEP.   ? Time 4   ? Period Weeks   ? Status Achieved   ? Target Date 12/19/21   ?  ? PT SHORT TERM GOAL #2  ? Title Pt will be independent with the knack and urge suppression technique in order to improve bladder habits and decrease urinary incontinence.   ? Time 4   ? Period Weeks   ? Status Achieved   ? Target Date 12/19/21   ?  ? PT SHORT TERM GOAL #3  ? Title Pt will be able to correctly performing diaphragmatic breathing and appropriate pressure management in order to prevent worsening vaginal wall laxity and improve pelvic floor A/ROM.   ? Time 4   ? Status Achieved   ? Target Date 12/19/21   ? ?  ?  ? ?  ? ? ? ? PT Long Term Goals - 12/26/21 1704   ? ?  ? PT LONG TERM GOAL #1  ? Title Pt will be independent with advanced HEP.   ?  Time 12   ? Period Weeks   ? Status Partially Met   ? Target Date 02/13/22   ?  ? PT LONG TERM GOAL #2  ? Title Pt will report no leaks with laughing, coughing, sneezing, jumping, and exercise in order to improve comfort with interpersonal relationships and community activities.   ? Time 12   ? Period Weeks   ? Status Partially Met   ? Target Date 02/13/22   ?  ? PT LONG TERM GOAL #3  ? Title Pt will demonstrate normal pelvic floor muscle tone and A/ROM, able to achieve 3/5 strength with contractions and 10 sec endurance, in order to provide appropriate lumbopelvic support in functional activities.   ? Time 12   ? Period Weeks   ? Status Partially Met   ? Target Date 02/13/22   ? ?  ?  ? ?  ? ? ? ? ? ? ? ? Plan - 01/08/22 1547   ? ? Clinical Impression Statement Pt had little setback over the last week that coupld be due to number of factors that may not have anything due to taking a couple of active rest days. She was able to progress to trampoline activities today without any leaking, but she reported some sense of urency with wide stance bouncing - improved with active pelvic floor contraction. Good tolerance to all other impact and coordination activities demonstrated by no report of urgency or leaking. Believe even with setback, she is making very good progress overall. She was encouragedto actively contract pelvic floor when she feels like she is having more diffiuclty. She will continue to benefit from skilled PT intervention in order to decrease urinary incontinence and progress functional strengthening.   ? PT Treatment/Interventions ADLs/Self Care Home Management;Biofeedback;Cryotherapy;Electrical Stimulation;Moist Heat;Therapeutic activities;Therapeutic exercise;Neuromuscular re-education;Manual techniques;Patient/family education;Scar mobilization;Passive range of motion;Dry needling;Spinal Manipulations   ? PT Next Visit Plan Progress functional strengthening in standing; progress impact training with  core/pelvic floor contraction; review down training for good pelvic floor relaxation at end of session.   ? PT [place]   ? Consulted and Agree with Plan of Care Patient   ? ?  ?  ? ?  ? ? ?  Patient will benefit from skilled therapeutic intervention in order to improve the following deficits and impairments:  Decreased coordination, Decreased range of motion, Increased fascial restricitons, Impaired tone, Decreased endurance, Increased muscle spasms, Pain, Decreased activity tolerance, Decreased scar mobility, Hypomobility, Decreased mobility, Decreased strength ? ?Visit Diagnosis: ?Other muscle spasm ? ?Muscle weakness (generalized) ? ?Unspecified lack of coordination ? ? ? ? ?Problem List ?Patient Active Problem List  ? Diagnosis Date Noted  ? Insulin resistance 11/19/2021  ? Polyphagia 07/11/2021  ? S/P thyroidectomy 01/31/2020  ? Migraine without aura 01/20/2017  ? Active labor 10/09/2013  ? Abnormal fetal ultrasound 12/15/2011  ? Small for dates affecting management of mother 12/15/2011  ? Abnormal genetic test in pregnancy 12/03/2011  ? Pregnancy, supervision of, high-risk 12/03/2011  ? HSV-2 infection complicating pregnancy 92/76/3943  ? ? ?Leah Schneider, PT, DPT03/14/234:26 PM ? ? ?[facility] ?[place], [place], [address] ?Phone: [phone]   Fax:  [phone] ? ?Name: Leah Schneider ?MRN: [id number] ?Date of Birth: 1986-08-09 ? ? ? ?

## 2022-01-29 ENCOUNTER — Ambulatory Visit (INDEPENDENT_AMBULATORY_CARE_PROVIDER_SITE_OTHER): Payer: 59 | Admitting: Family Medicine

## 2022-01-29 ENCOUNTER — Ambulatory Visit: Payer: 59 | Attending: Obstetrics and Gynecology

## 2022-01-29 DIAGNOSIS — R279 Unspecified lack of coordination: Secondary | ICD-10-CM | POA: Insufficient documentation

## 2022-01-29 DIAGNOSIS — M62838 Other muscle spasm: Secondary | ICD-10-CM | POA: Insufficient documentation

## 2022-01-29 DIAGNOSIS — M6281 Muscle weakness (generalized): Secondary | ICD-10-CM | POA: Insufficient documentation

## 2022-01-29 NOTE — Therapy (Signed)
[facility] ?[place], [place], [address] ?Phone: [phone]   Fax:  [phone] ? ?Physical Therapy Treatment ? ?Patient Details  ?Name: Environmental education officer ?MRN: [id number] ?Date of Birth: October 09, 1986 ?No data recorded ? ?Encounter Date: 01/29/2022 ? ? PT End of Session - 01/29/22 0758   ? ? Visit Number 7   ? Date for PT Re-Evaluation 02/13/22   ? Authorization Type Aetna   ? PT Start Time 0800   ? PT Stop Time 0840   ? PT Time Calculation (min) 40 min   ? Activity Tolerance Patient tolerated treatment well   ? Behavior During Therapy [facility] for tasks assessed/performed   ? ?  ?  ? ?  ? ? ?Past Medical History:  ?Diagnosis Date  ? Back pain   ? Constipation   ? Edema of both lower legs   ? Headache   ? Herpes   ? IBS (irritable bowel syndrome)   ? Memory change   ? No pertinent past medical history   ? Thyroid condition   ? ? ?Past Surgical History:  ?Procedure Laterality Date  ? CESAREAN SECTION    ? CESAREAN SECTION  12/19/2011  ? Procedure: CESAREAN SECTION;  Surgeon: Jonnie Kind, MD;  Location: [facility];  Service: Gynecology;  Laterality: N/A;  ? EYE SURGERY    ? per patient "laser treatment to boyh eyes about 2-3 years ago in the summer"  ? THYROIDECTOMY Left 01/31/2020  ? Procedure: LEFT THYROID LOBECTOMY WITH FROZEN SECTION;  Surgeon: Izora Gala, MD;  Location: [facility];  Service: ENT;  Laterality: Left;  ? ? ?There were no vitals filed for this visit. ? ? Subjective Assessment - 01/29/22 0759   ? ? Subjective Pt states that she felt like she was making some improvements, but the last couple days preparing for menstrual cycle she has noticed. She is getting in exercises well throughout the day.   ? Patient Stated Goals Decrease urinary incontinence   ? Currently in Pain? No/denies   ? Multiple Pain Sites No   ? ?  ?  ? ?  ? ? ? ? ? ? ? ? ? ? ? ? ? ? ? ? ? ? ? ? [place] Adult PT Treatment/Exercise - 01/29/22 0001   ? ?  ? Self-Care  ? Self-Care Other  Self-Care Comments   Pelvic floor muscle wand, poise impressa, pessaries, importance of pressure management, and prolapse anatomy/impact on leaking  ?  ? Neuro Re-ed   ? Neuro Re-ed Details  Pelvic floor contraction training with internal feedback and verbal cues   ?  ? Manual Therapy  ? Manual Therapy Internal Pelvic Floor   ? Internal Pelvic Floor deep pelvic floor muscle release bil   ? ?  ?  ? ?  ? ? ? ? ? ? ? ? ? ? PT Education - 01/29/22 0806   ? ? Education Details Pelvic floor muscle wand, poise impressa, pessaries, importance of pressure management, and prolapse anatomy/impact on leaking.   ? Person(s) Educated Patient   ? Methods Explanation;Demonstration;Tactile cues;Verbal cues;Handout   ? Comprehension Verbalized understanding   ? ?  ?  ? ?  ? ? ? PT Short Term Goals - 01/29/22 0808   ? ?  ? PT SHORT TERM GOAL #1  ? Title Pt will be independent with HEP.   ? Time 4   ? Period Weeks   ? Status Achieved   ?  Target Date 12/19/21   ?  ? PT SHORT TERM GOAL #2  ? Title Pt will be independent with the knack and urge suppression technique in order to improve bladder habits and decrease urinary incontinence.   ? Time 4   ? Period Weeks   ? Status Achieved   ? Target Date 12/19/21   ?  ? PT SHORT TERM GOAL #3  ? Title Pt will be able to correctly performing diaphragmatic breathing and appropriate pressure management in order to prevent worsening vaginal wall laxity and improve pelvic floor A/ROM.   ? Time 4   ? Status Achieved   ? Target Date 12/19/21   ? ?  ?  ? ?  ? ? ? ? PT Long Term Goals - 01/29/22 0808   ? ?  ? PT LONG TERM GOAL #1  ? Title Pt will be independent with advanced HEP.   ? Time 12   ? Period Weeks   ? Status Partially Met   ? Target Date 02/13/22   ?  ? PT LONG TERM GOAL #2  ? Title Pt will report no leaks with laughing, coughing, sneezing, jumping, and exercise in order to improve comfort with interpersonal relationships and community activities.   ? Time 12   ? Period Weeks   ? Status  Partially Met   ? Target Date 02/13/22   ?  ? PT LONG TERM GOAL #3  ? Title Pt will demonstrate normal pelvic floor muscle tone and A/ROM, able to achieve 3/5 strength with contractions and 10 sec endurance, in order to provide appropriate lumbopelvic support in functional activities.   ? Time 12   ? Period Weeks   ? Status Partially Met   ? Target Date 02/13/22   ? ?  ?  ? ?  ? ? ? ? ? ? ? ? Plan - 01/29/22 0806   ? ? Clinical Impression Statement Due to minimal progress over the last 3 weeks and still large amounts of leaking surrounding menstrual cycle, return to pelvic floor exam to observe what muscular tension was doing. Pt does still demonstrate tension in Bil superficial and deep pelvic floor, but improved from last exam. There are also improvements in her motor control of contraction, but she needed verbal cues to help facilitate the better contraction, possibly indicating that the strongthening she is doing on her own is not as beneficial as it could be. Believe that the grade 2 anterior vaignal wall laxity may be a significnat contributor to symptoms at this point as well and we discussed poise impressa and pessaries as options if we conitnue to see limited progress. In addition, pelvic floor muscle wand presented as option to assist with tension on her own - pt agreeable to get. She will continue to benefit from skilled PT intervention in order to decrease urinary incontinence and progress functional strengthening.   ? PT Treatment/Interventions ADLs/Self Care Home Management;Biofeedback;Cryotherapy;Electrical Stimulation;Moist Heat;Therapeutic activities;Therapeutic exercise;Neuromuscular re-education;Manual techniques;Patient/family education;Scar mobilization;Passive range of motion;Dry needling;Spinal Manipulations   ? PT Next Visit Plan Progress functional strengthening in standing; progress impact training with core/pelvic floor contraction; review down training for good pelvic floor relaxation at  end of session.   ? PT [place]   ? Consulted and Agree with Plan of Care Patient   ? ?  ?  ? ?  ? ? ?Patient will benefit from skilled therapeutic intervention in order to improve the following deficits and impairments:  Decreased coordination, Decreased  range of motion, Increased fascial restricitons, Impaired tone, Decreased endurance, Increased muscle spasms, Pain, Decreased activity tolerance, Decreased scar mobility, Hypomobility, Decreased mobility, Decreased strength ? ?Visit Diagnosis: ?Other muscle spasm ? ?Muscle weakness (generalized) ? ?Unspecified lack of coordination ? ? ? ? ?Problem List ?Patient Active Problem List  ? Diagnosis Date Noted  ? Insulin resistance 11/19/2021  ? Polyphagia 07/11/2021  ? S/P thyroidectomy 01/31/2020  ? Migraine without aura 01/20/2017  ? Active labor 10/09/2013  ? Abnormal fetal ultrasound 12/15/2011  ? Small for dates affecting management of mother 12/15/2011  ? Abnormal genetic test in pregnancy 12/03/2011  ? Pregnancy, supervision of, high-risk 12/03/2011  ? HSV-2 infection complicating pregnancy 13/64/3837  ? ? ?Heather Roberts, PT, DPT04/04/238:46 AM ? ? ?[facility] ?[facility] ?[place], [place], [address] ?Phone: [phone]   Fax:  [phone] ? ?Name: Environmental education officer ?MRN: [id number] ?Date of Birth: 10/28/1986 ? ? ? ?

## 2022-01-30 ENCOUNTER — Other Ambulatory Visit (INDEPENDENT_AMBULATORY_CARE_PROVIDER_SITE_OTHER): Payer: Self-pay | Admitting: Family Medicine

## 2022-01-30 DIAGNOSIS — F3289 Other specified depressive episodes: Secondary | ICD-10-CM

## 2022-01-30 DIAGNOSIS — R7303 Prediabetes: Secondary | ICD-10-CM

## 2022-01-30 MED ORDER — BUPROPION HCL ER (SR) 150 MG PO TB12: 150.0000 mg | ORAL_TABLET | Freq: Every day | ORAL | 0 refills | Status: DC

## 2022-01-30 MED ORDER — METFORMIN HCL 500 MG PO TABS: ORAL_TABLET | ORAL | 0 refills | Status: DC

## 2022-01-30 NOTE — Telephone Encounter (Signed)
LAST APPOINTMENT DATE: 01/03/2022 ?NEXT APPOINTMENT DATE: 02/25/2022 ? ? ?[place] [address] ?Phone: [phone] Fax: [phone] ? ?[facility] [place] [place] [facility] [address] ?[place] ?[place] [address] ?Phone: [phone] Fax: [phone] ? ?Patient is requesting a refill of the following medications: ?Requested Prescriptions  ? ?Pending Prescriptions Disp Refills  ? metFORMIN (GLUCOPHAGE) 500 MG tablet 60 tablet 0  ?  Sig: TAKE 1 TABLET BY MOUTH TWICE DAILY WITH A MEAL VISIT  NEEDED  FOR  REFILLS Strength: 500 mg  ? buPROPion (WELLBUTRIN SR) 150 MG 12 hr tablet 30 tablet 0  ?  Sig: Take 1 tablet (150 mg total) by mouth daily.  ? ? ?Date last filled: 01/03/2022 ?Previously prescribed by Dr. Leafy Ro ? ?Lab Results  ?Component Value Date  ? HGBA1C 5.1 10/18/2021  ? HGBA1C 5.3 05/08/2021  ? ?Lab Results  ?Component Value Date  ? [place] 74 10/18/2021  ? CREATININE 0.66 10/18/2021  ? ?Lab Results  ?Component Value Date  ? VD25OH 47.1 10/18/2021  ? VD25OH 60.1 05/08/2021  ? ? ?BP Readings from Last 3 Encounters:  ?01/03/22 117/76  ?12/13/21 105/67  ?11/19/21 109/72  ? ? ?

## 2022-02-12 ENCOUNTER — Ambulatory Visit: Payer: 59

## 2022-02-21 ENCOUNTER — Ambulatory Visit (INDEPENDENT_AMBULATORY_CARE_PROVIDER_SITE_OTHER): Payer: 59 | Admitting: Family Medicine

## 2022-02-25 ENCOUNTER — Encounter (INDEPENDENT_AMBULATORY_CARE_PROVIDER_SITE_OTHER): Payer: Self-pay | Admitting: Family Medicine

## 2022-02-25 ENCOUNTER — Ambulatory Visit (INDEPENDENT_AMBULATORY_CARE_PROVIDER_SITE_OTHER): Payer: 59 | Admitting: Family Medicine

## 2022-02-25 VITALS — BP 110/74 | HR 84 | Temp 97.9°F | Ht 65.0 in | Wt 184.0 lb

## 2022-02-25 DIAGNOSIS — E669 Obesity, unspecified: Secondary | ICD-10-CM | POA: Diagnosis not present

## 2022-02-25 DIAGNOSIS — E8881 Metabolic syndrome: Secondary | ICD-10-CM | POA: Diagnosis not present

## 2022-02-25 DIAGNOSIS — Z6830 Body mass index (BMI) 30.0-30.9, adult: Secondary | ICD-10-CM | POA: Diagnosis not present

## 2022-02-25 DIAGNOSIS — F3289 Other specified depressive episodes: Secondary | ICD-10-CM | POA: Diagnosis not present

## 2022-02-25 DIAGNOSIS — Z9189 Other specified personal risk factors, not elsewhere classified: Secondary | ICD-10-CM

## 2022-02-25 MED ORDER — BUPROPION HCL ER (SR) 200 MG PO TB12: 200.0000 mg | ORAL_TABLET | Freq: Every day | ORAL | 0 refills | Status: DC

## 2022-02-25 MED ORDER — METFORMIN HCL 500 MG PO TABS: ORAL_TABLET | ORAL | 0 refills | Status: DC

## 2022-02-26 ENCOUNTER — Ambulatory Visit: Payer: 59 | Attending: Obstetrics and Gynecology

## 2022-02-26 DIAGNOSIS — M6281 Muscle weakness (generalized): Secondary | ICD-10-CM | POA: Diagnosis present

## 2022-02-26 DIAGNOSIS — M62838 Other muscle spasm: Secondary | ICD-10-CM | POA: Diagnosis not present

## 2022-02-26 DIAGNOSIS — R279 Unspecified lack of coordination: Secondary | ICD-10-CM | POA: Insufficient documentation

## 2022-02-26 NOTE — Therapy (Signed)
?OUTPATIENT PHYSICAL THERAPY FEMALE PELVIC EVALUATION ? ? ?Patient Name: Environmental education officer ?MRN: [id number] ?DOB:04/22/1986, 36 y.o., female ?Today's Date: 02/26/2022 ? ? PT End of Session - 02/26/22 1614   ? ? Visit Number 8   ? Date for PT Re-Evaluation 05/21/22   ? Authorization Type Aetna   ? PT Start Time 1615   ? PT Stop Time 1655   ? PT Time Calculation (min) 40 min   ? Activity Tolerance Patient tolerated treatment well   ? Behavior During Therapy [facility] for tasks assessed/performed   ? ?  ?  ? ?  ? ? ?Past Medical History:  ?Diagnosis Date  ? Back pain   ? Constipation   ? Edema of both lower legs   ? Headache   ? Herpes   ? IBS (irritable bowel syndrome)   ? Memory change   ? No pertinent past medical history   ? Thyroid condition   ? ?Past Surgical History:  ?Procedure Laterality Date  ? CESAREAN SECTION    ? CESAREAN SECTION  12/19/2011  ? Procedure: CESAREAN SECTION;  Surgeon: Jonnie Kind, MD;  Location: [facility];  Service: Gynecology;  Laterality: N/A;  ? EYE SURGERY    ? per patient "laser treatment to boyh eyes about 2-3 years ago in the summer"  ? THYROIDECTOMY Left 01/31/2020  ? Procedure: LEFT THYROID LOBECTOMY WITH FROZEN SECTION;  Surgeon: Izora Gala, MD;  Location: [facility];  Service: ENT;  Laterality: Left;  ? ?Patient Active Problem List  ? Diagnosis Date Noted  ? Insulin resistance 11/19/2021  ? Polyphagia 07/11/2021  ? S/P thyroidectomy 01/31/2020  ? Migraine without aura 01/20/2017  ? Active labor 10/09/2013  ? Abnormal fetal ultrasound 12/15/2011  ? Small for dates affecting management of mother 12/15/2011  ? Abnormal genetic test in pregnancy 12/03/2011  ? Pregnancy, supervision of, high-risk 12/03/2011  ? HSV-2 infection complicating pregnancy 57/84/6962  ? ? ?PCP: Kathyrn Lass, MD ? ?REFERRING PROVIDER: Allyn Kenner, D.O. ? ?REFERRING DIAG: Female stress incontinence - N39.3 stress incontinence (female)(female) ? ?THERAPY DIAG:  ?Other muscle spasm ? ?Muscle weakness  (generalized) ? ?Unspecified lack of coordination ? ?ONSET DATE: 10/29/2019 ? ?SUBJECTIVE:                                                                                                                                                                                          ? ?SUBJECTIVE STATEMENT: ?Pt states that she has been off schedule the last 2 weeks due to sister's husband passing. She has not been performing exercises or walking program. She has been doing some exercise and feels like she is  leaking through the pads. She feels like exercises are very helpful, she just has not been using them. She has ordered pelvic floor wand, but not used yet.  ? ?PATIENT GOALS to decrease urinary incontinence ? ?PERTINENT HISTORY:  ?Chronic constipation/IBS, c-section 11/29/2011, 2 vaginal deliveries with tearing  ? ? ?OBJECTIVE 02/26/22:  ? ?PELVIC MMT: ?Pelvic floor strength 1-2/5 ?Pelvic floor endurance 4 seconds ?Increased superficial and deep pelvic floor tension bil ?Grade 2 anterior vaginal wall laxity ?Tenderness with palpation of muscular restriction ?Urethral and bladder restriction ?Perineal scar tissue restriction and discomfort  ? ?TODAY'S TREATMENT 02/26/22: ?Manual: ?Soft tissue mobilization: ?Scar tissue mobilization: ?Myofascial release: ?External bladder mobilization/traction ?Spinal mobilization: ?Internal pelvic floor techniques: ?No emotional/communication barriers or cognitive limitation. Patient is motivated to learn. Patient understands and agrees with treatment goals and plan. PT explains patient will be examined in standing, sitting, and lying down to see how their muscles and joints work. When they are ready, they will be asked to remove their underwear so PT can examine their perineum. The patient is also given the option of providing their own chaperone as one is not provided in our facility. The patient also has the right and is explained the right to defer or refuse any part of the evaluation or  treatment including the internal exam. With the patient's consent, PT will use one gloved finger to gently assess the muscles of the pelvic floor, seeing how well it contracts and relaxes and if there is muscle symmetry. After, the patient will get dressed and PT and patient will discuss exam findings and plan of care. PT and patient discuss plan of care, schedule, attendance policy and HEP activities. ?Internal pelvic floor muscle release of superficial and deep muscles ?Urethral mobilization/bladder traction ?Dry needling: ?Neuromuscular re-education: ?Core retraining:  ?Core facilitation: ?Form correction: ?Pelvic floor contraction training: ?Down training: ?Exercises: ?Stretches/mobility: ?Strengthening: ?Therapeutic activities: ?Functional strengthening activities: ?Self-care: ?Pelvic floor wand use, demonstration on model, lubrication ?Benefit of regular orgasm ?Reviewed use of poise impressa and making sure they are in far enough with placement ? ? ? ?PATIENT EDUCATION:  ?Education details: Discussed pelvic floor muscle wand use 2-3x/week for 10-15 min ?Person educated: Patient ?Education method: Explanation, Demonstration, Tactile cues, Verbal cues, and Handouts ?Education comprehension: verbalized understanding ? ? ?HOME EXERCISE PROGRAM: ?[id number]  ? ?ASSESSMENT: ? ?CLINICAL IMPRESSION: ?Pt overall making some progress with use of poise impressa, but has difficulty with it staying in place. We discussed that this could be either due to prolapse pushing the impressa out or not having the impressa far enough in. Her strength and coordination of pelvic floor contraction is improving at between a 1-2/5, but she continues to have significant tension that is most likely preventing her from full strength A/ROM. She does continue to present with grade 2 anterior vaginal wall laxity that contributes to symptoms. We discussed regular use of pelvic floor wand and how to use with model demonstration. Believe she will  continue to benefit form skilled PT intervention in order to progress normalized pelvic floor tone, improve pelvic floor strength/coordination, and return to functional strengthening with this improved strength/coordination. She was encouraged to start with making sure it is far enough in when she initially places it. She will continue to benefit from skilled PT intervention in order to decrease urinary incontinence and progress functional strengthening. ? ? ?OBJECTIVE IMPAIRMENTS decreased activity tolerance, decreased coordination, decreased endurance, decreased mobility, decreased ROM, decreased strength, hypomobility, increased fascial restrictions, increased muscle spasms, postural dysfunction, and pain.  ? ?  ACTIVITY LIMITATIONS  exercise .  ? ?PERSONAL FACTORS 1-2 comorbidities: Chronic constipation/IBS, c-section 11/29/2011, 2 vaginal deliveries with tearing   are also affecting patient's functional outcome.  ? ? ?REHAB POTENTIAL: Good ? ?CLINICAL DECISION MAKING: Stable/uncomplicated ? ?EVALUATION COMPLEXITY: Low ? ? ?GOALS: ?Goals reviewed with patient? Yes ? ?SHORT TERM GOALS: Target date: 03/26/2022 ? ?Pt will be independent with HEP.  ? ?Baseline: ?Goal status: MET ? ?2.  Pt will be independent with the knack and urge suppression technique in order to improve bladder habits and decrease urinary incontinence. ?Baseline:  ?Goal status: MET ? ?3.  Pt will be able to correctly performing diaphragmatic breathing and appropriate pressure management in order to prevent worsening vaginal wall laxity and improve pelvic floor A/ROM. ?Baseline:  ?Goal status: IN PROGRESS ? ? ?LONG TERM GOALS: Target date: 05/21/2022 ? ?Pt will be independent with advanced HEP.  ? ?Baseline:  ?Goal status: IN PROGRESS ? ?2.  Pt will report no leaks with laughing, coughing, sneezing, jumping, and exercise in order to improve comfort with interpersonal relationships and community activities ?Baseline:  ?Goal status: IN PROGRESS ? ?3.  Pt  will demonstrate normal pelvic floor muscle tone and A/ROM, able to achieve 3/5 strength with contractions and 10 sec endurance, in order to provide appropriate lumbopelvic support in functional activities. ?Baseline: 1-2/5 streng

## 2022-03-12 ENCOUNTER — Ambulatory Visit: Payer: 59

## 2022-03-12 DIAGNOSIS — M62838 Other muscle spasm: Secondary | ICD-10-CM | POA: Diagnosis not present

## 2022-03-12 DIAGNOSIS — R279 Unspecified lack of coordination: Secondary | ICD-10-CM

## 2022-03-12 DIAGNOSIS — M6281 Muscle weakness (generalized): Secondary | ICD-10-CM

## 2022-03-12 NOTE — Therapy (Signed)
?OUTPATIENT PHYSICAL THERAPY TREATMENT NOTE ? ? ?Patient Name: Environmental education officer ?MRN: [id number] ?DOB:1986/07/14, 36 y.o., female ?Today's Date: 03/12/2022 ? ?PCP: Kathyrn Lass, MD ?REFERRING PROVIDER: Allyn Kenner, D.O. ? ?END OF SESSION:  ? PT End of Session - 03/12/22 1616   ? ? Visit Number 9   ? Date for PT Re-Evaluation 05/21/22   ? Authorization Type [employer]   ? PT Start Time 1615   ? PT Stop Time 1655   ? PT Time Calculation (min) 40 min   ? Activity Tolerance Patient tolerated treatment well   ? Behavior During Therapy [facility] for tasks assessed/performed   ? ?  ?  ? ?  ? ? ?Past Medical History:  ?Diagnosis Date  ? Back pain   ? Constipation   ? Edema of both lower legs   ? Headache   ? Herpes   ? IBS (irritable bowel syndrome)   ? Memory change   ? No pertinent past medical history   ? Thyroid condition   ? ?Past Surgical History:  ?Procedure Laterality Date  ? CESAREAN SECTION    ? CESAREAN SECTION  12/19/2011  ? Procedure: CESAREAN SECTION;  Surgeon: Jonnie Kind, MD;  Location: [facility];  Service: Gynecology;  Laterality: N/A;  ? EYE SURGERY    ? per patient "laser treatment to boyh eyes about 2-3 years ago in the summer"  ? THYROIDECTOMY Left 01/31/2020  ? Procedure: LEFT THYROID LOBECTOMY WITH FROZEN SECTION;  Surgeon: Izora Gala, MD;  Location: [facility];  Service: ENT;  Laterality: Left;  ? ?Patient Active Problem List  ? Diagnosis Date Noted  ? Insulin resistance 11/19/2021  ? Polyphagia 07/11/2021  ? S/P thyroidectomy 01/31/2020  ? Migraine without aura 01/20/2017  ? Active labor 10/09/2013  ? Abnormal fetal ultrasound 12/15/2011  ? Small for dates affecting management of mother 12/15/2011  ? Abnormal genetic test in pregnancy 12/03/2011  ? Pregnancy, supervision of, high-risk 12/03/2011  ? HSV-2 infection complicating pregnancy [id number]  ? ? ?REFERRING DIAG: Female stress incontinence - N39.3 stress incontinence (female)(female) ? ?THERAPY DIAG:  ?Other muscle spasm ? ?Muscle weakness  (generalized) ? ?Unspecified lack of coordination ? ?PERTINENT HISTORY: Chronic constipation/IBS, c-section 11/29/2011, 2 vaginal deliveries with tearing  ? ?PRECAUTIONS: NA ? ?SUBJECTIVE: Patient states that she is definitely noticing that the poise impressa are coming out during her work outs. She is still having some major leaking episodes during work outs. She is still attempting to stick with voiding schedule. She is using wand and feels like it is helpful.  ? ?PAIN:  ?Are you having pain? No ? ? ?SUBJECTIVE STATEMENT: ?Pt states that she has been off schedule the last 2 weeks due to sister's husband passing. She has not been performing exercises or walking program. She has been doing some exercise and feels like she is leaking through the pads. She feels like exercises are very helpful, she just has not been using them. She has ordered pelvic floor wand, but not used yet.  ?  ?PATIENT GOALS to decrease urinary incontinence ?  ?  ?OBJECTIVE 02/26/22:  ?  ?PELVIC MMT: ?Pelvic floor strength 1-2/5 ?Pelvic floor endurance 4 seconds ?Increased superficial and deep pelvic floor tension bil ?Grade 2 anterior vaginal wall laxity ?Tenderness with palpation of muscular restriction ?Urethral and bladder restriction ?Perineal scar tissue restriction and discomfort  ?  ?TODAY'S TREATMENT 03/12/22: ?Manual: ?Soft tissue mobilization: ?Scar tissue mobilization: ?Myofascial release: ?Spinal mobilization: ?Internal pelvic floor techniques: ?No emotional/communication barriers or cognitive  limitation. Patient is motivated to learn. Patient understands and agrees with treatment goals and plan. PT explains patient will be examined in standing, sitting, and lying down to see how their muscles and joints work. When they are ready, they will be asked to remove their underwear so PT can examine their perineum. The patient is also given the option of providing their own chaperone as one is not provided in our facility. The patient also has  the right and is explained the right to defer or refuse any part of the evaluation or treatment including the internal exam. With the patient's consent, PT will use one gloved finger to gently assess the muscles of the pelvic floor, seeing how well it contracts and relaxes and if there is muscle symmetry. After, the patient will get dressed and PT and patient will discuss exam findings and plan of care. PT and patient discuss plan of care, schedule, attendance policy and HEP activities. ?Internal pelvic floor muscle release of superficial and deep muscles ?Urethral mobilization/bladder traction ?Dry needling: ?Neuromuscular re-education: ?Core retraining:  ?Core facilitation: ?Form correction: ?Pelvic floor contraction training: ?Pelvic floor contraction training with multimodal cues  ?Quick flicks 4 x 10 ?Down training: ?Exercises: ?Stretches/mobility: ?Strengthening: ?Therapeutic activities: ?Functional strengthening activities: ?Self-care: ? ? ? ?TREATMENT 02/26/22: ?Manual: ?Soft tissue mobilization: ?Scar tissue mobilization: ?Myofascial release: ?External bladder mobilization/traction ?Spinal mobilization: ?Internal pelvic floor techniques: ?No emotional/communication barriers or cognitive limitation. Patient is motivated to learn. Patient understands and agrees with treatment goals and plan. PT explains patient will be examined in standing, sitting, and lying down to see how their muscles and joints work. When they are ready, they will be asked to remove their underwear so PT can examine their perineum. The patient is also given the option of providing their own chaperone as one is not provided in our facility. The patient also has the right and is explained the right to defer or refuse any part of the evaluation or treatment including the internal exam. With the patient's consent, PT will use one gloved finger to gently assess the muscles of the pelvic floor, seeing how well it contracts and relaxes and if  there is muscle symmetry. After, the patient will get dressed and PT and patient will discuss exam findings and plan of care. PT and patient discuss plan of care, schedule, attendance policy and HEP activities. ?Internal pelvic floor muscle release of superficial and deep muscles ?Urethral mobilization/bladder traction ?Muscle tapping/stretching to help improve pelvic floor contraction motor control ?Dry needling: ?Neuromuscular re-education: ?Core retraining:  ?Core facilitation: ?Form correction: ?Pelvic floor contraction training: ?Down training: ?Exercises: ?Stretches/mobility: ?Strengthening: ?Therapeutic activities: ?Functional strengthening activities: ?Self-care: ?Pelvic floor wand use, demonstration on model, lubrication ?Benefit of regular orgasm ?Reviewed use of poise impressa and making sure they are in far enough with placement ?  ?  ?  ?PATIENT EDUCATION:  ?Education details: Discussed pelvic floor muscle wand use 2-3x/week for 10-15 min ?Person educated: Patient ?Education method: Explanation, Demonstration, Tactile cues, Verbal cues, and Handouts ?Education comprehension: verbalized understanding ?  ?  ?HOME EXERCISE PROGRAM: ?[id number]  ?  ?ASSESSMENT: ?  ?CLINICAL IMPRESSION: ?Patient not having seen much progress in the last several weeks and is unable to keep poise impressa in during work outs. We discussed trying the largest size to see if that makes a difference, but also wondering if her baseline strength is too low to hold impressa or possible pessary option in at this time. Pelvic floor tension continues to improve along with  bladder/urethral mobility. Return to pelvic floor contraction training and patient able to coordinate the strongest contraction to date with good improvements in motor control; however, they required significant cuing and were slow to achieve full power with each contraction. Muscle tapping/stretching most helpful with strengthening this contraction. Due to this, she was  strongly encouraged to perform at least once a day with internal feedback from her own finger. She will continue to benefit from skilled PT intervention in order to decrease urinary incontinence and progress functional st

## 2022-03-12 NOTE — Progress Notes (Signed)
Chief Complaint:   OBESITY Leah Schneider is here to discuss her progress with her obesity treatment plan along with follow-up of her obesity related diagnoses. Leah Schneider is on the Category 2 Plan and keeping a food journal and adhering to recommended goals of 400-550 calories and 40+ grams of protein at supper daily and states she is following her eating plan approximately 95% of the time. Leah Schneider states she is walking and cardio for 30-60 minutes 6 times per week.  Today's visit was #: 14 Starting weight: 241 lbs Starting date: 05/08/2021 Today's weight: 184 lbs Today's date: 02/25/2022 Total lbs lost to date: 57 Total lbs lost since last in-office visit: 14  Interim History: Leah Schneider continues to do very well with weight loss over the last 2 months.   Subjective:   1. Insulin resistance Leah Schneider is working on decreasing simple carbohydrates and weight loss. No hypoglycemia or nausea noted.   2. Other depression with emotional eating Leah Schneider is dealing with a lot of stress in her family and her extended family.   3. At risk for impaired metabolic function Leah Schneider is at increased risk for impaired metabolic function if protein decreases.   Assessment/Plan:   1. Insulin resistance We will refill metformin for 1 month. Leah Schneider will continue to work on weight loss, exercise, and decreasing simple carbohydrates to help decrease the risk of diabetes. Leah Schneider agreed to follow-up with [place] as directed to closely monitor her progress.  - metFORMIN (GLUCOPHAGE) 500 MG tablet; TAKE 1 TABLET BY MOUTH TWICE DAILY WITH A MEAL  Strength: 500 mg  Dispense: 60 tablet; Refill: 0  2. Other depression with emotional eating Leah Schneider agreed to increase Wellbutrin SR 200 mg daily, and we will refill for 1 month. Behavior modification techniques were discussed today to help Leah Schneider deal with her emotional/non-hunger eating behaviors.  Orders and follow up as documented in patient record.   - buPROPion  (WELLBUTRIN SR) 200 MG 12 hr tablet; Take 1 tablet (200 mg total) by mouth daily.  Dispense: 30 tablet; Refill: 0  3. At risk for impaired metabolic function Leah Schneider was given approximately 15 minutes of impaired  metabolic function prevention counseling today. We discussed intensive lifestyle modifications today with an emphasis on specific nutrition and exercise instructions and strategies.   Repetitive spaced learning was employed today to elicit superior memory formation and behavioral change.  4. Obesity, Current BMI 30.7 Leah Schneider is currently in the action stage of change. As such, her goal is to continue with weight loss efforts. She has agreed to the Category 2 Plan and keeping a food journal and adhering to recommended goals of 400-550 calories and 40+ grams of protein at supper daily.   Exercise goals: As is.  Behavioral modification strategies: increasing lean protein intake and no skipping meals.  Leah Schneider has agreed to follow-up with our clinic in 4 weeks. She was informed of the importance of frequent follow-up visits to maximize her success with intensive lifestyle modifications for her multiple health conditions.   Objective:   Blood pressure 110/74, pulse 84, temperature 97.9 F (36.6 C), height 5\' 5"  (1.651 m), weight 184 lb (83.5 kg), SpO2 99 %, unknown if currently breastfeeding. Body mass index is 30.62 kg/m.  General: Cooperative, alert, well developed, in no acute distress. HEENT: Conjunctivae and lids unremarkable. Cardiovascular: Regular rhythm.  Lungs: Normal work of breathing. Neurologic: No focal deficits.   Lab Results  Component Value Date   CREATININE 0.66 10/18/2021   BUN 13 10/18/2021  NA 140 10/18/2021   K 4.2 10/18/2021   CL 103 10/18/2021   CO2 24 10/18/2021   Lab Results  Component Value Date   ALT 12 10/18/2021   AST 16 10/18/2021   ALKPHOS 101 10/18/2021   BILITOT 0.4 10/18/2021   Lab Results  Component Value Date   HGBA1C 5.1  10/18/2021   HGBA1C 5.3 05/08/2021   Lab Results  Component Value Date   INSULIN 6.8 10/18/2021   INSULIN 21.1 05/08/2021   Lab Results  Component Value Date   TSH 2.060 05/08/2021   Lab Results  Component Value Date   CHOL 131 10/18/2021   HDL 41 10/18/2021   LDLCALC 74 10/18/2021   TRIG 79 10/18/2021   Lab Results  Component Value Date   VD25OH 47.1 10/18/2021   VD25OH 60.1 05/08/2021   Lab Results  Component Value Date   WBC 7.2 05/08/2021   HGB 15.1 05/08/2021   HCT 45.9 05/08/2021   MCV 88 05/08/2021   PLT 243 05/08/2021   No results found for: IRON, TIBC, FERRITIN  Attestation Statements:   Reviewed by clinician on day of visit: allergies, medications, problem list, medical history, surgical history, family history, social history, and previous encounter notes.   I, Burt Knack, am acting as transcriptionist for Quillian Quince, MD.  I have reviewed the above documentation for accuracy and completeness, and I agree with the above. -  Quillian Quince, MD

## 2022-03-26 ENCOUNTER — Ambulatory Visit (INDEPENDENT_AMBULATORY_CARE_PROVIDER_SITE_OTHER): Payer: 59 | Admitting: Family Medicine

## 2022-03-26 ENCOUNTER — Encounter (INDEPENDENT_AMBULATORY_CARE_PROVIDER_SITE_OTHER): Payer: Self-pay | Admitting: Family Medicine

## 2022-03-26 VITALS — BP 109/61 | HR 82 | Temp 97.8°F | Ht 65.0 in | Wt 188.0 lb

## 2022-03-26 DIAGNOSIS — E669 Obesity, unspecified: Secondary | ICD-10-CM

## 2022-03-26 DIAGNOSIS — F3289 Other specified depressive episodes: Secondary | ICD-10-CM

## 2022-03-26 DIAGNOSIS — R42 Dizziness and giddiness: Secondary | ICD-10-CM | POA: Diagnosis not present

## 2022-03-26 DIAGNOSIS — Z6831 Body mass index (BMI) 31.0-31.9, adult: Secondary | ICD-10-CM

## 2022-03-26 DIAGNOSIS — E8881 Metabolic syndrome: Secondary | ICD-10-CM | POA: Diagnosis not present

## 2022-03-26 DIAGNOSIS — Z7984 Long term (current) use of oral hypoglycemic drugs: Secondary | ICD-10-CM

## 2022-03-26 MED ORDER — METFORMIN HCL 500 MG PO TABS: ORAL_TABLET | ORAL | 0 refills | Status: DC

## 2022-03-26 MED ORDER — BUPROPION HCL ER (SR) 200 MG PO TB12: 200.0000 mg | ORAL_TABLET | Freq: Every day | ORAL | 0 refills | Status: DC

## 2022-03-27 ENCOUNTER — Ambulatory Visit: Payer: 59

## 2022-03-27 DIAGNOSIS — M62838 Other muscle spasm: Secondary | ICD-10-CM | POA: Diagnosis not present

## 2022-03-27 DIAGNOSIS — M6281 Muscle weakness (generalized): Secondary | ICD-10-CM

## 2022-03-27 DIAGNOSIS — R279 Unspecified lack of coordination: Secondary | ICD-10-CM

## 2022-03-27 NOTE — Therapy (Signed)
OUTPATIENT PHYSICAL THERAPY TREATMENT NOTE   Patient Name: Leah Schneider MRN: [id number] DOB:[birth year], 36 y.o., female Today's Date: 03/27/2022  PCP: Kathyrn Lass, MD REFERRING PROVIDER: Allyn Kenner, D.O.  END OF SESSION:   PT End of Session - 03/27/22 0752     Visit Number 10    Date for PT Re-Evaluation 05/21/22    Authorization Type [employer]    PT Start Time 0800    PT Stop Time 0840    PT Time Calculation (min) 40 min    Activity Tolerance Patient tolerated treatment well    Behavior During Therapy WFL for tasks assessed/performed             Past Medical History:  Diagnosis Date   Back pain    Constipation    Edema of both lower legs    Headache    Herpes    IBS (irritable bowel syndrome)    Memory change    No pertinent past medical history    Thyroid condition    Past Surgical History:  Procedure Laterality Date   CESAREAN SECTION     CESAREAN SECTION  12/19/2011   Procedure: CESAREAN SECTION;  Surgeon: Jonnie Kind, MD;  Location: [facility];  Service: Gynecology;  Laterality: N/A;   EYE SURGERY     per patient "laser treatment to boyh eyes about 2-3 years ago in the summer"   THYROIDECTOMY Left 01/31/2020   Procedure: LEFT THYROID LOBECTOMY WITH FROZEN SECTION;  Surgeon: Izora Gala, MD;  Location: [facility];  Service: ENT;  Laterality: Left;   Patient Active Problem List   Diagnosis Date Noted   Insulin resistance 11/19/2021   Polyphagia 07/11/2021   S/P thyroidectomy 01/31/2020   Migraine without aura 01/20/2017   Active labor 10/09/2013   Abnormal fetal ultrasound 12/15/2011   Small for dates affecting management of mother 12/15/2011   Abnormal genetic test in pregnancy 12/03/2011   Pregnancy, supervision of, high-risk 12/03/2011   HSV-2 infection complicating pregnancy 24/46/2863    REFERRING DIAG: Female stress incontinence - N39.3 stress incontinence (female)(female)  THERAPY DIAG:  Other muscle spasm  Muscle weakness  (generalized)  Unspecified lack of coordination  PERTINENT HISTORY: Chronic constipation/IBS, c-section 11/29/2011, 2 vaginal deliveries with tearing   PRECAUTIONS: NA  SUBJECTIVE: Patient states that she has started getting back into routine using pelvic floor muscle wand and using her finger. She feels like she is not doing the right thing when she is using her finger. She has tried poise impressa size 3s and they are still coming out during work outs when she is jumping a lot or performing lateral movements. She feels like leaking during work outs has not improved at all.   PAIN:  Are you having pain? No   SUBJECTIVE STATEMENT: Pt states that she has been off schedule the last 2 weeks due to sister's husband passing. She has not been performing exercises or walking program. She has been doing some exercise and feels like she is leaking through the pads. She feels like exercises are very helpful, she just has not been using them. She has ordered pelvic floor wand, but not used yet.    PATIENT GOALS to decrease urinary incontinence     OBJECTIVE 02/26/22:    PELVIC MMT: Pelvic floor strength 1-2/5 Pelvic floor endurance 4 seconds Increased superficial and deep pelvic floor tension bil Grade 2 anterior vaginal wall laxity Tenderness with palpation of muscular restriction Urethral and bladder restriction Perineal scar tissue restriction and discomfort  TODAY'S TREATMENT 03/27/22: Manual: Internal pelvic floor techniques: No emotional/communication barriers or cognitive limitation. Patient is motivated to learn. Patient understands and agrees with treatment goals and plan. PT explains patient will be examined in standing, sitting, and lying down to see how their muscles and joints work. When they are ready, they will be asked to remove their underwear so PT can examine their perineum. The patient is also given the option of providing their own chaperone as one is not provided in our  facility. The patient also has the right and is explained the right to defer or refuse any part of the evaluation or treatment including the internal exam. With the patient's consent, PT will use one gloved finger to gently assess the muscles of the pelvic floor, seeing how well it contracts and relaxes and if there is muscle symmetry. After, the patient will get dressed and PT and patient will discuss exam findings and plan of care. PT and patient discuss plan of care, schedule, attendance policy and HEP activities. Deep pelvic floor muscle release bil Neuromuscular re-education: Pelvic floor contraction training: With bridge, in supine, in sidelying Quick flicks with internal cuing for increase in strength of contraction; various verbal cues to assist in finding contraction and avoid bulging Self-care: Having baseline strength enough to be able to hold poise impressa in Possible benefit of Elitone - will talk to support person to see if we can get insurance coverage    TREATMENT 03/12/22: Manual: Soft tissue mobilization: Scar tissue mobilization: Myofascial release: Spinal mobilization: Internal pelvic floor techniques: No emotional/communication barriers or cognitive limitation. Patient is motivated to learn. Patient understands and agrees with treatment goals and plan. PT explains patient will be examined in standing, sitting, and lying down to see how their muscles and joints work. When they are ready, they will be asked to remove their underwear so PT can examine their perineum. The patient is also given the option of providing their own chaperone as one is not provided in our facility. The patient also has the right and is explained the right to defer or refuse any part of the evaluation or treatment including the internal exam. With the patient's consent, PT will use one gloved finger to gently assess the muscles of the pelvic floor, seeing how well it contracts and relaxes and if there is  muscle symmetry. After, the patient will get dressed and PT and patient will discuss exam findings and plan of care. PT and patient discuss plan of care, schedule, attendance policy and HEP activities. Internal pelvic floor muscle release of superficial and deep muscles Urethral mobilization/bladder traction Dry needling: Neuromuscular re-education: Core retraining:  Core facilitation: Form correction: Pelvic floor contraction training: Pelvic floor contraction training with multimodal cues  Quick flicks 4 x 10 Down training: Exercises: Stretches/mobility: Strengthening: Therapeutic activities: Functional strengthening activities: Self-care:    TREATMENT 02/26/22: Manual: Soft tissue mobilization: Scar tissue mobilization: Myofascial release: External bladder mobilization/traction Spinal mobilization: Internal pelvic floor techniques: No emotional/communication barriers or cognitive limitation. Patient is motivated to learn. Patient understands and agrees with treatment goals and plan. PT explains patient will be examined in standing, sitting, and lying down to see how their muscles and joints work. When they are ready, they will be asked to remove their underwear so PT can examine their perineum. The patient is also given the option of providing their own chaperone as one is not provided in our facility. The patient also has the right and is explained the right to  defer or refuse any part of the evaluation or treatment including the internal exam. With the patient's consent, PT will use one gloved finger to gently assess the muscles of the pelvic floor, seeing how well it contracts and relaxes and if there is muscle symmetry. After, the patient will get dressed and PT and patient will discuss exam findings and plan of care. PT and patient discuss plan of care, schedule, attendance policy and HEP activities. Internal pelvic floor muscle release of superficial and deep muscles Urethral  mobilization/bladder traction Muscle tapping/stretching to help improve pelvic floor contraction motor control Dry needling: Neuromuscular re-education: Core retraining:  Core facilitation: Form correction: Pelvic floor contraction training: Down training: Exercises: Stretches/mobility: Strengthening: Therapeutic activities: Functional strengthening activities: Self-care: Pelvic floor wand use, demonstration on model, lubrication Benefit of regular orgasm Reviewed use of poise impressa and making sure they are in far enough with placement       PATIENT EDUCATION:  Education details: Discussed need for baseline strength in order for pessary/impressa to work; possible benefit of Manufacturing systems engineer educated: Patient Education method: Consulting civil engineer, Media planner, Corporate treasurer cues, Verbal cues, and Handouts Education comprehension: verbalized understanding     HOME EXERCISE PROGRAM: [id number]    ASSESSMENT:   CLINICAL IMPRESSION: Believe poise impressa are not working and coming out due to lack of baseline strength/coordination. We did discuss possible benefit of Elitone device due to this to see if it can help elicit appropriate contraction. We will check to see if we can get insurance coverage. Return to pelvic floor contraction training with internal feedback in various positions to help improve motor control with some pelvic floor release as well. She continues to demonstrate some increased deep pelvic floor tension, most likely due to presence of vaginal wall laxity and habit of bearing down vs. Contracting. Initially when use of strong cues to help initiate a pelvic floor contraction were given, she demonstrated bearing down and bulging - likely part of cause with impressa coming out during more difficult work out moves. When told to do the opposite, there was an appropriate, although very weak 1/5, pelvic floor contraction. We discussed finger in vaginal canal for feedback is not meant for  release, but just to feel this contraction and attempt to make it stronger. She will continue to benefit from skilled PT intervention in order to decrease urinary incontinence and progress functional strengthening.     OBJECTIVE IMPAIRMENTS decreased activity tolerance, decreased coordination, decreased endurance, decreased mobility, decreased ROM, decreased strength, hypomobility, increased fascial restrictions, increased muscle spasms, postural dysfunction, and pain.    ACTIVITY LIMITATIONS  exercise .    PERSONAL FACTORS 1-2 comorbidities: Chronic constipation/IBS, c-section 11/29/2011, 2 vaginal deliveries with tearing   are also affecting patient's functional outcome.      REHAB POTENTIAL: Good   CLINICAL DECISION MAKING: Stable/uncomplicated   EVALUATION COMPLEXITY: Low     GOALS: Goals reviewed with patient? Yes   SHORT TERM GOALS: Target date: 03/26/2022   Pt will be independent with HEP.    Baseline: Goal status: MET   2.  Pt will be independent with the knack and urge suppression technique in order to improve bladder habits and decrease urinary incontinence. Baseline:  Goal status: MET   3.  Pt will be able to correctly performing diaphragmatic breathing and appropriate pressure management in order to prevent worsening vaginal wall laxity and improve pelvic floor A/ROM. Baseline:  Goal status: IN PROGRESS     LONG TERM GOALS: Target date: 05/21/2022   Pt  will be independent with advanced HEP.    Baseline:  Goal status: IN PROGRESS   2.  Pt will report no leaks with laughing, coughing, sneezing, jumping, and exercise in order to improve comfort with interpersonal relationships and community activities Baseline:  Goal status: IN PROGRESS   3.  Pt will demonstrate normal pelvic floor muscle tone and A/ROM, able to achieve 3/5 strength with contractions and 10 sec endurance, in order to provide appropriate lumbopelvic support in functional activities. Baseline: 1-2/5  strength, 4 second endurance Goal status: IN PROGRESS     PLAN: PT FREQUENCY: 1x/week   PT DURATION: 12 weeks   PLANNED INTERVENTIONS: Therapeutic exercises, Therapeutic activity, Neuromuscular re-education, Balance training, Gait training, Patient/Family education, Joint mobilization, Dry Needling, Biofeedback, and Manual therapy   PLAN FOR NEXT SESSION: Review any progress with Elitone coverage; return to pelvic floor muscle motor control.   Heather Roberts, PT, DPT05/31/239:13 AM

## 2022-04-02 ENCOUNTER — Ambulatory Visit: Payer: 59

## 2022-04-02 NOTE — Progress Notes (Signed)
Chief Complaint:   OBESITY November is here to discuss her progress with her obesity treatment plan along with follow-up of her obesity related diagnoses. Leah Schneider is on the Category 2 Plan and keeping a food journal and adhering to recommended goals of 400-550 calories and 40+ grams of protein daily and states she is following her eating plan approximately 90% of the time. Janita states she is walking and doing [employer] for 60 minutes 3-4 times per week.  Today's visit was #: 15 Starting weight: 241 lbs Starting date: 05/08/2021 Today's weight: 188 lbs Today's date: 03/26/2022 Total lbs lost to date: 53 Total lbs lost since last in-office visit: 0  Interim History: Devani continues to work on weight loss, but she is retaining some fluid retention. 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   Subjective:   1. Insulin resistance Leah Schneider is stable on her medications, and she is working on her diet and weight loss. She denies nausea or vomiting.   2. Lightheaded Leah Schneider notes feeling lightheaded when she changes positions. She was told she was dehydrated when she donated blood recently.   3. Other depression with emotional eating Leah Schneider is doing well with identifying emotional eating behaviors. She feels she is able to concentrate more.   Assessment/Plan:   1. Insulin resistance We will refill metformin for 1 month. Nela will continue to work on weight loss, exercise, and decreasing simple carbohydrates to help decrease the risk of diabetes. Leah Schneider agreed to follow-up with Leah Schneider as directed to closely monitor her progress.  - metFORMIN (GLUCOPHAGE) 500 MG tablet; TAKE 1 TABLET BY MOUTH TWICE DAILY WITH A MEAL  Strength: 500 mg  Dispense: 60 tablet; Refill: 0  2. Lightheaded Leah Schneider is to increase her water intake, and we will continue to follow.   3. Other depression with emotional  eating We will refill Wellbutrin SR for 1 month. Behavior modification techniques were discussed today to help Leah Schneider deal with her emotional/non-hunger eating behaviors.  Orders and follow up as documented in patient record.   - buPROPion (WELLBUTRIN SR) 200 MG 12 hr tablet; Take 1 tablet (200 mg total) by mouth daily.  Dispense: 30 tablet; Refill: 0  4. Obesity, Current BMI 31.3 Leah Schneider is currently in the action stage of change. As such, her goal is to continue with weight loss efforts. She has agreed to the Category 2 Plan and keeping a food journal and adhering to recommended goals of 400-500 calories and 40+ grams of protein at supper daily.   Exercise goals: As is.   Behavioral modification strategies: increasing vegetables and meal planning and cooking strategies.  Leah Schneider has agreed to follow-up with our clinic in 4 weeks. She was informed of the importance of frequent follow-up visits to maximize her success with intensive lifestyle modifications for her multiple health conditions.   Objective:   Blood pressure 109/61, pulse 82, temperature 97.8 F (36.6 C), height 5\' 5"  (1.651 m), weight 188 lb (85.3 kg), SpO2 100 %, unknown if currently breastfeeding. Body mass index is 31.28 kg/m.  General: Cooperative, alert, well developed, in no acute distress. HEENT: Conjunctivae and lids unremarkable. Cardiovascular: Regular rhythm.  Lungs: Normal work of breathing. Neurologic: No focal deficits.   Lab Results  Component Value Date   CREATININE 0.66 10/18/2021   BUN 13 10/18/2021   NA 140 10/18/2021   K 4.2 10/18/2021   CL  103 10/18/2021   CO2 24 10/18/2021   Lab Results  Component Value Date   ALT 12 10/18/2021   AST 16 10/18/2021   ALKPHOS 101 10/18/2021   BILITOT 0.4 10/18/2021   Lab Results  Component Value Date   HGBA1C 5.1 10/18/2021   HGBA1C 5.3 05/08/2021   Lab Results  Component Value Date   INSULIN 6.8 10/18/2021   INSULIN 21.1 05/08/2021   Lab Results   Component Value Date   TSH 2.060 05/08/2021   Lab Results  Component Value Date   CHOL 131 10/18/2021   HDL 41 10/18/2021   LDLCALC 74 10/18/2021   TRIG 79 10/18/2021   Lab Results  Component Value Date   VD25OH 47.1 10/18/2021   VD25OH 60.1 05/08/2021   Lab Results  Component Value Date   WBC 7.2 05/08/2021   HGB 15.1 05/08/2021   HCT 45.9 05/08/2021   MCV 88 05/08/2021   PLT 243 05/08/2021   No results found for: IRON, TIBC, FERRITIN  Attestation Statements:   Reviewed by clinician on day of visit: allergies, medications, problem list, medical history, surgical history, family history, social history, and previous encounter notes.   I, Burt Knack, am acting as transcriptionist for Quillian Quince, MD.  I have reviewed the above documentation for accuracy and completeness, and I agree with the above. -  Quillian Quince, MD

## 2022-04-17 ENCOUNTER — Ambulatory Visit: Payer: 59 | Attending: Obstetrics and Gynecology

## 2022-04-17 DIAGNOSIS — R279 Unspecified lack of coordination: Secondary | ICD-10-CM

## 2022-04-17 DIAGNOSIS — M6281 Muscle weakness (generalized): Secondary | ICD-10-CM | POA: Diagnosis present

## 2022-04-17 DIAGNOSIS — M62838 Other muscle spasm: Secondary | ICD-10-CM | POA: Diagnosis present

## 2022-04-17 NOTE — Patient Instructions (Signed)
Pelvic floor strengthening:  3x/day!!!! Lying down, seated, or standing. Morning exercise should be right before you work out.  *If you feel like you are starting to leak during your work outs, stop and perform 5 quick pulses.  At night, perform with super tampon/applicator in vagina for increased feedback.  Each time you go through pelvic floor strengthening, perform: 15 quick flicks 5 long holds (try to hold each contraction 10 seconds - re-contract as needed)   [facility] [address], [address] [place], [place] [address] Phone # [phone] Fax [phone]

## 2022-04-17 NOTE — Therapy (Signed)
OUTPATIENT PHYSICAL THERAPY TREATMENT NOTE   Patient Name: Leah Schneider MRN: [id number] DOB:10-14-1986, 36 y.o., female Today's Date: 04/17/2022  PCP: Kathyrn Lass, MD REFERRING PROVIDER: Allyn Kenner, D.O.  END OF SESSION:   PT End of Session - 04/17/22 1621     Visit Number 11    Date for PT Re-Evaluation 05/21/22    Authorization Type [employer]    PT Start Time 1615    PT Stop Time 1655    PT Time Calculation (min) 40 min    Activity Tolerance Patient tolerated treatment well    Behavior During Therapy WFL for tasks assessed/performed              Past Medical History:  Diagnosis Date   Back pain    Constipation    Edema of both lower legs    Headache    Herpes    IBS (irritable bowel syndrome)    Memory change    No pertinent past medical history    Thyroid condition    Past Surgical History:  Procedure Laterality Date   CESAREAN SECTION     CESAREAN SECTION  12/19/2011   Procedure: CESAREAN SECTION;  Surgeon: Jonnie Kind, MD;  Location: [facility];  Service: Gynecology;  Laterality: N/A;   EYE SURGERY     per patient "laser treatment to boyh eyes about 2-3 years ago in the summer"   THYROIDECTOMY Left 01/31/2020   Procedure: LEFT THYROID LOBECTOMY WITH FROZEN SECTION;  Surgeon: Izora Gala, MD;  Location: [facility];  Service: ENT;  Laterality: Left;   Patient Active Problem List   Diagnosis Date Noted   Insulin resistance 11/19/2021   Polyphagia 07/11/2021   S/P thyroidectomy 01/31/2020   Migraine without aura 01/20/2017   Active labor 10/09/2013   Abnormal fetal ultrasound 12/15/2011   Small for dates affecting management of mother 12/15/2011   Abnormal genetic test in pregnancy 12/03/2011   Pregnancy, supervision of, high-risk 12/03/2011   HSV-2 infection complicating pregnancy 24/26/8341    REFERRING DIAG: Female stress incontinence - N39.3 stress incontinence (female)(female)  THERAPY DIAG:  Other muscle spasm  Muscle weakness  (generalized)  Unspecified lack of coordination  PERTINENT HISTORY: Chronic constipation/IBS, c-section 11/29/2011, 2 vaginal deliveries with tearing   PRECAUTIONS: NA  SUBJECTIVE: Patient states that she is having worsening symptoms with jumping activities. She has to change during work outs. However, she does feel like she has not been performing regular pelvic floor HEP for about 2 months now. When she does do pelvic floor HEP, she works on deep breathing and abdominal exercises.   PAIN:  Are you having pain? No   SUBJECTIVE STATEMENT: Pt states that she has been off schedule the last 2 weeks due to sister's husband passing. She has not been performing exercises or walking program. She has been doing some exercise and feels like she is leaking through the pads. She feels like exercises are very helpful, she just has not been using them. She has ordered pelvic floor wand, but not used yet.    PATIENT GOALS to decrease urinary incontinence     OBJECTIVE 02/26/22:    PELVIC MMT: Pelvic floor strength 1-2/5 Pelvic floor endurance 4 seconds Increased superficial and deep pelvic floor tension bil Grade 2 anterior vaginal wall laxity Tenderness with palpation of muscular restriction Urethral and bladder restriction Perineal scar tissue restriction and discomfort    TODAY'S TREATMENT 04/17/22 Neuromuscular re-education: Pelvic floor contraction training: No emotional/communication barriers or cognitive limitation. Patient is motivated  to learn. Patient understands and agrees with treatment goals and plan. PT explains patient will be examined in standing, sitting, and lying down to see how their muscles and joints work. When they are ready, they will be asked to remove their underwear so PT can examine their perineum. The patient is also given the option of providing their own chaperone as one is not provided in our facility. The patient also has the right and is explained the right to defer  or refuse any part of the evaluation or treatment including the internal exam. With the patient's consent, PT will use one gloved finger to gently assess the muscles of the pelvic floor, seeing how well it contracts and relaxes and if there is muscle symmetry. After, the patient will get dressed and PT and patient will discuss exam findings and plan of care. PT and patient discuss plan of care, schedule, attendance policy and HEP activities. Pelvic floor contraction training with internal vaginal feedback - significant verbal cues provided Self-care: Exercise modification - other free work outs provided - focus on strength training/HIIT style workouts with less jumping just temporarily Use of super tampon with applicate for vaginal weight/feedback  Exercise compliance: review of pelvic floor strengthening program and importance of adherence to performing these exercises regularly   TREATMENT 03/27/22: Manual: Internal pelvic floor techniques: No emotional/communication barriers or cognitive limitation. Patient is motivated to learn. Patient understands and agrees with treatment goals and plan. PT explains patient will be examined in standing, sitting, and lying down to see how their muscles and joints work. When they are ready, they will be asked to remove their underwear so PT can examine their perineum. The patient is also given the option of providing their own chaperone as one is not provided in our facility. The patient also has the right and is explained the right to defer or refuse any part of the evaluation or treatment including the internal exam. With the patient's consent, PT will use one gloved finger to gently assess the muscles of the pelvic floor, seeing how well it contracts and relaxes and if there is muscle symmetry. After, the patient will get dressed and PT and patient will discuss exam findings and plan of care. PT and patient discuss plan of care, schedule, attendance policy and HEP  activities. Deep pelvic floor muscle release bil Neuromuscular re-education: Pelvic floor contraction training: With bridge, in supine, in sidelying Quick flicks with internal cuing for increase in strength of contraction; various verbal cues to assist in finding contraction and avoid bulging Self-care: Having baseline strength enough to be able to hold poise impressa in Possible benefit of Elitone - will talk to support person to see if we can get insurance coverage    TREATMENT 03/12/22: Manual: Soft tissue mobilization: Scar tissue mobilization: Myofascial release: Spinal mobilization: Internal pelvic floor techniques: No emotional/communication barriers or cognitive limitation. Patient is motivated to learn. Patient understands and agrees with treatment goals and plan. PT explains patient will be examined in standing, sitting, and lying down to see how their muscles and joints work. When they are ready, they will be asked to remove their underwear so PT can examine their perineum. The patient is also given the option of providing their own chaperone as one is not provided in our facility. The patient also has the right and is explained the right to defer or refuse any part of the evaluation or treatment including the internal exam. With the patient's consent, PT will use one  gloved finger to gently assess the muscles of the pelvic floor, seeing how well it contracts and relaxes and if there is muscle symmetry. After, the patient will get dressed and PT and patient will discuss exam findings and plan of care. PT and patient discuss plan of care, schedule, attendance policy and HEP activities. Internal pelvic floor muscle release of superficial and deep muscles Urethral mobilization/bladder traction Dry needling: Neuromuscular re-education: Core retraining:  Core facilitation: Form correction: Pelvic floor contraction training: Pelvic floor contraction training with multimodal cues   Quick flicks 4 x 10 Down training: Exercises: Stretches/mobility: Strengthening: Therapeutic activities: Functional strengthening activities: Self-care:    PATIENT EDUCATION:  Education details: See above self-care Person educated: Patient Education method: Explanation, Demonstration, Tactile cues, Verbal cues, and Handouts Education comprehension: verbalized understanding     HOME EXERCISE PROGRAM: [id number]    ASSESSMENT:   CLINICAL IMPRESSION: Patient is seeing overall worsening of condition. Believe that her high impact work outs could be exacerbating symptoms at this time when she is still trying to find active contraction and incorporate into exercises. We discussed other temporary options for work outs to stay active and with her weight loss program, but might strain pelvic floor less right now. We also discussed importance of HEP compliance and that she will not see progress if she does not perform pelvic floor exercises. Suggested to use tampon/applicator in various positions to improve proprioception of contraction. She did much better with active pelvic floor contraction today and was more consistent with correct contraction. She was encouraged to think of the verbal cues we utilized today. She will continue to benefit from skilled PT intervention in order to decrease urinary incontinence and progress functional strengthening.     OBJECTIVE IMPAIRMENTS decreased activity tolerance, decreased coordination, decreased endurance, decreased mobility, decreased ROM, decreased strength, hypomobility, increased fascial restrictions, increased muscle spasms, postural dysfunction, and pain.    ACTIVITY LIMITATIONS  exercise .    PERSONAL FACTORS 1-2 comorbidities: Chronic constipation/IBS, c-section 11/29/2011, 2 vaginal deliveries with tearing   are also affecting patient's functional outcome.      REHAB POTENTIAL: Good   CLINICAL DECISION MAKING: Stable/uncomplicated    EVALUATION COMPLEXITY: Low     GOALS: Goals reviewed with patient? Yes   SHORT TERM GOALS: Target date: 03/26/2022    Pt will be independent with HEP.    Baseline: Goal status: MET   2.  Pt will be independent with the knack and urge suppression technique in order to improve bladder habits and decrease urinary incontinence. Baseline:  Goal status: MET   3.  Pt will be able to correctly performing diaphragmatic breathing and appropriate pressure management in order to prevent worsening vaginal wall laxity and improve pelvic floor A/ROM. Baseline:  Goal status: MET 04/17/22     LONG TERM GOALS: Target date: 05/21/2022 - updated 04/17/22   Pt will be independent with advanced HEP.    Baseline:  Goal status: IN PROGRESS   2.  Pt will report no leaks with laughing, coughing, sneezing, jumping, and exercise in order to improve comfort with interpersonal relationships and community activities Baseline:  Goal status: IN PROGRESS   3.  Pt will demonstrate normal pelvic floor muscle tone and A/ROM, able to achieve 3/5 strength with contractions and 10 sec endurance, in order to provide appropriate lumbopelvic support in functional activities. Baseline: 1-2/5 strength, 4 second endurance Goal status: IN PROGRESS     PLAN: PT FREQUENCY: 1x/week   PT DURATION: 12 weeks  PLANNED INTERVENTIONS: Therapeutic exercises, Therapeutic activity, Neuromuscular re-education, Balance training, Gait training, Patient/Family education, Joint mobilization, Dry Needling, Biofeedback, and Manual therapy   PLAN FOR NEXT SESSION: Continue to progress pelvic floor motor control exercises.   Heather Roberts, PT, DPT06/21/235:08 PM

## 2022-04-22 ENCOUNTER — Other Ambulatory Visit (INDEPENDENT_AMBULATORY_CARE_PROVIDER_SITE_OTHER): Payer: Self-pay | Admitting: Family Medicine

## 2022-04-22 DIAGNOSIS — F3289 Other specified depressive episodes: Secondary | ICD-10-CM

## 2022-04-23 ENCOUNTER — Ambulatory Visit (INDEPENDENT_AMBULATORY_CARE_PROVIDER_SITE_OTHER): Payer: 59 | Admitting: Family Medicine

## 2022-04-24 ENCOUNTER — Other Ambulatory Visit (INDEPENDENT_AMBULATORY_CARE_PROVIDER_SITE_OTHER): Payer: Self-pay | Admitting: Family Medicine

## 2022-04-24 DIAGNOSIS — F3289 Other specified depressive episodes: Secondary | ICD-10-CM

## 2022-04-24 MED ORDER — BUPROPION HCL ER (SR) 200 MG PO TB12: 200.0000 mg | ORAL_TABLET | Freq: Every day | ORAL | 0 refills | Status: DC

## 2022-04-24 NOTE — Telephone Encounter (Signed)
Because Dr Dalbert Garnet had to cancel d/t illness.  Ok to RF 30d only.  Make OV for future RF's

## 2022-04-24 NOTE — Telephone Encounter (Signed)
LAST APPOINTMENT DATE: 03/26/22 NEXT APPOINTMENT DATE: Sent message to pt to call, R/S due to Dr [facility] [address], [place] - [address] [place] [address] Phone: [phone] Fax: [phone]  [facility] [place] [facility] [place], [place] - [address] [place] [address] Phone: [phone] Fax: [phone]  Patient is requesting a refill of the following medications: Pending Prescriptions:                       Disp   Refills   buPROPion (WELLBUTRIN SR) 200 MG 12 hr tab*30 tab*0       Sig: Take 1 tablet (200 mg total) by mouth daily.   Date last filled: 03/26/22 Previously prescribed by Dr Dalbert Garnet  Lab Results      Component                Value               Date                      HGBA1C                   5.1                 10/18/2021                HGBA1C                   5.3                 05/08/2021           Lab Results      Component                Value               Date                      LDLCALC                  74                  10/18/2021                CREATININE               0.66                10/18/2021           Lab Results      Component                Value               Date                      VD25OH                   47.1                10/18/2021                VD25OH                   60.1  05/08/2021            BP Readings from Last 3 Encounters: 03/26/22 : 109/61 02/25/22 : 110/74 01/03/22 : 117/76

## 2022-05-14 ENCOUNTER — Ambulatory Visit: Payer: BC Managed Care – PPO | Attending: Obstetrics and Gynecology

## 2022-05-14 DIAGNOSIS — R279 Unspecified lack of coordination: Secondary | ICD-10-CM | POA: Insufficient documentation

## 2022-05-14 DIAGNOSIS — M6281 Muscle weakness (generalized): Secondary | ICD-10-CM | POA: Insufficient documentation

## 2022-05-14 DIAGNOSIS — M62838 Other muscle spasm: Secondary | ICD-10-CM | POA: Insufficient documentation

## 2022-05-14 NOTE — Therapy (Signed)
OUTPATIENT PHYSICAL THERAPY TREATMENT NOTE   Patient Name: Leah Schneider MRN: [id number] DOB:07-05-1986, 36 y.o., female Today's Date: 05/14/2022  PCP: Kathyrn Lass, MD REFERRING PROVIDER: Allyn Kenner, D.O.  END OF SESSION:   PT End of Session - 05/14/22 0956     Visit Number 12    Date for PT Re-Evaluation 05/21/22    Authorization Type [employer]et[employer]    PT Start Time [phone]    PT Stop Time 1015    PT Time Calculation (min) 20 min    Activity Tolerance Patient tolerated treatment well    Behavior During Therapy [facility] for tasks assessed/performed               Past Medical History:  Diagnosis Date   Back pain    Constipation    Edema of both lower legs    Headache    Herpes    IBS (irritable bowel syndrome)    Memory change    No pertinent past medical history    Thyroid condition    Past Surgical History:  Procedure Laterality Date   CESAREAN SECTION     CESAREAN SECTION  12/19/2011   Procedure: CESAREAN SECTION;  Surgeon: Jonnie Kind, MD;  Location: [facility];  Service: Gynecology;  Laterality: N/A;   EYE SURGERY     per patient "laser treatment to boyh eyes about 2-3 years ago in the summer"   THYROIDECTOMY Left 01/31/2020   Procedure: LEFT THYROID LOBECTOMY WITH FROZEN SECTION;  Surgeon: Izora Gala, MD;  Location: [facility];  Service: ENT;  Laterality: Left;   Patient Active Problem List   Diagnosis Date Noted   Insulin resistance 11/19/2021   Polyphagia 07/11/2021   S/P thyroidectomy 01/31/2020   Migraine without aura 01/20/2017   Active labor 10/09/2013   Abnormal fetal ultrasound 12/15/2011   Small for dates affecting management of mother 12/15/2011   Abnormal genetic test in pregnancy 12/03/2011   Pregnancy, supervision of, high-risk 12/03/2011   HSV-2 infection complicating pregnancy 09/98/3382    REFERRING DIAG: Female stress incontinence - N39.3 stress incontinence (female)(female)  THERAPY DIAG:  Other muscle spasm  Muscle weakness  (generalized)  Unspecified lack of coordination  PERTINENT HISTORY: Chronic constipation/IBS, c-section 11/29/2011, 2 vaginal deliveries with tearing   PRECAUTIONS: NA  SUBJECTIVE: Patient has been trying to get back into more of a routine with exercises. She states that the last 2 weeks she has not had much leaking. She has been working on pelvic floor exercises regularly (every other day). She has not used tampon applicator for feedback when doing pelvic floor contractions.   PAIN:  Are you having pain? No   SUBJECTIVE STATEMENT: Pt states that she has been off schedule the last 2 weeks due to sister's husband passing. She has not been performing exercises or walking program. She has been doing some exercise and feels like she is leaking through the pads. She feels like exercises are very helpful, she just has not been using them. She has ordered pelvic floor wand, but not used yet.    PATIENT GOALS to decrease urinary incontinence     OBJECTIVE 02/26/22:    PELVIC MMT: Pelvic floor strength 1-2/5 Pelvic floor endurance 4 seconds Increased superficial and deep pelvic floor tension bil Grade 2 anterior vaginal wall laxity Tenderness with palpation of muscular restriction Urethral and bladder restriction Perineal scar tissue restriction and discomfort    TODAY'S TREATMENT 05/14/22 Neuromuscular re-education: Pelvic floor contraction training: No emotional/communication barriers or cognitive limitation. Patient is  motivated to learn. Patient understands and agrees with treatment goals and plan. PT explains patient will be examined in standing, sitting, and lying down to see how their muscles and joints work. When they are ready, they will be asked to remove their underwear so PT can examine their perineum. The patient is also given the option of providing their own chaperone as one is not provided in our facility. The patient also has the right and is explained the right to defer or  refuse any part of the evaluation or treatment including the internal exam. With the patient's consent, PT will use one gloved finger to gently assess the muscles of the pelvic floor, seeing how well it contracts and relaxes and if there is muscle symmetry. After, the patient will get dressed and PT and patient will discuss exam findings and plan of care. PT and patient discuss plan of care, schedule, attendance policy and HEP activities. Quick flicks 35H Long holds 5 x 10 sec Self-care: HEP compliance, increasing frequency of pelvic floor contraction performance    TREATMENT 04/17/22 Neuromuscular re-education: Pelvic floor contraction training: No emotional/communication barriers or cognitive limitation. Patient is motivated to learn. Patient understands and agrees with treatment goals and plan. PT explains patient will be examined in standing, sitting, and lying down to see how their muscles and joints work. When they are ready, they will be asked to remove their underwear so PT can examine their perineum. The patient is also given the option of providing their own chaperone as one is not provided in our facility. The patient also has the right and is explained the right to defer or refuse any part of the evaluation or treatment including the internal exam. With the patient's consent, PT will use one gloved finger to gently assess the muscles of the pelvic floor, seeing how well it contracts and relaxes and if there is muscle symmetry. After, the patient will get dressed and PT and patient will discuss exam findings and plan of care. PT and patient discuss plan of care, schedule, attendance policy and HEP activities. Pelvic floor contraction training with internal vaginal feedback - significant verbal cues provided Self-care: Exercise modification - other free work outs provided - focus on strength training/HIIT style workouts with less jumping just temporarily Use of super tampon with applicate for  vaginal weight/feedback  Exercise compliance: review of pelvic floor strengthening program and importance of adherence to performing these exercises regularly   TREATMENT 03/27/22: Manual: Internal pelvic floor techniques: No emotional/communication barriers or cognitive limitation. Patient is motivated to learn. Patient understands and agrees with treatment goals and plan. PT explains patient will be examined in standing, sitting, and lying down to see how their muscles and joints work. When they are ready, they will be asked to remove their underwear so PT can examine their perineum. The patient is also given the option of providing their own chaperone as one is not provided in our facility. The patient also has the right and is explained the right to defer or refuse any part of the evaluation or treatment including the internal exam. With the patient's consent, PT will use one gloved finger to gently assess the muscles of the pelvic floor, seeing how well it contracts and relaxes and if there is muscle symmetry. After, the patient will get dressed and PT and patient will discuss exam findings and plan of care. PT and patient discuss plan of care, schedule, attendance policy and HEP activities. Deep pelvic floor muscle release  bil Neuromuscular re-education: Pelvic floor contraction training: With bridge, in supine, in sidelying Quick flicks with internal cuing for increase in strength of contraction; various verbal cues to assist in finding contraction and avoid bulging Self-care: Having baseline strength enough to be able to hold poise impressa in Possible benefit of Elitone - will talk to support person to see if we can get insurance coverage     PATIENT EDUCATION:  Education details: See above self-care Person educated: Patient Education method: Explanation, Demonstration, Tactile cues, Verbal cues, and Handouts Education comprehension: verbalized understanding     HOME EXERCISE  PROGRAM: [id number]    ASSESSMENT:   CLINICAL IMPRESSION: Patient is seeing good progress with leaking, but has not been doing the exercises that are causing her the increase in stress urinary incontinence. She was encouraged to continue modifying work outs until we can get pelvic floor stronger through these activities. She is only performing pelvic floor contractions 1x/day every other day; we discussed that this frequency is not high enough and she really needs to focus on 3x/day most days. Internal pelvic floor exam performed demonstrating good improvements in motor control and strength/endurance of pelvic floor. Although she was only a 1/5 strength, she was able to immediately facilitate appropriate contraction and work on 10 second holds with awareness of losing her control and re-contracting.   She will continue to benefit from skilled PT intervention in order to decrease urinary incontinence and progress functional strengthening.     OBJECTIVE IMPAIRMENTS decreased activity tolerance, decreased coordination, decreased endurance, decreased mobility, decreased ROM, decreased strength, hypomobility, increased fascial restrictions, increased muscle spasms, postural dysfunction, and pain.    ACTIVITY LIMITATIONS  exercise .    PERSONAL FACTORS 1-2 comorbidities: Chronic constipation/IBS, c-section 11/29/2011, 2 vaginal deliveries with tearing   are also affecting patient's functional outcome.      REHAB POTENTIAL: Good   CLINICAL DECISION MAKING: Stable/uncomplicated   EVALUATION COMPLEXITY: Low     GOALS: Goals reviewed with patient? Yes   SHORT TERM GOALS: Target date: 03/26/2022    Pt will be independent with HEP.    Baseline: Goal status: MET   2.  Pt will be independent with the knack and urge suppression technique in order to improve bladder habits and decrease urinary incontinence. Baseline:  Goal status: MET   3.  Pt will be able to correctly performing diaphragmatic  breathing and appropriate pressure management in order to prevent worsening vaginal wall laxity and improve pelvic floor A/ROM. Baseline:  Goal status: MET 04/17/22     LONG TERM GOALS: Target date: 05/21/2022 - updated 05/14/22   Pt will be independent with advanced HEP.    Baseline:  Goal status: IN PROGRESS   2.  Pt will report no leaks with laughing, coughing, sneezing, jumping, and exercise in order to improve comfort with interpersonal relationships and community activities Baseline:  Goal status: IN PROGRESS   3.  Pt will demonstrate normal pelvic floor muscle tone and A/ROM, able to achieve 3/5 strength with contractions and 10 sec endurance, in order to provide appropriate lumbopelvic support in functional activities. Baseline: 1-2/5 strength, 4 second endurance Goal status: IN PROGRESS     PLAN: PT FREQUENCY: 1x/week   PT DURATION: 12 weeks   PLANNED INTERVENTIONS: Therapeutic exercises, Therapeutic activity, Neuromuscular re-education, Balance training, Gait training, Patient/Family education, Joint mobilization, Dry Needling, Biofeedback, and Manual therapy   PLAN FOR NEXT SESSION: Continue to progress pelvic floor motor control exercises.   Heather Roberts, PT, DPT07/18/2310:56 AM

## 2022-05-16 ENCOUNTER — Ambulatory Visit (INDEPENDENT_AMBULATORY_CARE_PROVIDER_SITE_OTHER): Payer: BC Managed Care – PPO | Admitting: Family Medicine

## 2022-05-16 ENCOUNTER — Encounter (INDEPENDENT_AMBULATORY_CARE_PROVIDER_SITE_OTHER): Payer: Self-pay | Admitting: Family Medicine

## 2022-05-16 VITALS — BP 123/80 | HR 71 | Temp 97.7°F | Ht 65.0 in | Wt 185.0 lb

## 2022-05-16 DIAGNOSIS — Z6830 Body mass index (BMI) 30.0-30.9, adult: Secondary | ICD-10-CM

## 2022-05-16 DIAGNOSIS — F3289 Other specified depressive episodes: Secondary | ICD-10-CM | POA: Diagnosis not present

## 2022-05-16 DIAGNOSIS — E8881 Metabolic syndrome: Secondary | ICD-10-CM | POA: Diagnosis not present

## 2022-05-16 DIAGNOSIS — F32A Depression, unspecified: Secondary | ICD-10-CM | POA: Insufficient documentation

## 2022-05-16 DIAGNOSIS — E669 Obesity, unspecified: Secondary | ICD-10-CM | POA: Diagnosis not present

## 2022-05-16 DIAGNOSIS — E88819 Insulin resistance, unspecified: Secondary | ICD-10-CM

## 2022-05-16 DIAGNOSIS — E66813 Obesity, class 3: Secondary | ICD-10-CM

## 2022-05-16 MED ORDER — METFORMIN HCL 500 MG PO TABS: ORAL_TABLET | ORAL | 0 refills | Status: DC

## 2022-05-16 MED ORDER — BUPROPION HCL ER (SR) 200 MG PO TB12: 200.0000 mg | ORAL_TABLET | Freq: Every day | ORAL | 0 refills | Status: DC

## 2022-05-21 ENCOUNTER — Ambulatory Visit: Payer: BC Managed Care – PPO

## 2022-05-21 DIAGNOSIS — M62838 Other muscle spasm: Secondary | ICD-10-CM | POA: Diagnosis present

## 2022-05-21 DIAGNOSIS — M6281 Muscle weakness (generalized): Secondary | ICD-10-CM

## 2022-05-21 DIAGNOSIS — R279 Unspecified lack of coordination: Secondary | ICD-10-CM

## 2022-05-21 NOTE — Therapy (Signed)
OUTPATIENT PHYSICAL THERAPY TREATMENT NOTE   Patient Name: Leah Schneider MRN: [id number] DOB:September 10, 1986, 36 y.o., female Today's Date: 05/21/2022  PCP: Kathyrn Lass, MD REFERRING PROVIDER: Allyn Kenner, D.O.  END OF SESSION:   PT End of Session - 05/21/22 0846     Visit Number 13    Date for PT Re-Evaluation 08/13/22    Authorization Type [employer]    PT Start Time 0845    PT Stop Time 0918    PT Time Calculation (min) 33 min    Activity Tolerance Patient tolerated treatment well    Behavior During Therapy [facility] for tasks assessed/performed               Past Medical History:  Diagnosis Date   Back pain    Constipation    Edema of both lower legs    Headache    Herpes    IBS (irritable bowel syndrome)    Memory change    No pertinent past medical history    Thyroid condition    Past Surgical History:  Procedure Laterality Date   CESAREAN SECTION     CESAREAN SECTION  12/19/2011   Procedure: CESAREAN SECTION;  Surgeon: Jonnie Kind, MD;  Location: [facility];  Service: Gynecology;  Laterality: N/A;   EYE SURGERY     per patient "laser treatment to boyh eyes about 2-3 years ago in the summer"   THYROIDECTOMY Left 01/31/2020   Procedure: LEFT THYROID LOBECTOMY WITH FROZEN SECTION;  Surgeon: Izora Gala, MD;  Location: [place];  Service: ENT;  Laterality: Left;   Patient Active Problem List   Diagnosis Date Noted   Depression 05/16/2022   Insulin resistance 11/19/2021   Polyphagia 07/11/2021   S/P thyroidectomy 01/31/2020   Migraine without aura 01/20/2017   Active labor 10/09/2013   Abnormal fetal ultrasound 12/15/2011   Small for dates affecting management of mother 12/15/2011   Abnormal genetic test in pregnancy 12/03/2011   Pregnancy, supervision of, high-risk 12/03/2011   HSV-2 infection complicating pregnancy 10/62/6948    REFERRING DIAG: Female stress incontinence - N39.3 stress incontinence (female)(female)  THERAPY DIAG:  Other muscle spasm - Plan:  PT plan of care cert/re-cert  Muscle weakness (generalized) - Plan: PT plan of care cert/re-cert  Unspecified lack of coordination - Plan: PT plan of care cert/re-cert  PERTINENT HISTORY: Chronic constipation/IBS, c-section 11/29/2011, 2 vaginal deliveries with tearing   PRECAUTIONS: NA  SUBJECTIVE: Pt states that she is working harder on performing performing pelvic floor contractions. She reports no leaking or urgency over the last week, but she has not been working out. She is still working on modifying exercises that are causing the leaking during her work outs.   PAIN:  Are you having pain? No   SUBJECTIVE STATEMENT: Pt states that she has been off schedule the last 2 weeks due to sister's husband passing. She has not been performing exercises or walking program. She has been doing some exercise and feels like she is leaking through the pads. She feels like exercises are very helpful, she just has not been using them. She has ordered pelvic floor wand, but not used yet.    PATIENT GOALS to decrease urinary incontinence     OBJECTIVE 05/21/22: more difficulty with active pelvic floor contraction compared to last two sessions; required significant cues to get 1/5 contraction with internal vaginal pressure applied for feedback; no long hold tested today due to difficulty achieving quick flick.   02/26/22:    PELVIC MMT:  Pelvic floor strength 1-2/5 Pelvic floor endurance 4 seconds Increased superficial and deep pelvic floor tension bil Grade 2 anterior vaginal wall laxity Tenderness with palpation of muscular restriction Urethral and bladder restriction Perineal scar tissue restriction and discomfort    TODAY'S TREATMENT 05/21/22 RE-EVALUATION Neuromuscular re-education: Pelvic floor contraction training: No emotional/communication barriers or cognitive limitation. Patient is motivated to learn. Patient understands and agrees with treatment goals and plan. PT explains patient will  be examined in standing, sitting, and lying down to see how their muscles and joints work. When they are ready, they will be asked to remove their underwear so PT can examine their perineum. The patient is also given the option of providing their own chaperone as one is not provided in our facility. The patient also has the right and is explained the right to defer or refuse any part of the evaluation or treatment including the internal exam. With the patient's consent, PT will use one gloved finger to gently assess the muscles of the pelvic floor, seeing how well it contracts and relaxes and if there is muscle symmetry. After, the patient will get dressed and PT and patient will discuss exam findings and plan of care. PT and patient discuss plan of care, schedule, attendance policy and HEP activities. Quick flick training with multimodal cues for improved motor control with vaginal feedback Self-care: Revisited importance of HEP compliance Pt ordered vaginal weights - discussed just using the smallest/lightest one for feedback rather than using for resistance which she does not need at this point; discussed various position lying down/quadruped to perform contractions with light weight Discussed role of good sleep habits and rest in strengthening Discussed that there is not targeted weight loss in certain body areas with exercise, only toning.   TREATMENT 05/14/22 Neuromuscular re-education: Pelvic floor contraction training: No emotional/communication barriers or cognitive limitation. Patient is motivated to learn. Patient understands and agrees with treatment goals and plan. PT explains patient will be examined in standing, sitting, and lying down to see how their muscles and joints work. When they are ready, they will be asked to remove their underwear so PT can examine their perineum. The patient is also given the option of providing their own chaperone as one is not provided in our facility. The  patient also has the right and is explained the right to defer or refuse any part of the evaluation or treatment including the internal exam. With the patient's consent, PT will use one gloved finger to gently assess the muscles of the pelvic floor, seeing how well it contracts and relaxes and if there is muscle symmetry. After, the patient will get dressed and PT and patient will discuss exam findings and plan of care. PT and patient discuss plan of care, schedule, attendance policy and HEP activities. Quick flicks 27P Long holds 5 x 10 sec Self-care: HEP compliance, increasing frequency of pelvic floor contraction performance    TREATMENT 04/17/22 Neuromuscular re-education: Pelvic floor contraction training: No emotional/communication barriers or cognitive limitation. Patient is motivated to learn. Patient understands and agrees with treatment goals and plan. PT explains patient will be examined in standing, sitting, and lying down to see how their muscles and joints work. When they are ready, they will be asked to remove their underwear so PT can examine their perineum. The patient is also given the option of providing their own chaperone as one is not provided in our facility. The patient also has the right and is explained the right to  defer or refuse any part of the evaluation or treatment including the internal exam. With the patient's consent, PT will use one gloved finger to gently assess the muscles of the pelvic floor, seeing how well it contracts and relaxes and if there is muscle symmetry. After, the patient will get dressed and PT and patient will discuss exam findings and plan of care. PT and patient discuss plan of care, schedule, attendance policy and HEP activities. Pelvic floor contraction training with internal vaginal feedback - significant verbal cues provided Self-care: Exercise modification - other free work outs provided - focus on strength training/HIIT style workouts with less  jumping just temporarily Use of super tampon with applicate for vaginal weight/feedback  Exercise compliance: review of pelvic floor strengthening program and importance of adherence to performing these exercises regularly     PATIENT EDUCATION:  Education details: See above self-care and HEP compliance Person educated: Patient Education method: Explanation, Demonstration, Tactile cues, Verbal cues, and Handouts Education comprehension: verbalized understanding     HOME EXERCISE PROGRAM: [id number]    ASSESSMENT:   CLINICAL IMPRESSION: Patient continues to see progress with leaking and urgency during daily activities, but feels like jumping activities would still cause issues if she was performing. We discussed continuing to modify work outs to leave out exercises or decrease intensity to the point in which she is not leaking - options given in the past and today for these modifications. These exercises that cause leaking may not always need to be avoided, but while she is learning how to contract pelvic floor and strengthen appropriately, it is important not to provide this increase in stress to the vaginal wall/pelvic floor. She is doing very well with managing pelvic floor tension and diaphragmatic breathing for improved pelvic floor release. Current pelvic floor strength is 1/5 when she is able to perform active pelvic floor contraction, which is only about 50% of the time with significant amount of verbal and tactile cuing. She was reminded again that using finger and lightest vaginal weight are very important for providing her feedback if she is performing these contractions correctly so she can build better motor control. Compliance is the most important part of her current HEP and is what is standing in the way of progress with jumping activities. She will continue to benefit from skilled PT intervention in order to decrease urinary incontinence and progress functional strengthening.      OBJECTIVE IMPAIRMENTS decreased activity tolerance, decreased coordination, decreased endurance, decreased mobility, decreased ROM, decreased strength, hypomobility, increased fascial restrictions, increased muscle spasms, postural dysfunction, and pain.    ACTIVITY LIMITATIONS  exercise .    PERSONAL FACTORS 1-2 comorbidities: Chronic constipation/IBS, c-section 11/29/2011, 2 vaginal deliveries with tearing   are also affecting patient's functional outcome.      REHAB POTENTIAL: Good   CLINICAL DECISION MAKING: Stable/uncomplicated   EVALUATION COMPLEXITY: Low     GOALS: Goals reviewed with patient? Yes   SHORT TERM GOALS: Target date: 03/26/2022    Pt will be independent with HEP.    Baseline: Goal status: MET   2.  Pt will be independent with the knack and urge suppression technique in order to improve bladder habits and decrease urinary incontinence. Baseline:  Goal status: MET   3.  Pt will be able to correctly performing diaphragmatic breathing and appropriate pressure management in order to prevent worsening vaginal wall laxity and improve pelvic floor A/ROM. Baseline:  Goal status: MET 04/17/22     LONG TERM GOALS: Target  date: 05/21/2022 - updated 05/21/22   Pt will be independent with advanced HEP.    Baseline:  Goal status: IN PROGRESS   2.  Pt will report no leaks with laughing, coughing, sneezing, jumping, and exercise in order to improve comfort with interpersonal relationships and community activities Baseline:  Goal status: IN PROGRESS   3.  Pt will demonstrate normal pelvic floor muscle tone and A/ROM, able to achieve 3/5 strength with contractions and 10 sec endurance, in order to provide appropriate lumbopelvic support in functional activities. Baseline: 1-2/5 strength, 4 second endurance Goal status: IN PROGRESS     PLAN: PT FREQUENCY: 1x/week - 1x/month   PT DURATION: 12 weeks   PLANNED INTERVENTIONS: Therapeutic exercises, Therapeutic activity,  Neuromuscular re-education, Balance training, Gait training, Patient/Family education, Joint mobilization, Dry Needling, Biofeedback, and Manual therapy   PLAN FOR NEXT SESSION: Continue to progress pelvic floor motor control exercises. Possibly D/C depending on pt preference; other option to be seen 1x/month for help with compliance.  Heather Roberts, PT, DPT07/25/239:31 AM

## 2022-05-22 NOTE — Progress Notes (Unsigned)
Chief Complaint:   OBESITY Leah Schneider is here to discuss her progress with her obesity treatment plan along with follow-up of her obesity related diagnoses. Leah Schneider is on the Category 2 Plan and keeping a food journal and adhering to recommended goals of 400-500 calories and 40+ grams of protein at supper daily and states she is following her eating plan approximately 90% of the time. Leah Schneider states she is walking for 30-60 minutes 5 times per week.  Today's visit was #: 16 Starting weight: 241 lbs Starting date: 05/08/2021 Today's weight: 185 lbs Today's date: 05/16/2022 Total lbs lost to date: 56 Total lbs lost since last in-office visit: 3  Interim History: Leah Schneider has done well with weight loss despite extra challenges with her work and schedule.  She is exercising regularly and continues to work on increasing protein.  Subjective:   1. Insulin resistance Leah Schneider is working on her diet, exercise, and metformin.  No side effects were noted.  She is working on decreasing simple carbohydrates.  2. Other depression with emotional eating Leah Schneider is stable on her medications, and she is working on decreasing emotional eating behaviors.  She is doing well with weight loss despite extra challenges.  Assessment/Plan:   1. Insulin resistance Leah Schneider will continue metformin 500 mg twice daily with meals, and we will refill for 1 month.  - metFORMIN (GLUCOPHAGE) 500 MG tablet; TAKE 1 TABLET BY MOUTH TWICE DAILY WITH A MEAL  Strength: 500 mg  Dispense: 60 tablet; Refill: 0  2. Other depression with emotional eating Leah Schneider will continue Wellbutrin SR 200 mg once daily, and we will refill for 1 month.  - buPROPion (WELLBUTRIN SR) 200 MG 12 hr tablet; Take 1 tablet (200 mg total) by mouth daily.  Dispense: 30 tablet; Refill: 0  3. Obesity, Current BMI 30.8 Leah Schneider is currently in the action stage of change. As such, her goal is to continue with weight loss efforts. She has agreed to the  Category 2 Plan.   Exercise goals: As is.  Behavioral modification strategies: increasing lean protein intake and meal planning and cooking strategies.  Leah Schneider has agreed to follow-up with our clinic in 3 to 4 weeks. She was informed of the importance of frequent follow-up visits to maximize her success with intensive lifestyle modifications for her multiple health conditions.   Objective:   Blood pressure 123/80, pulse 71, temperature 97.7 F (36.5 C), height 5\' 5"  (1.651 m), weight 185 lb (83.9 kg), SpO2 100 %, unknown if currently breastfeeding. Body mass index is 30.79 kg/m.  General: Cooperative, alert, well developed, in no acute distress. HEENT: Conjunctivae and lids unremarkable. Cardiovascular: Regular rhythm.  Lungs: Normal work of breathing. Neurologic: No focal deficits.   Lab Results  Component Value Date   CREATININE 0.66 10/18/2021   BUN 13 10/18/2021   NA 140 10/18/2021   K 4.2 10/18/2021   CL 103 10/18/2021   CO2 24 10/18/2021   Lab Results  Component Value Date   ALT 12 10/18/2021   AST 16 10/18/2021   ALKPHOS 101 10/18/2021   BILITOT 0.4 10/18/2021   Lab Results  Component Value Date   HGBA1C 5.1 10/18/2021   HGBA1C 5.3 05/08/2021   Lab Results  Component Value Date   INSULIN 6.8 10/18/2021   INSULIN 21.1 05/08/2021   Lab Results  Component Value Date   TSH 2.060 05/08/2021   Lab Results  Component Value Date   CHOL 131 10/18/2021   HDL 41 10/18/2021  LDLCALC 74 10/18/2021   TRIG 79 10/18/2021   Lab Results  Component Value Date   VD25OH 47.1 10/18/2021   VD25OH 60.1 05/08/2021   Lab Results  Component Value Date   WBC 7.2 05/08/2021   HGB 15.1 05/08/2021   HCT 45.9 05/08/2021   MCV 88 05/08/2021   PLT 243 05/08/2021   No results found for: "IRON", "TIBC", "FERRITIN"  Attestation Statements:   Reviewed by clinician on day of visit: allergies, medications, problem list, medical history, surgical history, family history,  social history, and previous encounter notes.  I, Burt Knack, am acting as transcriptionist for Quillian Quince, MD.  I have reviewed the above documentation for accuracy and completeness, and I agree with the above. -  Quillian Quince, MD

## 2022-05-29 ENCOUNTER — Ambulatory Visit: Payer: 59 | Attending: Obstetrics and Gynecology | Admitting: Physical Therapy

## 2022-05-29 ENCOUNTER — Ambulatory Visit: Payer: 59

## 2022-05-29 DIAGNOSIS — R279 Unspecified lack of coordination: Secondary | ICD-10-CM | POA: Insufficient documentation

## 2022-05-29 DIAGNOSIS — M62838 Other muscle spasm: Secondary | ICD-10-CM | POA: Insufficient documentation

## 2022-05-29 DIAGNOSIS — R293 Abnormal posture: Secondary | ICD-10-CM | POA: Insufficient documentation

## 2022-05-29 DIAGNOSIS — M6281 Muscle weakness (generalized): Secondary | ICD-10-CM | POA: Insufficient documentation

## 2022-05-29 NOTE — Therapy (Signed)
OUTPATIENT PHYSICAL THERAPY TREATMENT NOTE   Patient Name: Leah Schneider MRN: [id number] DOB:1986/05/08, 36 y.o., female Today's Date: 05/29/2022  PCP: Kathyrn Lass, MD REFERRING PROVIDER: Allyn Kenner, D.O.  END OF SESSION:   PT End of Session - 05/29/22 0859     Visit Number 14    Date for PT Re-Evaluation 08/13/22    Authorization Type [employer]    PT Start Time [phone]    PT Stop Time 0857    PT Time Calculation (min) 38 min    Activity Tolerance Patient tolerated treatment well    Behavior During Therapy [facility] for tasks assessed/performed                Past Medical History:  Diagnosis Date   Back pain    Constipation    Edema of both lower legs    Headache    Herpes    IBS (irritable bowel syndrome)    Memory change    No pertinent past medical history    Thyroid condition    Past Surgical History:  Procedure Laterality Date   CESAREAN SECTION     CESAREAN SECTION  12/19/2011   Procedure: CESAREAN SECTION;  Surgeon: Jonnie Kind, MD;  Location: [facility];  Service: Gynecology;  Laterality: N/A;   EYE SURGERY     per patient "laser treatment to boyh eyes about 2-3 years ago in the summer"   THYROIDECTOMY Left 01/31/2020   Procedure: LEFT THYROID LOBECTOMY WITH FROZEN SECTION;  Surgeon: Izora Gala, MD;  Location: [facility];  Service: ENT;  Laterality: Left;   Patient Active Problem List   Diagnosis Date Noted   Depression 05/16/2022   Insulin resistance 11/19/2021   Polyphagia 07/11/2021   S/P thyroidectomy 01/31/2020   Migraine without aura 01/20/2017   Active labor 10/09/2013   Abnormal fetal ultrasound 12/15/2011   Small for dates affecting management of mother 12/15/2011   Abnormal genetic test in pregnancy 12/03/2011   Pregnancy, supervision of, high-risk 12/03/2011   HSV-2 infection complicating pregnancy 37/85/8850    REFERRING DIAG: Female stress incontinence - N39.3 stress incontinence (female)(female)  THERAPY DIAG:  Muscle weakness  (generalized)  Unspecified lack of coordination  Abnormal posture  PERTINENT HISTORY: Chronic constipation/IBS, c-section 11/29/2011, 2 vaginal deliveries with tearing   PRECAUTIONS: NA  SUBJECTIVE: pt reports she is not really having any pain internally anymore, does still have a hard time with pelvic floor contractions and is waiting on weights to come in.   PAIN:  Are you having pain? No    PATIENT GOALS to decrease urinary incontinence      PELVIC MMT:02/26/22:  Pelvic floor strength 1-2/5 Pelvic floor endurance 4 seconds Increased superficial and deep pelvic floor tension bil Grade 2 anterior vaginal wall laxity Tenderness with palpation of muscular restriction Urethral and bladder restriction Perineal scar tissue restriction and discomfort    05/29/22 : strength 1/5 for initially several reps, varies cues attempted but balloon breathing worked most consistently increasing strength to 2/5. Pt remained unable to hold contraction for longer than 1s each but improved.   TODAY'S TREATMENT  05/29/2022: NMRE: Pt consented to internal vaginal treatment, 1/5 initially, with max verbal cues for techniques to improve strength and consistency. Pt benefited most from balloon breathing and coordinated pelvic floor contraction with exhale. This increased strength consistently to 2/5.   Quick release used throughout for improved activation of pelvic floor as well.  After dressing completed exercises all with need of NMRE for coordination of pelvic floor,  breathing, and core activation to decrease stress at pelvic floor and less leakage.  2x10 bridges with ball squeezes 2x10 hip abduction with ball press 2x10 opp hand/knee ball press   RE-EVALUATION 05/21/22 Neuromuscular re-education: Pelvic floor contraction training: No emotional/communication barriers or cognitive limitation. Patient is motivated to learn. Patient understands and agrees with treatment goals and plan. PT explains  patient will be examined in standing, sitting, and lying down to see how their muscles and joints work. When they are ready, they will be asked to remove their underwear so PT can examine their perineum. The patient is also given the option of providing their own chaperone as one is not provided in our facility. The patient also has the right and is explained the right to defer or refuse any part of the evaluation or treatment including the internal exam. With the patient's consent, PT will use one gloved finger to gently assess the muscles of the pelvic floor, seeing how well it contracts and relaxes and if there is muscle symmetry. After, the patient will get dressed and PT and patient will discuss exam findings and plan of care. PT and patient discuss plan of care, schedule, attendance policy and HEP activities. Quick flick training with multimodal cues for improved motor control with vaginal feedback Self-care: Revisited importance of HEP compliance Pt ordered vaginal weights - discussed just using the smallest/lightest one for feedback rather than using for resistance which she does not need at this point; discussed various position lying down/quadruped to perform contractions with light weight Discussed role of good sleep habits and rest in strengthening Discussed that there is not targeted weight loss in certain body areas with exercise, only toning.       PATIENT EDUCATION:  Education details:  HEP Person educated: Patient Education method: Consulting civil engineer, Media planner, Corporate treasurer cues, Verbal cues, and Handouts Education comprehension: verbalized understanding     HOME EXERCISE PROGRAM: [id number]    ASSESSMENT:   CLINICAL IMPRESSION: Patient session focused on NMRE for pelvic floor retraining for activation consistently and improved feedback internally for pt to understand sensation of active pelvic floor contraction. Pt benefited from max cues and quick release to increase pt's  contractions. After this, pt completed exercises for NMRE  for core, pelvic floor, and breathing coordination with moderate cues throughout. Pt tolerated well but does still need cues for technique and NMRE for pelvic floor retraining. She will continue to benefit from skilled PT intervention in order to decrease urinary incontinence and progress functional strengthening.     OBJECTIVE IMPAIRMENTS decreased activity tolerance, decreased coordination, decreased endurance, decreased mobility, decreased ROM, decreased strength, hypomobility, increased fascial restrictions, increased muscle spasms, postural dysfunction, and pain.    ACTIVITY LIMITATIONS  exercise .    PERSONAL FACTORS 1-2 comorbidities: Chronic constipation/IBS, c-section 11/29/2011, 2 vaginal deliveries with tearing   are also affecting patient's functional outcome.      REHAB POTENTIAL: Good   CLINICAL DECISION MAKING: Stable/uncomplicated   EVALUATION COMPLEXITY: Low     GOALS: Goals reviewed with patient? Yes   SHORT TERM GOALS: Target date: 03/26/2022    Pt will be independent with HEP.    Baseline: Goal status: MET   2.  Pt will be independent with the knack and urge suppression technique in order to improve bladder habits and decrease urinary incontinence. Baseline:  Goal status: MET   3.  Pt will be able to correctly performing diaphragmatic breathing and appropriate pressure management in order to prevent worsening vaginal wall laxity and  improve pelvic floor A/ROM. Baseline:  Goal status: MET 04/17/22     LONG TERM GOALS: Target date: 05/21/2022 - updated 05/21/22   Pt will be independent with advanced HEP.    Baseline:  Goal status: IN PROGRESS   2.  Pt will report no leaks with laughing, coughing, sneezing, jumping, and exercise in order to improve comfort with interpersonal relationships and community activities Baseline:  Goal status: IN PROGRESS   3.  Pt will demonstrate normal pelvic floor muscle  tone and A/ROM, able to achieve 3/5 strength with contractions and 10 sec endurance, in order to provide appropriate lumbopelvic support in functional activities. Baseline: 1-2/5 strength, 4 second endurance Goal status: IN PROGRESS     PLAN: PT FREQUENCY: 1x/week - 1x/month   PT DURATION: 12 weeks   PLANNED INTERVENTIONS: Therapeutic exercises, Therapeutic activity, Neuromuscular re-education, Balance training, Gait training, Patient/Family education, Joint mobilization, Dry Needling, Biofeedback, and Manual therapy   PLAN FOR NEXT SESSION: Continue to progress pelvic floor motor control exercises. Possibly D/C depending on pt preference; other option to be seen 1x/month for help with compliance.  Stacy Gardner, PT, DPT 08/02/239:00 AM

## 2022-06-05 ENCOUNTER — Encounter (INDEPENDENT_AMBULATORY_CARE_PROVIDER_SITE_OTHER): Payer: Self-pay

## 2022-06-06 ENCOUNTER — Ambulatory Visit: Payer: 59 | Admitting: Physical Therapy

## 2022-06-06 DIAGNOSIS — M62838 Other muscle spasm: Secondary | ICD-10-CM

## 2022-06-06 DIAGNOSIS — M6281 Muscle weakness (generalized): Secondary | ICD-10-CM

## 2022-06-06 DIAGNOSIS — R279 Unspecified lack of coordination: Secondary | ICD-10-CM

## 2022-06-06 NOTE — Therapy (Signed)
OUTPATIENT PHYSICAL THERAPY TREATMENT NOTE   Patient Name: Leah Schneider MRN: [id number] DOB:12-13-1985, 36 y.o., female Today's Date: 06/06/2022  PCP: Kathyrn Lass, MD REFERRING PROVIDER: Allyn Kenner, D.O.  END OF SESSION:   PT End of Session - 06/06/22 0820     Visit Number 15    Date for PT Re-Evaluation 08/13/22    Authorization Type Aet[employer]    PT Start Time 0818   arrival time   PT Stop Time 0844    PT Time Calculation (min) 26 min    Activity Tolerance Patient tolerated treatment well    Behavior During Therapy [facility] for tasks assessed/performed                Past Medical History:  Diagnosis Date   Back pain    Constipation    Edema of both lower legs    Headache    Herpes    IBS (irritable bowel syndrome)    Memory change    No pertinent past medical history    Thyroid condition    Past Surgical History:  Procedure Laterality Date   CESAREAN SECTION     CESAREAN SECTION  12/19/2011   Procedure: CESAREAN SECTION;  Surgeon: Jonnie Kind, MD;  Location: [facility];  Service: Gynecology;  Laterality: N/A;   EYE SURGERY     per patient "laser treatment to boyh eyes about 2-3 years ago in the summer"   THYROIDECTOMY Left 01/31/2020   Procedure: LEFT THYROID LOBECTOMY WITH FROZEN SECTION;  Surgeon: Izora Gala, MD;  Location: [facility];  Service: ENT;  Laterality: Left;   Patient Active Problem List   Diagnosis Date Noted   Depression 05/16/2022   Insulin resistance 11/19/2021   Polyphagia 07/11/2021   S/P thyroidectomy 01/31/2020   Migraine without aura 01/20/2017   Active labor 10/09/2013   Abnormal fetal ultrasound 12/15/2011   Small for dates affecting management of mother 12/15/2011   Abnormal genetic test in pregnancy 12/03/2011   Pregnancy, supervision of, high-risk 12/03/2011   HSV-2 infection complicating pregnancy 62/13/0865    REFERRING DIAG: Female stress incontinence - N39.3 stress incontinence (female)(female)  THERAPY DIAG:  Muscle  weakness (generalized)  Unspecified lack of coordination  Other muscle spasm  PERTINENT HISTORY: Chronic constipation/IBS, c-section 11/29/2011, 2 vaginal deliveries with tearing   PRECAUTIONS: NA  SUBJECTIVE: pt reports she has had improvement with pain, having it less often and has been working on HEP  PAIN:  Are you having pain? No    PATIENT GOALS to decrease urinary incontinence      PELVIC MMT:02/26/22:  Pelvic floor strength 1-2/5 Pelvic floor endurance 4 seconds Increased superficial and deep pelvic floor tension bil Grade 2 anterior vaginal wall laxity Tenderness with palpation of muscular restriction Urethral and bladder restriction Perineal scar tissue restriction and discomfort    05/29/22 : strength 1/5 for initially several reps, varies cues attempted but balloon breathing worked most consistently increasing strength to 2/5. Pt remained unable to hold contraction for longer than 1s each but improved.   TODAY'S TREATMENT  06/06/2022: NMRE: 2x10 bridges with ball squeezes 2x10 opp hand/knee ball press 2x10 bridges with ball squeezes 3x5 Sit to standr from lowest mat table setting second set with attempts to kegel into standing  05/29/2022: NMRE: Pt consented to internal vaginal treatment, 1/5 initially, with max verbal cues for techniques to improve strength and consistency. Pt benefited most from balloon breathing and coordinated pelvic floor contraction with exhale. This increased strength consistently to 2/5.  Quick  release used throughout for improved activation of pelvic floor as well.  After dressing completed exercises all with need of NMRE for coordination of pelvic floor, breathing, and core activation to decrease stress at pelvic floor and less leakage.  2x10 bridges with ball squeezes 2x10 hip abduction with ball press 2x10 opp hand/knee ball press   RE-EVALUATION 05/21/22 Neuromuscular re-education: Pelvic floor contraction training: No  emotional/communication barriers or cognitive limitation. Patient is motivated to learn. Patient understands and agrees with treatment goals and plan. PT explains patient will be examined in standing, sitting, and lying down to see how their muscles and joints work. When they are ready, they will be asked to remove their underwear so PT can examine their perineum. The patient is also given the option of providing their own chaperone as one is not provided in our facility. The patient also has the right and is explained the right to defer or refuse any part of the evaluation or treatment including the internal exam. With the patient's consent, PT will use one gloved finger to gently assess the muscles of the pelvic floor, seeing how well it contracts and relaxes and if there is muscle symmetry. After, the patient will get dressed and PT and patient will discuss exam findings and plan of care. PT and patient discuss plan of care, schedule, attendance policy and HEP activities. Quick flick training with multimodal cues for improved motor control with vaginal feedback Self-care: Revisited importance of HEP compliance Pt ordered vaginal weights - discussed just using the smallest/lightest one for feedback rather than using for resistance which she does not need at this point; discussed various position lying down/quadruped to perform contractions with light weight Discussed role of good sleep habits and rest in strengthening Discussed that there is not targeted weight loss in certain body areas with exercise, only toning.       PATIENT EDUCATION:  Education details:  HEP Person educated: Patient Education method: Consulting civil engineer, Media planner, Corporate treasurer cues, Verbal cues, and Handouts Education comprehension: verbalized understanding     HOME EXERCISE PROGRAM: [id number]    ASSESSMENT:   CLINICAL IMPRESSION: Patient session focused on NMRE for pelvic floor retraining for activation, core, and breathing  coordination with moderate cues throughout. Pt tolerated well but does still need cues for technique and NMRE for pelvic floor retraining. Progressed slightly in attempting standing exercise and pt able to coordinate breathing with activity but unable to feel pelvic floor contraction I standing. Pt was limited slightly in session due to arriving late but very motivated to work on all tasks.  She will continue to benefit from skilled PT intervention in order to decrease urinary incontinence and progress functional strengthening.     OBJECTIVE IMPAIRMENTS decreased activity tolerance, decreased coordination, decreased endurance, decreased mobility, decreased ROM, decreased strength, hypomobility, increased fascial restrictions, increased muscle spasms, postural dysfunction, and pain.    ACTIVITY LIMITATIONS  exercise .    PERSONAL FACTORS 1-2 comorbidities: Chronic constipation/IBS, c-section 11/29/2011, 2 vaginal deliveries with tearing   are also affecting patient's functional outcome.      REHAB POTENTIAL: Good   CLINICAL DECISION MAKING: Stable/uncomplicated   EVALUATION COMPLEXITY: Low     GOALS: Goals reviewed with patient? Yes   SHORT TERM GOALS: Target date: 03/26/2022    Pt will be independent with HEP.    Baseline: Goal status: MET   2.  Pt will be independent with the knack and urge suppression technique in order to improve bladder habits and decrease urinary incontinence. Baseline:  Goal status: MET   3.  Pt will be able to correctly performing diaphragmatic breathing and appropriate pressure management in order to prevent worsening vaginal wall laxity and improve pelvic floor A/ROM. Baseline:  Goal status: MET 04/17/22     LONG TERM GOALS: Target date: 05/21/2022 - updated 05/21/22   Pt will be independent with advanced HEP.    Baseline:  Goal status: IN PROGRESS   2.  Pt will report no leaks with laughing, coughing, sneezing, jumping, and exercise in order to improve  comfort with interpersonal relationships and community activities Baseline:  Goal status: IN PROGRESS   3.  Pt will demonstrate normal pelvic floor muscle tone and A/ROM, able to achieve 3/5 strength with contractions and 10 sec endurance, in order to provide appropriate lumbopelvic support in functional activities. Baseline: 1-2/5 strength, 4 second endurance Goal status: IN PROGRESS     PLAN: PT FREQUENCY: 1x/week - 1x/month   PT DURATION: 12 weeks   PLANNED INTERVENTIONS: Therapeutic exercises, Therapeutic activity, Neuromuscular re-education, Balance training, Gait training, Patient/Family education, Joint mobilization, Dry Needling, Biofeedback, and Manual therapy   PLAN FOR NEXT SESSION: Continue to progress pelvic floor motor control exercises. Possibly D/C depending on pt preference; other option to be seen 1x/month for help with compliance.  Stacy Gardner, PT, DPT 08/10/238:46 AM

## 2022-06-10 ENCOUNTER — Ambulatory Visit (INDEPENDENT_AMBULATORY_CARE_PROVIDER_SITE_OTHER): Payer: Self-pay | Admitting: Family Medicine

## 2022-06-10 ENCOUNTER — Encounter (INDEPENDENT_AMBULATORY_CARE_PROVIDER_SITE_OTHER): Payer: Self-pay | Admitting: Family Medicine

## 2022-06-10 VITALS — BP 106/72 | HR 95 | Temp 98.0°F | Ht 65.0 in | Wt 183.0 lb

## 2022-06-10 DIAGNOSIS — E669 Obesity, unspecified: Secondary | ICD-10-CM

## 2022-06-10 DIAGNOSIS — E8881 Metabolic syndrome: Secondary | ICD-10-CM

## 2022-06-10 DIAGNOSIS — Z6830 Body mass index (BMI) 30.0-30.9, adult: Secondary | ICD-10-CM

## 2022-06-10 DIAGNOSIS — F3289 Other specified depressive episodes: Secondary | ICD-10-CM

## 2022-06-10 MED ORDER — BUPROPION HCL ER (SR) 200 MG PO TB12: 200.0000 mg | ORAL_TABLET | Freq: Every day | ORAL | 0 refills | Status: DC

## 2022-06-10 MED ORDER — METFORMIN HCL 500 MG PO TABS: ORAL_TABLET | ORAL | 0 refills | Status: DC

## 2022-06-18 NOTE — Progress Notes (Signed)
Chief Complaint:   OBESITY Leah Schneider is here to discuss her progress with her obesity treatment plan along with follow-up of her obesity related diagnoses. Leah Schneider is on the Category 2 Plan and states she is following her eating plan approximately 95% of the time. Leah Schneider states she is exercising with videos for 30 minutes 4-5 times per week.  Today's visit was #: 17 Starting weight: 241 lbs Starting date: 05/08/2021 Today's weight: 183 lbs Today's date: 06/10/2022 Total lbs lost to date: 58 Total lbs lost since last in-office visit: 2  Interim History: Leah Schneider continues to do well with weight loss. She has had some comfort eating recently, but she has done well with portion control. Her hunger is mostly controlled.   Subjective:   1. Insulin resistance Leah Schneider is doing well with her diet and metformin. She is not missing doses frequently.   2. Other depression with emotional eating Leah Schneider notes increased stress and some increased stress eating but she has done well with minimizing this. No side effects with her medications were mentioned.   Assessment/Plan:   1. Insulin resistance We will refill metformin for 1 month. Leah Schneider will continue to work on weight loss, exercise, and decreasing simple carbohydrates to help decrease the risk of diabetes. Leah Schneider agreed to follow-up with [place] as directed to closely monitor her progress.  - metFORMIN (GLUCOPHAGE) 500 MG tablet; TAKE 1 TABLET BY MOUTH TWICE DAILY WITH A MEAL  Strength: 500 mg  Dispense: 60 tablet; Refill: 0  2. Other depression with emotional eating We will refill Wellbutrin SR for 1 month. Behavior modification techniques were discussed today to help Leah Schneider deal with her emotional/non-hunger eating behaviors.  Orders and follow up as documented in patient record.   - buPROPion (WELLBUTRIN SR) 200 MG 12 hr tablet; Take 1 tablet (200 mg total) by mouth daily.  Dispense: 30 tablet; Refill: 0  3. Obesity, Current BMI  30.6 Leah Schneider is currently in the action stage of change. As such, her goal is to continue with weight loss efforts. She has agreed to the Category 2 Plan.   Exercise goals: As is.   Behavioral modification strategies: increasing water intake.  Leah Schneider has agreed to follow-up with our clinic in 3 to 4 weeks. She was informed of the importance of frequent follow-up visits to maximize her success with intensive lifestyle modifications for her multiple health conditions.   Objective:   Blood pressure 106/72, pulse 95, temperature 98 F (36.7 C), height 5\' 5"  (1.651 m), weight 183 lb (83 kg), SpO2 100 %, unknown if currently breastfeeding. Body mass index is 30.45 kg/m.  General: Cooperative, alert, well developed, in no acute distress. HEENT: Conjunctivae and lids unremarkable. Cardiovascular: Regular rhythm.  Lungs: Normal work of breathing. Neurologic: No focal deficits.   Lab Results  Component Value Date   CREATININE 0.66 10/18/2021   BUN 13 10/18/2021   NA 140 10/18/2021   K 4.2 10/18/2021   CL 103 10/18/2021   CO2 24 10/18/2021   Lab Results  Component Value Date   ALT 12 10/18/2021   AST 16 10/18/2021   ALKPHOS 101 10/18/2021   BILITOT 0.4 10/18/2021   Lab Results  Component Value Date   HGBA1C 5.1 10/18/2021   HGBA1C 5.3 05/08/2021   Lab Results  Component Value Date   INSULIN 6.8 10/18/2021   INSULIN 21.1 05/08/2021   Lab Results  Component Value Date   TSH 2.060 05/08/2021   Lab Results  Component Value Date  CHOL 131 10/18/2021   HDL 41 10/18/2021   LDLCALC 74 10/18/2021   TRIG 79 10/18/2021   Lab Results  Component Value Date   VD25OH 47.1 10/18/2021   VD25OH 60.1 05/08/2021   Lab Results  Component Value Date   WBC 7.2 05/08/2021   HGB 15.1 05/08/2021   HCT 45.9 05/08/2021   MCV 88 05/08/2021   PLT 243 05/08/2021   No results found for: "IRON", "TIBC", "FERRITIN"  Attestation Statements:   Reviewed by clinician on day of visit:  allergies, medications, problem list, medical history, surgical history, family history, social history, and previous encounter notes.   I, Burt Knack, am acting as transcriptionist for Quillian Quince, MD.  I have reviewed the above documentation for accuracy and completeness, and I agree with the above. -  Quillian Quince, MD

## 2022-07-04 ENCOUNTER — Ambulatory Visit: Payer: BC Managed Care – PPO | Attending: Obstetrics and Gynecology | Admitting: Physical Therapy

## 2022-07-04 DIAGNOSIS — R279 Unspecified lack of coordination: Secondary | ICD-10-CM | POA: Diagnosis present

## 2022-07-04 DIAGNOSIS — M62838 Other muscle spasm: Secondary | ICD-10-CM | POA: Insufficient documentation

## 2022-07-04 DIAGNOSIS — M6281 Muscle weakness (generalized): Secondary | ICD-10-CM | POA: Insufficient documentation

## 2022-07-04 NOTE — Therapy (Signed)
OUTPATIENT PHYSICAL THERAPY TREATMENT NOTE   Patient Name: Nadeen Shipman MRN: [id number] DOB:[birth year], 36 y.o., female Today's Date: 07/04/2022  PCP: Kathyrn Lass, MD REFERRING PROVIDER: Allyn Kenner, D.O.  END OF SESSION:   PT End of Session - 07/04/22 1618     Visit Number 16    Date for PT Re-Evaluation 08/13/22    Authorization Type [employer]    PT Start Time 1615    PT Stop Time 1655    PT Time Calculation (min) 40 min    Activity Tolerance Patient tolerated treatment well    Behavior During Therapy WFL for tasks assessed/performed                Past Medical History:  Diagnosis Date   Back pain    Constipation    Edema of both lower legs    Headache    Herpes    IBS (irritable bowel syndrome)    Memory change    No pertinent past medical history    Thyroid condition    Past Surgical History:  Procedure Laterality Date   CESAREAN SECTION     CESAREAN SECTION  12/19/2011   Procedure: CESAREAN SECTION;  Surgeon: Jonnie Kind, MD;  Location: [facility];  Service: Gynecology;  Laterality: N/A;   EYE SURGERY     per patient "laser treatment to boyh eyes about 2-3 years ago in the summer"   THYROIDECTOMY Left 01/31/2020   Procedure: LEFT THYROID LOBECTOMY WITH FROZEN SECTION;  Surgeon: Izora Gala, MD;  Location: [facility];  Service: ENT;  Laterality: Left;   Patient Active Problem List   Diagnosis Date Noted   Depression 05/16/2022   Insulin resistance 11/19/2021   Polyphagia 07/11/2021   S/P thyroidectomy 01/31/2020   Migraine without aura 01/20/2017   Active labor 10/09/2013   Abnormal fetal ultrasound 12/15/2011   Small for dates affecting management of mother 12/15/2011   Abnormal genetic test in pregnancy 12/03/2011   Pregnancy, supervision of, high-risk 12/03/2011   HSV-2 infection complicating pregnancy 28/31/5176    REFERRING DIAG: Female stress incontinence - N39.3 stress incontinence (female)(female)  THERAPY DIAG:  Muscle weakness  (generalized)  Unspecified lack of coordination  Other muscle spasm  PERTINENT HISTORY: Chronic constipation/IBS, c-section 11/29/2011, 2 vaginal deliveries with tearing   PRECAUTIONS: NA  SUBJECTIVE: pt reports leakage with jumping as the most amount and all other stressors are smaller amounts of leakage. Pain still same.   PAIN:  Are you having pain? No    PATIENT GOALS to decrease urinary incontinence      PELVIC MMT:02/26/22:  Pelvic floor strength 1-2/5 Pelvic floor endurance 4 seconds Increased superficial and deep pelvic floor tension bil Grade 2 anterior vaginal wall laxity Tenderness with palpation of muscular restriction Urethral and bladder restriction Perineal scar tissue restriction and discomfort    05/29/22 : strength 1/5 for initially several reps, varies cues attempted but balloon breathing worked most consistently increasing strength to 2/5. Pt remained unable to hold contraction for longer than 1s each but improved.   TODAY'S TREATMENT  07/04/22: NMRE: all exercises cued for pelvic floor and breathing mechanics to improve pelvic floor activation and strength as well as core activation and strength. 2x10 bridges with ball squeezes 2x10 opp hand/knee ball press Bird dogs 2x10 each 2X10 bear planks 2x10 Sit to stand from mat table Lateral stepping 2x15 each black loop X10 4 way hip black loop Wall pushups x10 Wall plank shoulder taps x10 each X10 lunges  06/06/2022: NMRE: 2x10  bridges with ball squeezes 2x10 opp hand/knee ball press 2x10 bridges with ball squeezes 3x5 Sit to standr from lowest mat table setting second set with attempts to kegel into standing  05/29/2022: NMRE: Pt consented to internal vaginal treatment, 1/5 initially, with max verbal cues for techniques to improve strength and consistency. Pt benefited most from balloon breathing and coordinated pelvic floor contraction with exhale. This increased strength consistently to 2/5.  Quick  release used throughout for improved activation of pelvic floor as well.  After dressing completed exercises all with need of NMRE for coordination of pelvic floor, breathing, and core activation to decrease stress at pelvic floor and less leakage.  2x10 bridges with ball squeezes 2x10 hip abduction with ball press 2x10 opp hand/knee ball press   RE-EVALUATION 05/21/22 Neuromuscular re-education: Pelvic floor contraction training: No emotional/communication barriers or cognitive limitation. Patient is motivated to learn. Patient understands and agrees with treatment goals and plan. PT explains patient will be examined in standing, sitting, and lying down to see how their muscles and joints work. When they are ready, they will be asked to remove their underwear so PT can examine their perineum. The patient is also given the option of providing their own chaperone as one is not provided in our facility. The patient also has the right and is explained the right to defer or refuse any part of the evaluation or treatment including the internal exam. With the patient's consent, PT will use one gloved finger to gently assess the muscles of the pelvic floor, seeing how well it contracts and relaxes and if there is muscle symmetry. After, the patient will get dressed and PT and patient will discuss exam findings and plan of care. PT and patient discuss plan of care, schedule, attendance policy and HEP activities. Quick flick training with multimodal cues for improved motor control with vaginal feedback Self-care: Revisited importance of HEP compliance Pt ordered vaginal weights - discussed just using the smallest/lightest one for feedback rather than using for resistance which she does not need at this point; discussed various position lying down/quadruped to perform contractions with light weight Discussed role of good sleep habits and rest in strengthening Discussed that there is not targeted weight loss in  certain body areas with exercise, only toning.       PATIENT EDUCATION:  Education details:  HEP Person educated: Patient Education method: Consulting civil engineer, Media planner, Corporate treasurer cues, Verbal cues, and Handouts Education comprehension: verbalized understanding     HOME EXERCISE PROGRAM: [id number]    ASSESSMENT:   CLINICAL IMPRESSION: Patient session focused on NMRE for pelvic floor retraining for activation, core, and breathing coordination with moderate cues throughout. Pt tolerated well but does still need cues for technique and NMRE for pelvic floor retraining. Progressed this session with more standing exercises though pt does report she is unable to feel of contraction in standing. She will continue to benefit from skilled PT intervention in order to decrease urinary incontinence and progress functional strengthening.     OBJECTIVE IMPAIRMENTS decreased activity tolerance, decreased coordination, decreased endurance, decreased mobility, decreased ROM, decreased strength, hypomobility, increased fascial restrictions, increased muscle spasms, postural dysfunction, and pain.    ACTIVITY LIMITATIONS  exercise .    PERSONAL FACTORS 1-2 comorbidities: Chronic constipation/IBS, c-section 11/29/2011, 2 vaginal deliveries with tearing   are also affecting patient's functional outcome.      REHAB POTENTIAL: Good   CLINICAL DECISION MAKING: Stable/uncomplicated   EVALUATION COMPLEXITY: Low     GOALS: Goals reviewed with  patient? Yes   SHORT TERM GOALS: Target date: 03/26/2022    Pt will be independent with HEP.    Baseline: Goal status: MET   2.  Pt will be independent with the knack and urge suppression technique in order to improve bladder habits and decrease urinary incontinence. Baseline:  Goal status: MET   3.  Pt will be able to correctly performing diaphragmatic breathing and appropriate pressure management in order to prevent worsening vaginal wall laxity and improve pelvic  floor A/ROM. Baseline:  Goal status: MET 04/17/22     LONG TERM GOALS: Target date: 05/21/2022 - updated 05/21/22   Pt will be independent with advanced HEP.    Baseline:  Goal status: IN PROGRESS   2.  Pt will report no leaks with laughing, coughing, sneezing, jumping, and exercise in order to improve comfort with interpersonal relationships and community activities Baseline:  Goal status: IN PROGRESS   3.  Pt will demonstrate normal pelvic floor muscle tone and A/ROM, able to achieve 3/5 strength with contractions and 10 sec endurance, in order to provide appropriate lumbopelvic support in functional activities. Baseline: 1-2/5 strength, 4 second endurance Goal status: IN PROGRESS     PLAN: PT FREQUENCY: 1x/week - 1x/month   PT DURATION: 12 weeks   PLANNED INTERVENTIONS: Therapeutic exercises, Therapeutic activity, Neuromuscular re-education, Balance training, Gait training, Patient/Family education, Joint mobilization, Dry Needling, Biofeedback, and Manual therapy   PLAN FOR NEXT SESSION: Continue to progress pelvic floor motor control exercises. Possibly D/C depending on pt preference; other option to be seen 1x/month for help with compliance.  Stacy Gardner, PT, DPT 09/07/234:59 PM

## 2022-07-05 ENCOUNTER — Other Ambulatory Visit (INDEPENDENT_AMBULATORY_CARE_PROVIDER_SITE_OTHER): Payer: Self-pay | Admitting: Family Medicine

## 2022-07-05 DIAGNOSIS — F3289 Other specified depressive episodes: Secondary | ICD-10-CM

## 2022-07-09 ENCOUNTER — Ambulatory Visit (INDEPENDENT_AMBULATORY_CARE_PROVIDER_SITE_OTHER): Payer: Self-pay | Admitting: Family Medicine

## 2022-07-11 ENCOUNTER — Ambulatory Visit: Payer: BC Managed Care – PPO | Admitting: Physical Therapy

## 2022-07-11 ENCOUNTER — Ambulatory Visit (INDEPENDENT_AMBULATORY_CARE_PROVIDER_SITE_OTHER): Payer: Self-pay | Admitting: Family Medicine

## 2022-07-11 DIAGNOSIS — M62838 Other muscle spasm: Secondary | ICD-10-CM

## 2022-07-11 DIAGNOSIS — M6281 Muscle weakness (generalized): Secondary | ICD-10-CM | POA: Diagnosis not present

## 2022-07-11 DIAGNOSIS — R279 Unspecified lack of coordination: Secondary | ICD-10-CM

## 2022-07-11 NOTE — Therapy (Signed)
OUTPATIENT PHYSICAL THERAPY TREATMENT NOTE   Patient Name: Leah Schneider MRN: [id number] DOB:01/15/1986, 36 y.o., female Today's Date: 07/11/2022  PCP: Kathyrn Lass, MD REFERRING PROVIDER: Allyn Kenner, D.O.  END OF SESSION:   PT End of Session - 07/11/22 1608     Visit Number 17    Date for PT Re-Evaluation 08/13/22    Authorization Type [employer]et[employer]    PT Start Time 1610    PT Stop Time 1655    PT Time Calculation (min) 45 min    Activity Tolerance Patient tolerated treatment well    Behavior During Therapy WFL for tasks assessed/performed                Past Medical History:  Diagnosis Date   Back pain    Constipation    Edema of both lower legs    Headache    Herpes    IBS (irritable bowel syndrome)    Memory change    No pertinent past medical history    Thyroid condition    Past Surgical History:  Procedure Laterality Date   CESAREAN SECTION     CESAREAN SECTION  12/19/2011   Procedure: CESAREAN SECTION;  Surgeon: Jonnie Kind, MD;  Location: [facility];  Service: Gynecology;  Laterality: N/A;   EYE SURGERY     per patient "laser treatment to boyh eyes about 2-3 years ago in the summer"   THYROIDECTOMY Left 01/31/2020   Procedure: LEFT THYROID LOBECTOMY WITH FROZEN SECTION;  Surgeon: Izora Gala, MD;  Location: [facility];  Service: ENT;  Laterality: Left;   Patient Active Problem List   Diagnosis Date Noted   Depression 05/16/2022   Insulin resistance 11/19/2021   Polyphagia 07/11/2021   S/P thyroidectomy 01/31/2020   Migraine without aura 01/20/2017   Active labor 10/09/2013   Abnormal fetal ultrasound 12/15/2011   Small for dates affecting management of mother 12/15/2011   Abnormal genetic test in pregnancy 12/03/2011   Pregnancy, supervision of, high-risk 12/03/2011   HSV-2 infection complicating pregnancy 98/92/1194    REFERRING DIAG: Female stress incontinence - N39.3 stress incontinence (female)(female)  THERAPY DIAG:  Other muscle  spasm  Unspecified lack of coordination  Muscle weakness (generalized)  PERTINENT HISTORY: Chronic constipation/IBS, c-section 11/29/2011, 2 vaginal deliveries with tearing   PRECAUTIONS: NA  SUBJECTIVE: pt reports leakage with jumping as the most amount and all other stressors are smaller amounts of leakage. Pain still same.   PAIN:  Are you having pain? No    PATIENT GOALS to decrease urinary incontinence      PELVIC MMT:02/26/22:  Pelvic floor strength 1-2/5 Pelvic floor endurance 4 seconds Increased superficial and deep pelvic floor tension bil Grade 2 anterior vaginal wall laxity Tenderness with palpation of muscular restriction Urethral and bladder restriction Perineal scar tissue restriction and discomfort    05/29/22 : strength 1/5 for initially several reps, varies cues attempted but balloon breathing worked most consistently increasing strength to 2/5. Pt remained unable to hold contraction for longer than 1s each but improved.   07/11/22: stretch 3/5 8/10 reps and 4/5 2/10 reps; 6s isometrics at 3/5 strength and 1s  4/5 strength   TODAY'S TREATMENT  07/11/2022: Pt consented to internal vaginal assessment/treatment this date and found to have continued improvement with strength today consisently seeing at least at 3/5 strength and 2 4/5 reps. Pt demonstrated ability to hold contraction for 6s which is also improved and attempted quick flicks with pt still needing increased time to contract pelvic floor  fully therefore slowed pace overall. NMRE: all exercises cued for pelvic floor and breathing mechanics to improve pelvic floor activation and strength as well as core activation and strength Elliptical 6 mins L3 for increased speed of standing activity for challenge at pelvic floor.  2x10 squats 15# Bear planks 2x10 Bear planks with alt knee extension x10  2x30s modified planks at mat table      07/04/22: NMRE: all exercises cued for pelvic floor and breathing  mechanics to improve pelvic floor activation and strength as well as core activation and strength. 2x10 bridges with ball squeezes 2x10 opp hand/knee ball press Bird dogs 2x10 each 2X10 bear planks 2x10 Sit to stand from mat table Lateral stepping 2x15 each black loop X10 4 way hip black loop Wall pushups x10 Wall plank shoulder taps x10 each X10 lunges      PATIENT EDUCATION:  Education details:  HEP Person educated: Patient Education method: Consulting civil engineer, Media planner, Corporate treasurer cues, Verbal cues, and Handouts Education comprehension: verbalized understanding     HOME EXERCISE PROGRAM: [id number]    ASSESSMENT:   CLINICAL IMPRESSION: Patient session focused on internal vaginal retesting and treatment and demonstrating improvement in strength and endurance consistently with all reps. NMRE for pelvic floor retraining for activation, core, and breathing coordination with minimal cues throughout. Pt tolerated well but does still need cues for technique and NMRE for pelvic floor retraining and core activation though improving. Progressed this session with more standing, quick motion and increased challenge of core exercises today. She will continue to benefit from skilled PT intervention in order to decrease urinary incontinence and progress functional strengthening.     OBJECTIVE IMPAIRMENTS decreased activity tolerance, decreased coordination, decreased endurance, decreased mobility, decreased ROM, decreased strength, hypomobility, increased fascial restrictions, increased muscle spasms, postural dysfunction, and pain.    ACTIVITY LIMITATIONS  exercise .    PERSONAL FACTORS 1-2 comorbidities: Chronic constipation/IBS, c-section 11/29/2011, 2 vaginal deliveries with tearing   are also affecting patient's functional outcome.      REHAB POTENTIAL: Good   CLINICAL DECISION MAKING: Stable/uncomplicated   EVALUATION COMPLEXITY: Low     GOALS: Goals reviewed with patient? Yes   SHORT  TERM GOALS: Target date: 03/26/2022    Pt will be independent with HEP.    Baseline: Goal status: MET   2.  Pt will be independent with the knack and urge suppression technique in order to improve bladder habits and decrease urinary incontinence. Baseline:  Goal status: MET   3.  Pt will be able to correctly performing diaphragmatic breathing and appropriate pressure management in order to prevent worsening vaginal wall laxity and improve pelvic floor A/ROM. Baseline:  Goal status: MET 04/17/22     LONG TERM GOALS: Target date: 05/21/2022 - updated 05/21/22   Pt will be independent with advanced HEP.    Baseline:  Goal status: IN PROGRESS   2.  Pt will report no leaks with laughing, coughing, sneezing, jumping, and exercise in order to improve comfort with interpersonal relationships and community activities Baseline:  Goal status: IN PROGRESS   3.  Pt will demonstrate normal pelvic floor muscle tone and A/ROM, able to achieve 3/5 strength with contractions and 10 sec endurance, in order to provide appropriate lumbopelvic support in functional activities. Baseline: 1-2/5 strength, 4 second endurance Goal status: IN PROGRESS     PLAN: PT FREQUENCY: 1x/week - 1x/month   PT DURATION: 12 weeks   PLANNED INTERVENTIONS: Therapeutic exercises, Therapeutic activity, Neuromuscular re-education, Balance training, Gait training, Patient/Family  education, Joint mobilization, Dry Needling, Biofeedback, and Manual therapy   PLAN FOR NEXT SESSION: Continue to progress pelvic floor motor control exercises. Possibly D/C depending on pt preference; other option to be seen 1x/month for help with compliance.  Stacy Gardner, PT, DPT 09/14/235:04 PM

## 2022-07-22 ENCOUNTER — Ambulatory Visit: Payer: BC Managed Care – PPO | Admitting: Physical Therapy

## 2022-07-22 DIAGNOSIS — M6281 Muscle weakness (generalized): Secondary | ICD-10-CM | POA: Diagnosis not present

## 2022-07-22 DIAGNOSIS — R279 Unspecified lack of coordination: Secondary | ICD-10-CM

## 2022-07-22 NOTE — Therapy (Signed)
OUTPATIENT PHYSICAL THERAPY TREATMENT NOTE   Patient Name: Leah Schneider MRN: [id number] DOB:26-May-1986, 36 y.o., female Today's Date: 07/22/2022  PCP: Kathyrn Lass, MD REFERRING PROVIDER: Allyn Kenner, D.O.  END OF SESSION:   PT End of Session - 07/22/22 1555     Visit Number 18   used one previously   Number of Visits 30    Date for PT Re-Evaluation 08/13/22    Authorization Type [employer]etna    PT Start Time 1555    PT Stop Time 1635    PT Time Calculation (min) 40 min    Activity Tolerance Patient tolerated treatment well    Behavior During Therapy WFL for tasks assessed/performed                Past Medical History:  Diagnosis Date   Back pain    Constipation    Edema of both lower legs    Headache    Herpes    IBS (irritable bowel syndrome)    Memory change    No pertinent past medical history    Thyroid condition    Past Surgical History:  Procedure Laterality Date   CESAREAN SECTION     CESAREAN SECTION  12/19/2011   Procedure: CESAREAN SECTION;  Surgeon: Jonnie Kind, MD;  Location: [facility];  Service: Gynecology;  Laterality: N/A;   EYE SURGERY     per patient "laser treatment to boyh eyes about 2-3 years ago in the summer"   THYROIDECTOMY Left 01/31/2020   Procedure: LEFT THYROID LOBECTOMY WITH FROZEN SECTION;  Surgeon: Izora Gala, MD;  Location: [facility];  Service: ENT;  Laterality: Left;   Patient Active Problem List   Diagnosis Date Noted   Depression 05/16/2022   Insulin resistance 11/19/2021   Polyphagia 07/11/2021   S/P thyroidectomy 01/31/2020   Migraine without aura 01/20/2017   Active labor 10/09/2013   Abnormal fetal ultrasound 12/15/2011   Small for dates affecting management of mother 12/15/2011   Abnormal genetic test in pregnancy 12/03/2011   Pregnancy, supervision of, high-risk 12/03/2011   HSV-2 infection complicating pregnancy 93/81/8299    REFERRING DIAG: Female stress incontinence - N39.3 stress incontinence  (female)(female)  THERAPY DIAG:  Muscle weakness (generalized)  Unspecified lack of coordination  PERTINENT HISTORY: Chronic constipation/IBS, c-section 11/29/2011, 2 vaginal deliveries with tearing   PRECAUTIONS: NA  SUBJECTIVE: Pt reports leakage has been varied some days has no leakage and some days has a little more, overall leakage is less frequent but the amount is about the same when leakage occurs.   PAIN:  Are you having pain? No    PATIENT GOALS to decrease urinary incontinence      PELVIC MMT:02/26/22:  Pelvic floor strength 1-2/5 Pelvic floor endurance 4 seconds Increased superficial and deep pelvic floor tension bil Grade 2 anterior vaginal wall laxity Tenderness with palpation of muscular restriction Urethral and bladder restriction Perineal scar tissue restriction and discomfort    05/29/22 : strength 1/5 for initially several reps, varies cues attempted but balloon breathing worked most consistently increasing strength to 2/5. Pt remained unable to hold contraction for longer than 1s each but improved.   07/11/22: stretch 3/5 8/10 reps and 4/5 2/10 reps; 6s isometrics at 3/5 strength and 1s  4/5 strength   TODAY'S TREATMENT  07/22/2022:  Elliptical 8 mins L5 for increased speed of standing activity for challenge at pelvic floor.  Squats 2x10 15# Bear Planks 2x10 X20 quick drops X10 mini hops X10 single leg stands from mat  table each  07/11/2022: Pt consented to internal vaginal assessment/treatment this date and found to have continued improvement with strength today consisently seeing at least at 3/5 strength and 2 4/5 reps. Pt demonstrated ability to hold contraction for 6s which is also improved and attempted quick flicks with pt still needing increased time to contract pelvic floor fully therefore slowed pace overall. NMRE: all exercises cued for pelvic floor and breathing mechanics to improve pelvic floor activation and strength as well as core activation  and strength Elliptical 6 mins L3 for increased speed of standing activity for challenge at pelvic floor.  2x10 squats 15# Bear planks 2x10 Bear planks with alt knee extension x10  2x30s modified planks at mat table          PATIENT EDUCATION:  Education details:  HEP Person educated: Patient Education method: Consulting civil engineer, Demonstration, Corporate treasurer cues, Verbal cues, and Handouts Education comprehension: verbalized understanding     HOME EXERCISE PROGRAM: [id number]    ASSESSMENT:   CLINICAL IMPRESSION: Patient session focused on hip and core strengthening, pt reports she is unable to feel if she is contracting pelvic floor in standing at this time but does attempt. Pt session increased challenge today with initiating jumping, increasing resistance and or reps of exercise and introducing single leg exercises. Pt tolerated well without leakage during session.  She will continue to benefit from skilled PT intervention in order to decrease urinary incontinence and progress functional strengthening.     OBJECTIVE IMPAIRMENTS decreased activity tolerance, decreased coordination, decreased endurance, decreased mobility, decreased ROM, decreased strength, hypomobility, increased fascial restrictions, increased muscle spasms, postural dysfunction, and pain.    ACTIVITY LIMITATIONS  exercise .    PERSONAL FACTORS 1-2 comorbidities: Chronic constipation/IBS, c-section 11/29/2011, 2 vaginal deliveries with tearing   are also affecting patient's functional outcome.      REHAB POTENTIAL: Good   CLINICAL DECISION MAKING: Stable/uncomplicated   EVALUATION COMPLEXITY: Low     GOALS: Goals reviewed with patient? Yes   SHORT TERM GOALS: Target date: 03/26/2022    Pt will be independent with HEP.    Baseline: Goal status: MET   2.  Pt will be independent with the knack and urge suppression technique in order to improve bladder habits and decrease urinary incontinence. Baseline:  Goal status:  MET   3.  Pt will be able to correctly performing diaphragmatic breathing and appropriate pressure management in order to prevent worsening vaginal wall laxity and improve pelvic floor A/ROM. Baseline:  Goal status: MET 04/17/22     LONG TERM GOALS: Target date: 05/21/2022 - updated 05/21/22   Pt will be independent with advanced HEP.    Baseline:  Goal status: IN PROGRESS   2.  Pt will report no leaks with laughing, coughing, sneezing, jumping, and exercise in order to improve comfort with interpersonal relationships and community activities Baseline:  Goal status: IN PROGRESS   3.  Pt will demonstrate normal pelvic floor muscle tone and A/ROM, able to achieve 3/5 strength with contractions and 10 sec endurance, in order to provide appropriate lumbopelvic support in functional activities. Baseline: 1-2/5 strength, 4 second endurance Goal status: IN PROGRESS     PLAN: PT FREQUENCY: 1x/week - 1x/month   PT DURATION: 12 weeks   PLANNED INTERVENTIONS: Therapeutic exercises, Therapeutic activity, Neuromuscular re-education, Balance training, Gait training, Patient/Family education, Joint mobilization, Dry Needling, Biofeedback, and Manual therapy   PLAN FOR NEXT SESSION: Continue to progress pelvic floor motor control exercises. Possibly D/C depending on pt preference;  other option to be seen 1x/month for help with compliance.  Stacy Gardner, PT, DPT 09/25/234:37 PM

## 2022-07-30 ENCOUNTER — Ambulatory Visit: Payer: BC Managed Care – PPO | Attending: Obstetrics and Gynecology | Admitting: Physical Therapy

## 2022-07-30 DIAGNOSIS — R293 Abnormal posture: Secondary | ICD-10-CM | POA: Insufficient documentation

## 2022-07-30 DIAGNOSIS — M6281 Muscle weakness (generalized): Secondary | ICD-10-CM | POA: Diagnosis present

## 2022-07-30 DIAGNOSIS — R279 Unspecified lack of coordination: Secondary | ICD-10-CM

## 2022-07-30 NOTE — Therapy (Signed)
OUTPATIENT PHYSICAL THERAPY TREATMENT NOTE   Patient Name: Leah Schneider MRN: [id number] DOB:10-18-1986, 36 y.o., female Today's Date: 07/30/2022  PCP: Kathyrn Lass, MD REFERRING PROVIDER: Allyn Kenner, D.O.  END OF SESSION:   PT End of Session - 07/30/22 1620     Visit Number 19    Number of Visits 30    Date for PT Re-Evaluation 08/13/22    Authorization Type Aet[employer]    PT Start Time 1615    PT Stop Time 1655    PT Time Calculation (min) 40 min    Activity Tolerance Patient tolerated treatment well    Behavior During Therapy WFL for tasks assessed/performed                Past Medical History:  Diagnosis Date   Back pain    Constipation    Edema of both lower legs    Headache    Herpes    IBS (irritable bowel syndrome)    Memory change    No pertinent past medical history    Thyroid condition    Past Surgical History:  Procedure Laterality Date   CESAREAN SECTION     CESAREAN SECTION  12/19/2011   Procedure: CESAREAN SECTION;  Surgeon: Jonnie Kind, MD;  Location: [facility];  Service: Gynecology;  Laterality: N/A;   EYE SURGERY     per patient "laser treatment to boyh eyes about 2-3 years ago in the summer"   THYROIDECTOMY Left 01/31/2020   Procedure: LEFT THYROID LOBECTOMY WITH FROZEN SECTION;  Surgeon: Izora Gala, MD;  Location: [facility];  Service: ENT;  Laterality: Left;   Patient Active Problem List   Diagnosis Date Noted   Depression 05/16/2022   Insulin resistance 11/19/2021   Polyphagia 07/11/2021   S/P thyroidectomy 01/31/2020   Migraine without aura 01/20/2017   Active labor 10/09/2013   Abnormal fetal ultrasound 12/15/2011   Small for dates affecting management of mother 12/15/2011   Abnormal genetic test in pregnancy 12/03/2011   Pregnancy, supervision of, high-risk 12/03/2011   HSV-2 infection complicating pregnancy 69/48/5462    REFERRING DIAG: Female stress incontinence - N39.3 stress incontinence (female)(female)  THERAPY DIAG:   Muscle weakness (generalized)  Unspecified lack of coordination  PERTINENT HISTORY: Chronic constipation/IBS, c-section 11/29/2011, 2 vaginal deliveries with tearing   PRECAUTIONS: NA  SUBJECTIVE: Pt reports leakage feels like its been better this week, less urine in her pad in the morning after working out which is majority of leakage noticed now. Pt does still have minor leakage with sneezing/laughing/coughing but less.   PAIN:  Are you having pain? No    PATIENT GOALS to decrease urinary incontinence      PELVIC MMT:02/26/22:  Pelvic floor strength 1-2/5 Pelvic floor endurance 4 seconds Increased superficial and deep pelvic floor tension bil Grade 2 anterior vaginal wall laxity Tenderness with palpation of muscular restriction Urethral and bladder restriction Perineal scar tissue restriction and discomfort    05/29/22 : strength 1/5 for initially several reps, varies cues attempted but balloon breathing worked most consistently increasing strength to 2/5. Pt remained unable to hold contraction for longer than 1s each but improved.   07/11/22: stretch 3/5 8/10 reps and 4/5 2/10 reps; 6s isometrics at 3/5 strength and 1s  4/5 strength   TODAY'S TREATMENT  07/30/22: Elliptical 8 mins L5 for increased speed of standing activity for challenge at pelvic floor.  Wide squats with one leg on bosu x15 each Skiers x20 Mini slit jumps 3x5 2x5 star jumps  X10 half jacks Heel drops x10 Mini hops x10 Lunges x10 Single leg Sit to stand 2x10 Donkey kicks into fire hydrants red loop x10 each  07/22/2022:  Elliptical 8 mins L5 for increased speed of standing activity for challenge at pelvic floor.  Squats 2x10 15# Bear Planks 2x10 X20 quick drops X10 mini hops X10 single leg stands from mat table each       PATIENT EDUCATION:  Education details:  HEP Person educated: Patient Education method: Consulting civil engineer, Demonstration, Corporate treasurer cues, Verbal cues, and Handouts Education  comprehension: verbalized understanding     HOME EXERCISE PROGRAM: [id number]    ASSESSMENT:   CLINICAL IMPRESSION: Patient session focused on hip and core strengthening, pt reports she is unable to feel if she is contracting pelvic floor in standing at this time but does attempt. Pt session increased challenge today with more jumping, increasing resistance and or reps of exercise and single leg exercises. Pt tolerated well without leakage during session.  She will continue to benefit from skilled PT intervention in order to decrease urinary incontinence and progress functional strengthening.     OBJECTIVE IMPAIRMENTS decreased activity tolerance, decreased coordination, decreased endurance, decreased mobility, decreased ROM, decreased strength, hypomobility, increased fascial restrictions, increased muscle spasms, postural dysfunction, and pain.    ACTIVITY LIMITATIONS  exercise .    PERSONAL FACTORS 1-2 comorbidities: Chronic constipation/IBS, c-section 11/29/2011, 2 vaginal deliveries with tearing   are also affecting patient's functional outcome.      REHAB POTENTIAL: Good   CLINICAL DECISION MAKING: Stable/uncomplicated   EVALUATION COMPLEXITY: Low     GOALS: Goals reviewed with patient? Yes   SHORT TERM GOALS: Target date: 03/26/2022    Pt will be independent with HEP.    Baseline: Goal status: MET   2.  Pt will be independent with the knack and urge suppression technique in order to improve bladder habits and decrease urinary incontinence. Baseline:  Goal status: MET   3.  Pt will be able to correctly performing diaphragmatic breathing and appropriate pressure management in order to prevent worsening vaginal wall laxity and improve pelvic floor A/ROM. Baseline:  Goal status: MET 04/17/22     LONG TERM GOALS: Target date: 05/21/2022 - updated 05/21/22   Pt will be independent with advanced HEP.    Baseline:  Goal status: IN PROGRESS   2.  Pt will report no leaks with  laughing, coughing, sneezing, jumping, and exercise in order to improve comfort with interpersonal relationships and community activities Baseline:  Goal status: IN PROGRESS   3.  Pt will demonstrate normal pelvic floor muscle tone and A/ROM, able to achieve 3/5 strength with contractions and 10 sec endurance, in order to provide appropriate lumbopelvic support in functional activities. Baseline: 1-2/5 strength, 4 second endurance Goal status: IN PROGRESS     PLAN: PT FREQUENCY: 1x/week - 1x/month   PT DURATION: 12 weeks   PLANNED INTERVENTIONS: Therapeutic exercises, Therapeutic activity, Neuromuscular re-education, Balance training, Gait training, Patient/Family education, Joint mobilization, Dry Needling, Biofeedback, and Manual therapy   PLAN FOR NEXT SESSION: Continue to progress pelvic floor motor control exercises. Possibly D/C depending on pt preference; other option to be seen 1x/month for help with compliance.  Stacy Gardner, PT, DPT 10/03/235:00 PM

## 2022-08-06 ENCOUNTER — Ambulatory Visit (INDEPENDENT_AMBULATORY_CARE_PROVIDER_SITE_OTHER): Payer: BC Managed Care – PPO | Admitting: Family Medicine

## 2022-08-06 ENCOUNTER — Encounter (INDEPENDENT_AMBULATORY_CARE_PROVIDER_SITE_OTHER): Payer: Self-pay | Admitting: Family Medicine

## 2022-08-06 ENCOUNTER — Ambulatory Visit: Payer: BC Managed Care – PPO | Admitting: Physical Therapy

## 2022-08-06 VITALS — BP 106/69 | HR 60 | Temp 97.7°F | Ht 65.0 in | Wt 179.0 lb

## 2022-08-06 DIAGNOSIS — R7303 Prediabetes: Secondary | ICD-10-CM

## 2022-08-06 DIAGNOSIS — E669 Obesity, unspecified: Secondary | ICD-10-CM

## 2022-08-06 DIAGNOSIS — Z6829 Body mass index (BMI) 29.0-29.9, adult: Secondary | ICD-10-CM

## 2022-08-06 DIAGNOSIS — F3289 Other specified depressive episodes: Secondary | ICD-10-CM

## 2022-08-06 DIAGNOSIS — E559 Vitamin D deficiency, unspecified: Secondary | ICD-10-CM | POA: Diagnosis not present

## 2022-08-06 MED ORDER — METFORMIN HCL 500 MG PO TABS: ORAL_TABLET | ORAL | 0 refills | Status: DC

## 2022-08-06 MED ORDER — BUPROPION HCL ER (SR) 200 MG PO TB12: 200.0000 mg | ORAL_TABLET | Freq: Every day | ORAL | 0 refills | Status: DC

## 2022-08-07 LAB — CMP14+EGFR
ALT: 13 IU/L (ref 0–32)
AST: 15 IU/L (ref 0–40)
Albumin/Globulin Ratio: 1.4 (ref 1.2–2.2)
Albumin: 3.9 g/dL (ref 3.9–4.9)
Alkaline Phosphatase: 85 IU/L (ref 44–121)
BUN/Creatinine Ratio: 16 (ref 9–23)
BUN: 11 mg/dL (ref 6–20)
Bilirubin Total: 0.3 mg/dL (ref 0.0–1.2)
CO2: 24 mmol/L (ref 20–29)
Calcium: 9.4 mg/dL (ref 8.7–10.2)
Chloride: 103 mmol/L (ref 96–106)
Creatinine, Ser: 0.7 mg/dL (ref 0.57–1.00)
Globulin, Total: 2.7 g/dL (ref 1.5–4.5)
Glucose: 80 mg/dL (ref 70–99)
Potassium: 4.4 mmol/L (ref 3.5–5.2)
Sodium: 142 mmol/L (ref 134–144)
Total Protein: 6.6 g/dL (ref 6.0–8.5)
eGFR: 113 mL/min/{1.73_m2} (ref >59)

## 2022-08-07 LAB — TSH: TSH: 2.22 u[IU]/mL (ref 0.450–4.500)

## 2022-08-07 LAB — LIPID PANEL WITH LDL/HDL RATIO
Cholesterol, Total: 142 mg/dL (ref 100–199)
HDL: 50 mg/dL (ref >39)
LDL Chol Calc (NIH): 75 mg/dL (ref 0–99)
LDL/HDL Ratio: 1.5 ratio (ref 0.0–3.2)
Triglycerides: 90 mg/dL (ref 0–149)
VLDL Cholesterol Cal: 17 mg/dL (ref 5–40)

## 2022-08-07 LAB — INSULIN, RANDOM: INSULIN: 5.5 u[IU]/mL (ref 2.6–24.9)

## 2022-08-07 LAB — HEMOGLOBIN A1C
Est. average glucose Bld gHb Est-mCnc: 97 mg/dL
Hgb A1c MFr Bld: 5 % (ref 4.8–5.6)

## 2022-08-07 LAB — VITAMIN D 25 HYDROXY (VIT D DEFICIENCY, FRACTURES): Vit D, 25-Hydroxy: 44.3 ng/mL (ref 30.0–100.0)

## 2022-08-13 ENCOUNTER — Ambulatory Visit: Payer: BC Managed Care – PPO | Admitting: Physical Therapy

## 2022-08-13 DIAGNOSIS — M6281 Muscle weakness (generalized): Secondary | ICD-10-CM

## 2022-08-13 DIAGNOSIS — R279 Unspecified lack of coordination: Secondary | ICD-10-CM

## 2022-08-13 DIAGNOSIS — R293 Abnormal posture: Secondary | ICD-10-CM

## 2022-08-13 NOTE — Progress Notes (Signed)
Chief Complaint:   OBESITY Leah Schneider is here to discuss her progress with her obesity treatment plan along with follow-up of her obesity related diagnoses. Leah Schneider is on the Category 2 Plan and states she is following her eating plan approximately 90% of the time. Leah Schneider states she is doing cardio and pilates for 30 minutes 6 times per week.  Today's visit was #: 18 Starting weight: 241 lbs Starting date: 05/08/2021 Today's weight: 179 lbs Today's date: 08/06/2022 Total lbs lost to date: 80 Total lbs lost since last in-office visit: 4  Interim History: Leah Schneider has done very well with weight loss.  She notes some increased stress and feeling guilty with eating at times.  She is struggling some with snacking, but otherwise she has done well.  Subjective:   1. Prediabetes Leah Schneider is doing well with weight loss.  She is taking metformin with no side effects noted.  2. Vitamin D deficiency Leah Schneider has no recent vitamin D level.  Her last level was at goal previously.  She is not currently taking vitamin D supplementation.  3. Other depression with emotional eating Leah Schneider is doing well overall with cravings.    Assessment/Plan:   1. Prediabetes We will check labs today.  Leah Schneider will continue with her diet, exercise, and metformin.  We will fill metformin for 1 month.  - metFORMIN (GLUCOPHAGE) 500 MG tablet; TAKE 1 TABLET BY MOUTH TWICE DAILY WITH A MEAL  Strength: 500 mg  Dispense: 60 tablet; Refill: 0 - CMP14+EGFR - Hemoglobin A1c - Insulin, random - Lipid Panel With LDL/HDL Ratio  2. Vitamin D deficiency We will check labs today. Leah Schneider will follow-up for routine testing of Vitamin D, at least 2-3 times per year to avoid over-replacement.  - VITAMIN D 25 Hydroxy (Vit-D Deficiency, Fractures)  3. Other depression with emotional eating Leah Schneider will continue Wellbutrin SR 200 mg once daily, and we will refill for 1 month.  Strategies for Halloween was discussed.  -  buPROPion (WELLBUTRIN SR) 200 MG 12 hr tablet; Take 1 tablet (200 mg total) by mouth daily.  Dispense: 30 tablet; Refill: 0 - TSH  4. Obesity, Current BMI 29.8 Kandy is currently in the action stage of change. As such, her goal is to continue with weight loss efforts. She has agreed to the Category 2 Plan.   Provided additional lunch options, and strategies for halloween was discussed.  Exercise goals: As is.   Behavioral modification strategies: increasing lean protein intake, decreasing simple carbohydrates, and avoiding temptations.  Leah Schneider has agreed to follow-up with our clinic in 3 weeks. She was informed of the importance of frequent follow-up visits to maximize her success with intensive lifestyle modifications for her multiple health conditions.   Leah Schneider was informed we would discuss her lab results at her next visit unless there is a critical issue that needs to be addressed sooner. Leah Schneider agreed to keep her next visit at the agreed upon time to discuss these results.  Objective:   Blood pressure 106/69, pulse 60, temperature 97.7 F (36.5 C), height _0  (1.651 m), weight 179 lb (81.2 kg), SpO2 100 %, unknown if currently breastfeeding. Body mass index is 29.79 kg/m.  General: Cooperative, alert, well developed, in no acute distress. HEENT: Conjunctivae and lids unremarkable. Cardiovascular: Regular rhythm.  Lungs: Normal work of breathing. Neurologic: No focal deficits.   Lab Results  Component Value Date   CREATININE 0.70 08/06/2022   BUN 11 08/06/2022   NA 142 08/06/2022  K 4.4 08/06/2022   CL 103 08/06/2022   CO2 24 08/06/2022   Lab Results  Component Value Date   ALT 13 08/06/2022   AST 15 08/06/2022   ALKPHOS 85 08/06/2022   BILITOT 0.3 08/06/2022   Lab Results  Component Value Date   HGBA1C 5.0 08/06/2022   HGBA1C 5.1 10/18/2021   HGBA1C 5.3 05/08/2021   Lab Results  Component Value Date   INSULIN 5.5 08/06/2022   INSULIN 6.8 10/18/2021    INSULIN 21.1 05/08/2021   Lab Results  Component Value Date   TSH 2.220 08/06/2022   Lab Results  Component Value Date   CHOL 142 08/06/2022   HDL 50 08/06/2022   LDLCALC 75 08/06/2022   TRIG 90 08/06/2022   Lab Results  Component Value Date   VD25OH 44.3 08/06/2022   VD25OH 47.1 10/18/2021   VD25OH 60.1 05/08/2021   Lab Results  Component Value Date   WBC 7.2 05/08/2021   HGB 15.1 05/08/2021   HCT 45.9 05/08/2021   MCV 88 05/08/2021   PLT 243 05/08/2021   No results found for: "IRON", "TIBC", "FERRITIN"  Attestation Statements:   Reviewed by clinician on day of visit: allergies, medications, problem list, medical history, surgical history, family history, social history, and previous encounter notes.   I, Trixie Dredge, am acting as transcriptionist for Dennard Nip, MD.  I have reviewed the above documentation for accuracy and completeness, and I agree with the above. -  Dennard Nip, MD

## 2022-08-13 NOTE — Therapy (Signed)
OUTPATIENT PHYSICAL THERAPY TREATMENT NOTE   Patient Name: Leah Schneider MRN: [id number] DOB:[birth year], 36 y.o., female Today's Date: 08/13/2022  PCP: Kathyrn Lass, MD REFERRING PROVIDER: Allyn Kenner, D.O.  END OF SESSION:   PT End of Session - 08/13/22 1635     Visit Number 20    Number of Visits 30    Date for PT Re-Evaluation 11/13/22    Authorization Type Aet[employer]    PT Start Time 1615    PT Stop Time 1655    PT Time Calculation (min) 40 min    Activity Tolerance Patient tolerated treatment well    Behavior During Therapy WFL for tasks assessed/performed                Past Medical History:  Diagnosis Date   Back pain    Constipation    Edema of both lower legs    Headache    Herpes    IBS (irritable bowel syndrome)    Memory change    No pertinent past medical history    Thyroid condition    Past Surgical History:  Procedure Laterality Date   CESAREAN SECTION     CESAREAN SECTION  12/19/2011   Procedure: CESAREAN SECTION;  Surgeon: Jonnie Kind, MD;  Location: [facility];  Service: Gynecology;  Laterality: N/A;   EYE SURGERY     per patient "laser treatment to boyh eyes about 2-3 years ago in the summer"   THYROIDECTOMY Left 01/31/2020   Procedure: LEFT THYROID LOBECTOMY WITH FROZEN SECTION;  Surgeon: Izora Gala, MD;  Location: [facility];  Service: ENT;  Laterality: Left;   Patient Active Problem List   Diagnosis Date Noted   Prediabetes 08/06/2022   Vitamin D deficiency 08/06/2022   Class 3 severe obesity with serious comorbidity and body mass index (BMI) of 40.0 to 44.9 in adult ([place]) 08/06/2022   Depression 05/16/2022   Insulin resistance 11/19/2021   Polyphagia 07/11/2021   S/P thyroidectomy 01/31/2020   Migraine without aura 01/20/2017   Active labor 10/09/2013   Abnormal fetal ultrasound 12/15/2011   Small for dates affecting management of mother 12/15/2011   Abnormal genetic test in pregnancy 12/03/2011   Pregnancy, supervision of,  high-risk 12/03/2011   HSV-2 infection complicating pregnancy 56/31/4970    REFERRING DIAG: Female stress incontinence - N39.3 stress incontinence (female)(female)  THERAPY DIAG:  Muscle weakness (generalized)  Unspecified lack of coordination  PERTINENT HISTORY: Chronic constipation/IBS, c-section 11/29/2011, 2 vaginal deliveries with tearing   PRECAUTIONS: NA  SUBJECTIVE: Pt reports leakage feels like its been better this week, less urine in her pad in the morning after working out which is majority of leakage noticed now. Pt does still have minor leakage with sneezing/laughing/coughing but less.   PAIN:  Are you having pain? No    PATIENT GOALS to decrease urinary incontinence      PELVIC MMT:02/26/22:  Pelvic floor strength 1-2/5 Pelvic floor endurance 4 seconds Increased superficial and deep pelvic floor tension bil Grade 2 anterior vaginal wall laxity Tenderness with palpation of muscular restriction Urethral and bladder restriction Perineal scar tissue restriction and discomfort    05/29/22 : strength 1/5 for initially several reps, varies cues attempted but balloon breathing worked most consistently increasing strength to 2/5. Pt remained unable to hold contraction for longer than 1s each but improved.   07/11/22: stretch 3/5 8/10 reps and 4/5 2/10 reps; 6s isometrics at 3/5 strength and 1s  4/5 strength   TODAY'S TREATMENT  08/13/22  Elliptical  10 mins L6 for increased speed of standing activity for challenge at pelvic floor. Wide squats with one leg on bosu x15 each Heel drops x10 Mini hops x10 Mini staggered jumps x10 each Star jumps x10 Single leg Sit to stand 2x10  07/30/22: Elliptical 8 mins L5 for increased speed of standing activity for challenge at pelvic floor.  Wide squats with one leg on bosu x15 each Skiers x20 Mini slit jumps 3x5 2x5 star jumps X10 half jacks Heel drops x10 Mini hops x10 Lunges x10 Single leg Sit to stand 2x10 Donkey kicks  into fire hydrants red loop x10 each  07/22/2022:  Elliptical 8 mins L5 for increased speed of standing activity for challenge at pelvic floor.  Squats 2x10 15# Bear Planks 2x10 X20 quick drops X10 mini hops X10 single leg stands from mat table each       PATIENT EDUCATION:  Education details:  HEP Person educated: Patient Education method: Consulting civil engineer, Demonstration, Corporate treasurer cues, Verbal cues, and Handouts Education comprehension: verbalized understanding     HOME EXERCISE PROGRAM: [id number]    ASSESSMENT:   CLINICAL IMPRESSION: Patient session focused on hip and core strengthening, able to feel contractions more in standing now and feels like she can complete with more strengthening activities now. Pt session increased challenge today with more jumping and in staggered stance, increasing resistance and or reps of exercise and single leg exercises. Pt tolerated well without leakage during session.  She will continue to benefit from skilled PT intervention in order to decrease urinary incontinence and progress functional strengthening.     OBJECTIVE IMPAIRMENTS decreased activity tolerance, decreased coordination, decreased endurance, decreased mobility, decreased ROM, decreased strength, hypomobility, increased fascial restrictions, increased muscle spasms, postural dysfunction, and pain.    ACTIVITY LIMITATIONS  exercise .    PERSONAL FACTORS 1-2 comorbidities: Chronic constipation/IBS, c-section 11/29/2011, 2 vaginal deliveries with tearing   are also affecting patient's functional outcome.      REHAB POTENTIAL: Good   CLINICAL DECISION MAKING: Stable/uncomplicated   EVALUATION COMPLEXITY: Low     GOALS: Goals reviewed with patient? Yes   SHORT TERM GOALS: Target date: 03/26/2022    Pt will be independent with HEP.    Baseline: Goal status: MET   2.  Pt will be independent with the knack and urge suppression technique in order to improve bladder habits and decrease  urinary incontinence. Baseline:  Goal status: MET   3.  Pt will be able to correctly performing diaphragmatic breathing and appropriate pressure management in order to prevent worsening vaginal wall laxity and improve pelvic floor A/ROM. Baseline:  Goal status: MET 04/17/22     LONG TERM GOALS: Target date: 05/21/2022 - updated 05/21/22   Pt will be independent with advanced HEP.    Baseline:  Goal status: IN PROGRESS   2.  Pt will report no leaks with laughing, coughing, sneezing, jumping, and exercise in order to improve comfort with interpersonal relationships and community activities Baseline:  Goal status: IN PROGRESS   3.  Pt will demonstrate normal pelvic floor muscle tone and A/ROM, able to achieve 3/5 strength with contractions and 10 sec endurance, in order to provide appropriate lumbopelvic support in functional activities. Baseline: 1-2/5 strength, 4 second endurance Goal status: IN PROGRESS     PLAN: PT FREQUENCY: 1x/week - 1x/month   PT DURATION: 12 weeks   PLANNED INTERVENTIONS: Therapeutic exercises, Therapeutic activity, Neuromuscular re-education, Balance training, Gait training, Patient/Family education, Joint mobilization, Dry Needling, Biofeedback, and Manual therapy  PLAN FOR NEXT SESSION: progressing stessors, core and hip strength, single leg strengthening, decrease frequency for carry over  Stacy Gardner, PT, DPT 10/17/234:59 PM

## 2022-08-28 ENCOUNTER — Ambulatory Visit (INDEPENDENT_AMBULATORY_CARE_PROVIDER_SITE_OTHER): Payer: BC Managed Care – PPO | Admitting: Family Medicine

## 2022-09-03 ENCOUNTER — Other Ambulatory Visit (INDEPENDENT_AMBULATORY_CARE_PROVIDER_SITE_OTHER): Payer: Self-pay | Admitting: Physician Assistant

## 2022-09-03 DIAGNOSIS — F3289 Other specified depressive episodes: Secondary | ICD-10-CM

## 2022-09-06 ENCOUNTER — Other Ambulatory Visit (INDEPENDENT_AMBULATORY_CARE_PROVIDER_SITE_OTHER): Payer: Self-pay | Admitting: Physician Assistant

## 2022-09-06 DIAGNOSIS — F3289 Other specified depressive episodes: Secondary | ICD-10-CM

## 2022-09-09 ENCOUNTER — Encounter (INDEPENDENT_AMBULATORY_CARE_PROVIDER_SITE_OTHER): Payer: Self-pay | Admitting: Family Medicine

## 2022-09-09 DIAGNOSIS — F3289 Other specified depressive episodes: Secondary | ICD-10-CM

## 2022-09-09 MED ORDER — BUPROPION HCL ER (SR) 200 MG PO TB12: 200.0000 mg | ORAL_TABLET | Freq: Every day | ORAL | 0 refills | Status: DC

## 2022-09-09 NOTE — Telephone Encounter (Signed)
Ok to rf x 1 (bupropion)

## 2022-09-09 NOTE — Telephone Encounter (Signed)
LAST APPOINTMENT DATE: 08/06/22 NEXT APPOINTMENT DATE: 09/23/22   [facility] [address] [place] - [address] [place] [address] Phone: [phone] Fax: [phone]  [facility] [place] [facility] [place], [place] - [address] [place] [address] Phone: [phone] Fax: [phone]  Patient is requesting a refill of the following medications: Requested Prescriptions   Signed Prescriptions Disp Refills   buPROPion (WELLBUTRIN SR) 200 MG 12 hr tablet 30 tablet 0    Sig: Take 1 tablet (200 mg total) by mouth daily.    Authorizing Provider: Quillian Quince D    Ordering User: Audley Hose S    Date last filled: 08/06/22 Previously prescribed by Dr. Dalbert Garnet  Lab Results  Component Value Date   HGBA1C 5.0 08/06/2022   HGBA1C 5.1 10/18/2021   HGBA1C 5.3 05/08/2021   Lab Results  Component Value Date   LDLCALC 75 08/06/2022   CREATININE 0.70 08/06/2022   Lab Results  Component Value Date   VD25OH 44.3 08/06/2022   VD25OH 47.1 10/18/2021   VD25OH 60.1 05/08/2021    BP Readings from Last 3 Encounters:  08/06/22 106/69  06/10/22 106/72  05/16/22 123/80

## 2022-09-18 ENCOUNTER — Ambulatory Visit: Payer: BC Managed Care – PPO | Attending: Obstetrics and Gynecology | Admitting: Physical Therapy

## 2022-09-18 DIAGNOSIS — M6281 Muscle weakness (generalized): Secondary | ICD-10-CM | POA: Diagnosis not present

## 2022-09-18 DIAGNOSIS — R279 Unspecified lack of coordination: Secondary | ICD-10-CM | POA: Diagnosis present

## 2022-09-18 DIAGNOSIS — R293 Abnormal posture: Secondary | ICD-10-CM | POA: Insufficient documentation

## 2022-09-18 NOTE — Therapy (Signed)
OUTPATIENT PHYSICAL THERAPY TREATMENT NOTE   Patient Name: Leah Schneider MRN: [id number] DOB:05/09/1986, 36 y.o., female Today's Date: 09/18/2022  PCP: Kathyrn Lass, MD REFERRING PROVIDER: Allyn Kenner, D.O.  END OF SESSION:   PT End of Session - 09/18/22 0802     Visit Number 21    Number of Visits 30    Date for PT Re-Evaluation 11/13/22    Authorization Type Aet[employer]    PT Start Time 0801    PT Stop Time 0839    PT Time Calculation (min) 38 min    Activity Tolerance Patient tolerated treatment well    Behavior During Therapy WFL for tasks assessed/performed                Past Medical History:  Diagnosis Date   Back pain    Constipation    Edema of both lower legs    Headache    Herpes    IBS (irritable bowel syndrome)    Memory change    No pertinent past medical history    Thyroid condition    Past Surgical History:  Procedure Laterality Date   CESAREAN SECTION     CESAREAN SECTION  12/19/2011   Procedure: CESAREAN SECTION;  Surgeon: Jonnie Kind, MD;  Location: [facility];  Service: Gynecology;  Laterality: N/A;   EYE SURGERY     per patient "laser treatment to boyh eyes about 2-3 years ago in the summer"   THYROIDECTOMY Left 01/31/2020   Procedure: LEFT THYROID LOBECTOMY WITH FROZEN SECTION;  Surgeon: Izora Gala, MD;  Location: [place];  Service: ENT;  Laterality: Left;   Patient Active Problem List   Diagnosis Date Noted   Prediabetes 08/06/2022   Vitamin D deficiency 08/06/2022   Class 3 severe obesity with serious comorbidity and body mass index (BMI) of 40.0 to 44.9 in adult ([place]) 08/06/2022   Depression 05/16/2022   Insulin resistance 11/19/2021   Polyphagia 07/11/2021   S/P thyroidectomy 01/31/2020   Migraine without aura 01/20/2017   Active labor 10/09/2013   Abnormal fetal ultrasound 12/15/2011   Small for dates affecting management of mother 12/15/2011   Abnormal genetic test in pregnancy 12/03/2011   Pregnancy, supervision of,  high-risk 12/03/2011   HSV-2 infection complicating pregnancy 26/83/4196    REFERRING DIAG: Female stress incontinence - N39.3 stress incontinence (female)(female)  THERAPY DIAG:  Muscle weakness (generalized)  Unspecified lack of coordination  Abnormal posture  PERTINENT HISTORY: Chronic constipation/IBS, c-section 11/29/2011, 2 vaginal deliveries with tearing   PRECAUTIONS: NA  SUBJECTIVE: Pt reports overall urinary leakage better and notices it a little worse around periods. Pt does have several days a week with no leakage even during exercise but closer to periods will have a little leakage. Coughing is improving with having instances without leakage.   PAIN:  Are you having pain? No    PATIENT GOALS to decrease urinary incontinence      PELVIC MMT:02/26/22:  Pelvic floor strength 1-2/5 Pelvic floor endurance 4 seconds Increased superficial and deep pelvic floor tension bil Grade 2 anterior vaginal wall laxity Tenderness with palpation of muscular restriction Urethral and bladder restriction Perineal scar tissue restriction and discomfort    05/29/22 : strength 1/5 for initially several reps, varies cues attempted but balloon breathing worked most consistently increasing strength to 2/5. Pt remained unable to hold contraction for longer than 1s each but improved.   07/11/22: stretch 3/5 8/10 reps and 4/5 2/10 reps; 6s isometrics at 3/5 strength and 1s  4/5  strength   TODAY'S TREATMENT  09/18/22: Body weight squats x10  Body weight lunges x10 each Heel drops quick x20  Lunges with front foot on bosu x10 each Quick steps at bosu 2x10 X10 hops X10 each split lunges Pencil jumps x15  Grape vines x5 20' Half kneel diagonals 8# 2x10 each      PATIENT EDUCATION:  Education details:  HEP Person educated: Patient Education method: Explanation, Demonstration, Tactile cues, Verbal cues, and Handouts Education comprehension: verbalized understanding     HOME EXERCISE  PROGRAM: [email or web address]    ASSESSMENT:   CLINICAL IMPRESSION: Patient session focused on hip and core strengthening, needed minimal cues for coordination today. Pt session increased challenge today with more jumping and in staggered stance, increasing weighted resistance. Pt tolerated well without leakage during session.  She will continue to benefit from skilled PT intervention in order to decrease urinary incontinence and progress functional strengthening.     OBJECTIVE IMPAIRMENTS decreased activity tolerance, decreased coordination, decreased endurance, decreased mobility, decreased ROM, decreased strength, hypomobility, increased fascial restrictions, increased muscle spasms, postural dysfunction, and pain.    ACTIVITY LIMITATIONS  exercise .    PERSONAL FACTORS 1-2 comorbidities: Chronic constipation/IBS, c-section 11/29/2011, 2 vaginal deliveries with tearing   are also affecting patient's functional outcome.      REHAB POTENTIAL: Good   CLINICAL DECISION MAKING: Stable/uncomplicated   EVALUATION COMPLEXITY: Low     GOALS: Goals reviewed with patient? Yes   SHORT TERM GOALS: Target date: 03/26/2022    Pt will be independent with HEP.    Baseline: Goal status: MET   2.  Pt will be independent with the knack and urge suppression technique in order to improve bladder habits and decrease urinary incontinence. Baseline:  Goal status: MET   3.  Pt will be able to correctly performing diaphragmatic breathing and appropriate pressure management in order to prevent worsening vaginal wall laxity and improve pelvic floor A/ROM. Baseline:  Goal status: MET 04/17/22     LONG TERM GOALS: Target date: 05/21/2022 - updated 05/21/22   Pt will be independent with advanced HEP.    Baseline:  Goal status: IN PROGRESS   2.  Pt will report no leaks with laughing, coughing, sneezing, jumping, and exercise in order to improve comfort with interpersonal relationships and community  activities Baseline:  Goal status: IN PROGRESS   3.  Pt will demonstrate normal pelvic floor muscle tone and A/ROM, able to achieve 3/5 strength with contractions and 10 sec endurance, in order to provide appropriate lumbopelvic support in functional activities. Baseline: 1-2/5 strength, 4 second endurance Goal status: IN PROGRESS     PLAN: PT FREQUENCY: 1x/week - 1x/month   PT DURATION: 12 weeks   PLANNED INTERVENTIONS: Therapeutic exercises, Therapeutic activity, Neuromuscular re-education, Balance training, Gait training, Patient/Family education, Joint mobilization, Dry Needling, Biofeedback, and Manual therapy   PLAN FOR NEXT SESSION: progressing stessors, core and hip strength, single leg strengthening, decrease frequency for carry over  Stacy Gardner, PT, DPT 11/22/238:40 AM

## 2022-09-18 NOTE — Patient Instructions (Signed)
[email or web address]   

## 2022-09-23 ENCOUNTER — Encounter (INDEPENDENT_AMBULATORY_CARE_PROVIDER_SITE_OTHER): Payer: Self-pay | Admitting: Family Medicine

## 2022-09-23 ENCOUNTER — Ambulatory Visit (INDEPENDENT_AMBULATORY_CARE_PROVIDER_SITE_OTHER): Payer: BC Managed Care – PPO | Admitting: Family Medicine

## 2022-09-23 VITALS — BP 113/76 | HR 73 | Temp 98.0°F | Ht 65.0 in | Wt 178.0 lb

## 2022-09-23 DIAGNOSIS — Z6829 Body mass index (BMI) 29.0-29.9, adult: Secondary | ICD-10-CM

## 2022-09-23 DIAGNOSIS — F3289 Other specified depressive episodes: Secondary | ICD-10-CM | POA: Diagnosis not present

## 2022-09-23 DIAGNOSIS — R7303 Prediabetes: Secondary | ICD-10-CM

## 2022-09-23 DIAGNOSIS — F439 Reaction to severe stress, unspecified: Secondary | ICD-10-CM

## 2022-09-23 DIAGNOSIS — E669 Obesity, unspecified: Secondary | ICD-10-CM | POA: Diagnosis not present

## 2022-09-23 MED ORDER — BUPROPION HCL ER (SR) 200 MG PO TB12: 200.0000 mg | ORAL_TABLET | Freq: Every day | ORAL | 1 refills | Status: DC

## 2022-09-23 MED ORDER — METFORMIN HCL 500 MG PO TABS: ORAL_TABLET | ORAL | 0 refills | Status: DC

## 2022-09-23 MED ORDER — BUPROPION HCL ER (SR) 200 MG PO TB12: 200.0000 mg | ORAL_TABLET | Freq: Every day | ORAL | 0 refills | Status: DC

## 2022-09-23 MED ORDER — METFORMIN HCL 500 MG PO TABS: ORAL_TABLET | ORAL | 1 refills | Status: DC

## 2022-09-26 ENCOUNTER — Encounter (INDEPENDENT_AMBULATORY_CARE_PROVIDER_SITE_OTHER): Payer: Self-pay | Admitting: Family Medicine

## 2022-09-26 MED ORDER — ESCITALOPRAM OXALATE 10 MG PO TABS: 10.0000 mg | ORAL_TABLET | Freq: Every day | ORAL | 1 refills | Status: DC

## 2022-09-26 MED ORDER — ESCITALOPRAM OXALATE 10 MG PO TABS: 10.0000 mg | ORAL_TABLET | Freq: Every day | ORAL | 0 refills | Status: DC

## 2022-09-26 NOTE — Telephone Encounter (Signed)
Metformin and wellbutrin are signed, was she thinking of another? If so, I need the facesheet from the visit back

## 2022-09-30 MED ORDER — ESCITALOPRAM OXALATE 10 MG PO TABS: 10.0000 mg | ORAL_TABLET | Freq: Every day | ORAL | 2 refills | Status: DC

## 2022-09-30 MED ORDER — METFORMIN HCL 500 MG PO TABS: ORAL_TABLET | ORAL | 2 refills | Status: DC

## 2022-09-30 MED ORDER — BUPROPION HCL ER (SR) 200 MG PO TB12: 200.0000 mg | ORAL_TABLET | Freq: Every day | ORAL | 2 refills | Status: DC

## 2022-10-01 NOTE — Progress Notes (Unsigned)
Chief Complaint:   OBESITY Leah Schneider is here to discuss her progress with her obesity treatment plan along with follow-up of her obesity related diagnoses. Leah Schneider is on the Category 2 Plan and states she is following her eating plan approximately 90% of the time. Leah Schneider states she is doing cardio and pilates for 30 minutes 5-6 times per week.  Today's visit was #: 19 Starting weight: 241 lbs Starting date: 05/08/2021 Today's weight: 178 lbs Today's date: 09/23/2022 Total lbs lost to date: 63 Total lbs lost since last in-office visit: 1  Interim History: Leah Schneider continues to do well with avoiding holiday weight gain, but she is struggling with increased cravings. She is getting 5-6 hours of sleep most nights.   Subjective:   1. Prediabetes Ashleymarie is stable on metformin with no side effects noted.   2. Other depression with emotional eating Leah Schneider has been dealing with increased stress recently, and she notes increased emotional eating behaviors.   Assessment/Plan:   1. Prediabetes We will refill metformin for 90 days. Abrial will continue to work on weight loss, exercise, and decreasing simple carbohydrates to help decrease the risk of diabetes.   - metFORMIN (GLUCOPHAGE) 500 MG tablet; TAKE 1 TABLET BY MOUTH TWICE DAILY WITH A MEAL  Strength: 500 mg  Dispense: 60 tablet; Refill: 2  2. Other depression with emotional eating Leah Schneider agreed to start Lexapro 10 mg q AM, with a 90 day supply with no refills; and we will refill Wellbutrin SR for 90 days.   - buPROPion (WELLBUTRIN SR) 200 MG 12 hr tablet; Take 1 tablet (200 mg total) by mouth daily.  Dispense: 30 tablet; Refill: 2 - escitalopram (LEXAPRO) 10 MG tablet; Take 1 tablet (10 mg total) by mouth daily.  Dispense: 30 tablet; Refill: 2  3. Obesity, Current BMI 29.8 Leah Schneider is currently in the action stage of change. As such, her goal is to continue with weight loss efforts. She has agreed to the Category 2 Plan.    Leah Schneider is to increase her sleep to 7 hours as often as possible.   Exercise goals: As is.   Behavioral modification strategies: increasing lean protein intake.  Leah Schneider has agreed to follow-up with our clinic in 6 weeks. She was informed of the importance of frequent follow-up visits to maximize her success with intensive lifestyle modifications for her multiple health conditions.   Objective:   Blood pressure 113/76, pulse 73, temperature 98 F (36.7 C), height 5\' 5"  (1.651 m), weight 178 lb (80.7 kg), SpO2 98 %, unknown if currently breastfeeding. Body mass index is 29.62 kg/m.  General: Cooperative, alert, well developed, in no acute distress. HEENT: Conjunctivae and lids unremarkable. Cardiovascular: Regular rhythm.  Lungs: Normal work of breathing. Neurologic: No focal deficits.   Lab Results  Component Value Date   CREATININE 0.70 08/06/2022   BUN 11 08/06/2022   NA 142 08/06/2022   K 4.4 08/06/2022   CL 103 08/06/2022   CO2 24 08/06/2022   Lab Results  Component Value Date   ALT 13 08/06/2022   AST 15 08/06/2022   ALKPHOS 85 08/06/2022   BILITOT 0.3 08/06/2022   Lab Results  Component Value Date   HGBA1C 5.0 08/06/2022   HGBA1C 5.1 10/18/2021   HGBA1C 5.3 05/08/2021   Lab Results  Component Value Date   INSULIN 5.5 08/06/2022   INSULIN 6.8 10/18/2021   INSULIN 21.1 05/08/2021   Lab Results  Component Value Date   TSH 2.220 08/06/2022  Lab Results  Component Value Date   CHOL 142 08/06/2022   HDL 50 08/06/2022   LDLCALC 75 08/06/2022   TRIG 90 08/06/2022   Lab Results  Component Value Date   VD25OH 44.3 08/06/2022   VD25OH 47.1 10/18/2021   VD25OH 60.1 05/08/2021   Lab Results  Component Value Date   WBC 7.2 05/08/2021   HGB 15.1 05/08/2021   HCT 45.9 05/08/2021   MCV 88 05/08/2021   PLT 243 05/08/2021   No results found for: "IRON", "TIBC", "FERRITIN"  Attestation Statements:   Reviewed by clinician on day of visit: allergies,  medications, problem list, medical history, surgical history, family history, social history, and previous encounter notes.  I have personally spent 43 minutes total time today in preparation, patient care, and documentation for this visit, including the following: review of clinical lab tests; review of medical tests/procedures/services.   I, Burt Knack, am acting as transcriptionist for Quillian Quince, MD.  I have reviewed the above documentation for accuracy and completeness, and I agree with the above. -  Quillian Quince, MD

## 2022-10-02 ENCOUNTER — Ambulatory Visit: Payer: BC Managed Care – PPO | Attending: Obstetrics and Gynecology | Admitting: Physical Therapy

## 2022-10-02 DIAGNOSIS — R279 Unspecified lack of coordination: Secondary | ICD-10-CM | POA: Diagnosis present

## 2022-10-02 DIAGNOSIS — M6281 Muscle weakness (generalized): Secondary | ICD-10-CM | POA: Insufficient documentation

## 2022-10-02 DIAGNOSIS — R293 Abnormal posture: Secondary | ICD-10-CM | POA: Insufficient documentation

## 2022-10-02 NOTE — Therapy (Signed)
OUTPATIENT PHYSICAL THERAPY TREATMENT NOTE   Patient Name: Leah Schneider MRN: [id number] DOB:1986-01-20, 36 y.o., female Today's Date: 10/02/2022  PCP: Kathyrn Lass, MD REFERRING PROVIDER: Allyn Kenner, D.O.  END OF SESSION:   PT End of Session - 10/02/22 1612     Visit Number 22    Number of Visits 30    Date for PT Re-Evaluation 11/13/22    Authorization Type [employer]et[employer]    PT Start Time 1612    PT Stop Time 1650    PT Time Calculation (min) 38 min    Activity Tolerance Patient tolerated treatment well    Behavior During Therapy WFL for tasks assessed/performed                Past Medical History:  Diagnosis Date   Back pain    Constipation    Edema of both lower legs    Headache    Herpes    IBS (irritable bowel syndrome)    Memory change    No pertinent past medical history    Thyroid condition    Past Surgical History:  Procedure Laterality Date   CESAREAN SECTION     CESAREAN SECTION  12/19/2011   Procedure: CESAREAN SECTION;  Surgeon: Jonnie Kind, MD;  Location: [facility];  Service: Gynecology;  Laterality: N/A;   EYE SURGERY     per patient "laser treatment to boyh eyes about 2-3 years ago in the summer"   THYROIDECTOMY Left 01/31/2020   Procedure: LEFT THYROID LOBECTOMY WITH FROZEN SECTION;  Surgeon: Izora Gala, MD;  Location: [place];  Service: ENT;  Laterality: Left;   Patient Active Problem List   Diagnosis Date Noted   Stress 09/23/2022   Prediabetes 08/06/2022   Vitamin D deficiency 08/06/2022   Class 3 severe obesity with serious comorbidity and body mass index (BMI) of 40.0 to 44.9 in adult ([place]) 08/06/2022   Depression 05/16/2022   Insulin resistance 11/19/2021   Polyphagia 07/11/2021   S/P thyroidectomy 01/31/2020   Migraine without aura 01/20/2017   Active labor 10/09/2013   Abnormal fetal ultrasound 12/15/2011   Small for dates affecting management of mother 12/15/2011   Abnormal genetic test in pregnancy 12/03/2011   Pregnancy,  supervision of, high-risk 12/03/2011   HSV-2 infection complicating pregnancy 97/35/3299    REFERRING DIAG: Female stress incontinence - N39.3 stress incontinence (female)(female)  THERAPY DIAG:  Muscle weakness (generalized)  Unspecified lack of coordination  Abnormal posture  PERTINENT HISTORY: Chronic constipation/IBS, c-section 11/29/2011, 2 vaginal deliveries with tearing   PRECAUTIONS: NA  SUBJECTIVE: Pt reports overall urinary leakage better and notices it a little worse around periods. Pt does have several days a week with no leakage even during exercise but closer to periods will have a little leakage. Coughing is improving with having instances without leakage.   PAIN:  Are you having pain? No    PATIENT GOALS to decrease urinary incontinence      PELVIC MMT:02/26/22:  Pelvic floor strength 1-2/5 Pelvic floor endurance 4 seconds Increased superficial and deep pelvic floor tension bil Grade 2 anterior vaginal wall laxity Tenderness with palpation of muscular restriction Urethral and bladder restriction Perineal scar tissue restriction and discomfort    05/29/22 : strength 1/5 for initially several reps, varies cues attempted but balloon breathing worked most consistently increasing strength to 2/5. Pt remained unable to hold contraction for longer than 1s each but improved.   07/11/22: strength 3/5 8/10 reps and 4/5 2/10 reps; 6s isometrics at 3/5 strength  and 1s  4/5 strength   10/02/22:   strength 4/5 9/10 reps  10s isometrics  TODAY'S TREATMENT  10/02/22 Pt consented to internal vaginal treatment this date and found to have continued improvement with strength today consisently seeing  4/5 strength for 9/10 reps. Pt did have initial rep of 3/5  but improved to 4/5 for remainder of treatment. X2 quick release completed at lateral pelvic floor with improved quick pace of activation. Pt demonstrated ability to hold contraction for 10s which is also improved and quick  flicks improved slightly but still overall slower in pace.  Post internal, pt redressed with privacy and PT discussed techniques to improve pace of quick flicks with counting out loud or visualizing flutters instead of full contract/relax each time. Pt verbalized understanding and with these cues did have slightly increased pace during treatment today.  Marland Kitchen      PATIENT EDUCATION:  Education details:  HEP Person educated: Patient Education method: Consulting civil engineer, Media planner, Corporate treasurer cues, Verbal cues, and Handouts Education comprehension: verbalized understanding     HOME EXERCISE PROGRAM: [id number]    ASSESSMENT:   CLINICAL IMPRESSION: Patient session focused on reassessing/treating pelvic floor internally. Pt has improvement with strength/endurance/coordination and educated on ways to improve quick flick pacing. Pt tolerated well without leakage during session.  She will continue to benefit from skilled PT intervention in order to decrease urinary incontinence and progress functional strengthening.     OBJECTIVE IMPAIRMENTS decreased activity tolerance, decreased coordination, decreased endurance, decreased mobility, decreased ROM, decreased strength, hypomobility, increased fascial restrictions, increased muscle spasms, postural dysfunction, and pain.    ACTIVITY LIMITATIONS  exercise .    PERSONAL FACTORS 1-2 comorbidities: Chronic constipation/IBS, c-section 11/29/2011, 2 vaginal deliveries with tearing   are also affecting patient's functional outcome.      REHAB POTENTIAL: Good   CLINICAL DECISION MAKING: Stable/uncomplicated   EVALUATION COMPLEXITY: Low     GOALS: Goals reviewed with patient? Yes   SHORT TERM GOALS: Target date: 03/26/2022    Pt will be independent with HEP.    Baseline: Goal status: MET   2.  Pt will be independent with the knack and urge suppression technique in order to improve bladder habits and decrease urinary incontinence. Baseline:  Goal  status: MET   3.  Pt will be able to correctly performing diaphragmatic breathing and appropriate pressure management in order to prevent worsening vaginal wall laxity and improve pelvic floor A/ROM. Baseline:  Goal status: MET 04/17/22     LONG TERM GOALS: Target date: 05/21/2022 - updated 05/21/22   Pt will be independent with advanced HEP.    Baseline:  Goal status:MET   2.  Pt will report no leaks with laughing, coughing, sneezing, jumping, and exercise in order to improve comfort with interpersonal relationships and community activities Baseline:  Goal status: IN PROGRESS   3.  Pt will demonstrate normal pelvic floor muscle tone and A/ROM, able to achieve 3/5 strength with contractions and 10 sec endurance, in order to provide appropriate lumbopelvic support in functional activities. Baseline: 1-2/5 strength, 4 second endurance Goal status: IN PROGRESS     PLAN: PT FREQUENCY: 1x/week - 1x/month   PT DURATION: 12 weeks   PLANNED INTERVENTIONS: Therapeutic exercises, Therapeutic activity, Neuromuscular re-education, Balance training, Gait training, Patient/Family education, Joint mobilization, Dry Needling, Biofeedback, and Manual therapy   PLAN FOR NEXT SESSION: progressing stessors, core and hip strength, single leg strengthening, decrease frequency for carry over  Stacy Gardner, PT, DPT 12/06/234:55 PM

## 2022-10-16 ENCOUNTER — Ambulatory Visit: Payer: BC Managed Care – PPO | Admitting: Physical Therapy

## 2022-10-16 DIAGNOSIS — R279 Unspecified lack of coordination: Secondary | ICD-10-CM

## 2022-10-16 DIAGNOSIS — M6281 Muscle weakness (generalized): Secondary | ICD-10-CM

## 2022-10-16 DIAGNOSIS — R293 Abnormal posture: Secondary | ICD-10-CM

## 2022-10-16 NOTE — Therapy (Signed)
OUTPATIENT PHYSICAL THERAPY TREATMENT NOTE   Patient Name: Leah Schneider MRN: [id number] DOB:09-Apr-1986, 36 y.o., female Today's Date: 10/16/2022  PCP: Kathyrn Lass, MD REFERRING PROVIDER: Allyn Kenner, D.O.  END OF SESSION:   PT End of Session - 10/16/22 0936     Visit Number 23    Number of Visits 30    Date for PT Re-Evaluation 11/13/22    Authorization Type [employer]    PT Start Time [phone]    PT Stop Time 1016    PT Time Calculation (min) 40 min    Activity Tolerance Patient tolerated treatment well    Behavior During Therapy [facility] for tasks assessed/performed                Past Medical History:  Diagnosis Date   Back pain    Constipation    Edema of both lower legs    Headache    Herpes    IBS (irritable bowel syndrome)    Memory change    No pertinent past medical history    Thyroid condition    Past Surgical History:  Procedure Laterality Date   CESAREAN SECTION     CESAREAN SECTION  12/19/2011   Procedure: CESAREAN SECTION;  Surgeon: Jonnie Kind, MD;  Location: [facility];  Service: Gynecology;  Laterality: N/A;   EYE SURGERY     per patient "laser treatment to boyh eyes about 2-3 years ago in the summer"   THYROIDECTOMY Left 01/31/2020   Procedure: LEFT THYROID LOBECTOMY WITH FROZEN SECTION;  Surgeon: Izora Gala, MD;  Location: [facility];  Service: ENT;  Laterality: Left;   Patient Active Problem List   Diagnosis Date Noted   Stress 09/23/2022   Prediabetes 08/06/2022   Vitamin D deficiency 08/06/2022   Class 3 severe obesity with serious comorbidity and body mass index (BMI) of 40.0 to 44.9 in adult [facility]) 08/06/2022   Depression 05/16/2022   Insulin resistance 11/19/2021   Polyphagia 07/11/2021   S/P thyroidectomy 01/31/2020   Migraine without aura 01/20/2017   Active labor 10/09/2013   Abnormal fetal ultrasound 12/15/2011   Small for dates affecting management of mother 12/15/2011   Abnormal genetic test in pregnancy 12/03/2011   Pregnancy,  supervision of, high-risk 12/03/2011   HSV-2 infection complicating pregnancy 66/59/9357    REFERRING DIAG: Female stress incontinence - N39.3 stress incontinence (female)(female)  THERAPY DIAG:  Muscle weakness (generalized)  Unspecified lack of coordination  Abnormal posture  PERTINENT HISTORY: Chronic constipation/IBS, c-section 11/29/2011, 2 vaginal deliveries with tearing   PRECAUTIONS: NA  SUBJECTIVE: Pt reports she hasn't had any leakage, has been continuing her workouts and did get sick but this didn't impact leakage, no leakage with working out.  PAIN:  Are you having pain? No    PATIENT GOALS to decrease urinary incontinence      PELVIC MMT:02/26/22:  Pelvic floor strength 1-2/5 Pelvic floor endurance 4 seconds Increased superficial and deep pelvic floor tension bil Grade 2 anterior vaginal wall laxity Tenderness with palpation of muscular restriction Urethral and bladder restriction Perineal scar tissue restriction and discomfort    05/29/22 : strength 1/5 for initially several reps, varies cues attempted but balloon breathing worked most consistently increasing strength to 2/5. Pt remained unable to hold contraction for longer than 1s each but improved.   07/11/22: strength 3/5 8/10 reps and 4/5 2/10 reps; 6s isometrics at 3/5 strength and 1s  4/5 strength   10/02/22:   strength 4/5 9/10 reps  10s isometrics  TODAY'S  TREATMENT  10/02/22: Lunges with front leg on bosu ball 2x10 each Quick lateral steps on bosu with hop to opp side x20  Squats into OHP 8# dumb bells 2x10 Dead lifts 8# each hand 2x10 Staggered stance 20# trunk rotation to front toe Palloffs 10# cables 2x10 each Rotational palloffs 10# 2x10 each       PATIENT EDUCATION:  Education details:  HEP Person educated: Patient Education method: Consulting civil engineer, Media planner, Corporate treasurer cues, Verbal cues, and Handouts Education comprehension: verbalized understanding     HOME EXERCISE  PROGRAM: [email or web address]    ASSESSMENT:   CLINICAL IMPRESSION: Patient session focused on hip and core strengthening with emphasis on pelvic floor coordination and decreased leakage, pt denied all leakage during session and demonstrated good technique without cues today. Pt has met all goals and denied additional questions or concerns for PT at this time. This will serve as pt's DC from PT post treatment today which pt is agreeable to.  OBJECTIVE IMPAIRMENTS decreased activity tolerance, decreased coordination, decreased endurance, decreased mobility, decreased ROM, decreased strength, hypomobility, increased fascial restrictions, increased muscle spasms, postural dysfunction, and pain.    ACTIVITY LIMITATIONS  exercise .    PERSONAL FACTORS 1-2 comorbidities: Chronic constipation/IBS, c-section 11/29/2011, 2 vaginal deliveries with tearing   are also affecting patient's functional outcome.      REHAB POTENTIAL: Good   CLINICAL DECISION MAKING: Stable/uncomplicated   EVALUATION COMPLEXITY: Low     GOALS: Goals reviewed with patient? Yes   SHORT TERM GOALS: Target date: 03/26/2022    Pt will be independent with HEP.    Baseline: Goal status: MET   2.  Pt will be independent with the knack and urge suppression technique in order to improve bladder habits and decrease urinary incontinence. Baseline:  Goal status: MET   3.  Pt will be able to correctly performing diaphragmatic breathing and appropriate pressure management in order to prevent worsening vaginal wall laxity and improve pelvic floor A/ROM. Baseline:  Goal status: MET 04/17/22     LONG TERM GOALS: Target date: 05/21/2022 - updated 05/21/22   Pt will be independent with advanced HEP.    Baseline:  Goal status:MET   2.  Pt will report no leaks with laughing, coughing, sneezing, jumping, and exercise in order to improve comfort with interpersonal relationships and community activities Baseline:  Goal status: MET   3.  Pt  will demonstrate normal pelvic floor muscle tone and A/ROM, able to achieve 3/5 strength with contractions and 10 sec endurance, in order to provide appropriate lumbopelvic support in functional activities. Baseline: 1-2/5 strength, 4 second endurance Goal status: MET     PLAN: PT FREQUENCY: 1x/week - 1x/month   PT DURATION: 12 weeks   PLANNED INTERVENTIONS: Therapeutic exercises, Therapeutic activity, Neuromuscular re-education, Balance training, Gait training, Patient/Family education, Joint mobilization, Dry Needling, Biofeedback, and Manual therapy   PLAN FOR NEXT SESSION:   PHYSICAL THERAPY DISCHARGE SUMMARY  Visits from Start of Care: 23  Current functional level related to goals / functional outcomes: All goals met   Remaining deficits: None per pt   Education / Equipment: HEP   Patient agrees to discharge. Patient goals were met. Patient is being discharged due to meeting the stated rehab goals.   Stacy Gardner, PT, DPT 10/16/2309:20 AM

## 2022-11-05 ENCOUNTER — Encounter (INDEPENDENT_AMBULATORY_CARE_PROVIDER_SITE_OTHER): Payer: Self-pay | Admitting: Family Medicine

## 2022-11-05 ENCOUNTER — Ambulatory Visit (INDEPENDENT_AMBULATORY_CARE_PROVIDER_SITE_OTHER): Payer: BC Managed Care – PPO | Admitting: Family Medicine

## 2022-11-05 VITALS — BP 116/76 | HR 63 | Temp 98.3°F | Ht 65.0 in | Wt 178.0 lb

## 2022-11-05 DIAGNOSIS — Z6829 Body mass index (BMI) 29.0-29.9, adult: Secondary | ICD-10-CM

## 2022-11-05 DIAGNOSIS — F3289 Other specified depressive episodes: Secondary | ICD-10-CM | POA: Diagnosis not present

## 2022-11-05 DIAGNOSIS — R7303 Prediabetes: Secondary | ICD-10-CM | POA: Diagnosis not present

## 2022-11-05 DIAGNOSIS — E669 Obesity, unspecified: Secondary | ICD-10-CM | POA: Diagnosis not present

## 2022-11-05 MED ORDER — BUPROPION HCL ER (SR) 200 MG PO TB12: 200.0000 mg | ORAL_TABLET | Freq: Every day | ORAL | 2 refills | Status: DC

## 2022-11-05 MED ORDER — METFORMIN HCL 500 MG PO TABS: ORAL_TABLET | ORAL | 2 refills | Status: DC

## 2022-11-05 MED ORDER — BUPROPION HCL ER (SR) 150 MG PO TB12: 150.0000 mg | ORAL_TABLET | Freq: Two times a day (BID) | ORAL | 0 refills | Status: DC

## 2022-11-05 MED ORDER — ESCITALOPRAM OXALATE 10 MG PO TABS: 10.0000 mg | ORAL_TABLET | Freq: Every day | ORAL | 2 refills | Status: DC

## 2022-11-13 ENCOUNTER — Encounter: Payer: BC Managed Care – PPO | Admitting: Physical Therapy

## 2022-11-18 NOTE — Progress Notes (Signed)
Chief Complaint:   OBESITY Leah Schneider is here to discuss her progress with her obesity treatment plan along with follow-up of her obesity related diagnoses. Leah Schneider is on the Category 2 Plan and states she is following her eating plan approximately 70% of the time. Leah Schneider states she is doing 0 minutes 0 times per week.  Today's visit was #: 20 Starting weight: 241 lbs Starting date: 05/08/2021 Today's weight: 178 lbs Today's date: 11/05/2022 Total lbs lost to date: 34 Total lbs lost since last in-office visit: 0  Interim History: Leah Schneider did well with maintaining her weight over the holiday. She is getting back on track with her eating plan. She notes increased cravings in the afternoon and evenings. She did indulge in sweets over the holidays. Her ultimate goal weight is 140 lbs.   Subjective:   1. Prediabetes Leah Schneider is on metformin 500 mg BID with no side effects noted. She is working on decreasing simple carbohydrates and increasing lean protein.   2. Emotional Eating Behavior Leah Schneider is on Wellbutrin SR 200 mg once daily and Lexapro 10 mg once daily. She notes increased cravings, especially for sweets in the afternoon and evenings. We discussed the use for naltrexone, risks and benefits. She has an IUD for contraceptive.   Assessment/Plan:   1. Prediabetes Leah Schneider will continue metformin and we will refill for 90 days. She will continue with her eating plan and exercise to promote weight loss.   - metFORMIN (GLUCOPHAGE) 500 MG tablet; TAKE 1 TABLET BY MOUTH TWICE DAILY WITH A MEAL  Strength: 500 mg  Dispense: 60 tablet; Refill: 2  2. Emotional Eating Behavior Leah Schneider agreed to increase Wellbutrin SR to 150 mg BID with no refills, and we will refill Lexapro for 90 days. We discussed not to take her last dose of Wellbutrin later than 10 pm.   - escitalopram (LEXAPRO) 10 MG tablet; Take 1 tablet (10 mg total) by mouth daily.  Dispense: 30 tablet; Refill: 2 - buPROPion  (WELLBUTRIN SR) 150 MG 12 hr tablet; Take 1 tablet (150 mg total) by mouth 2 (two) times daily.  Dispense: 60 tablet; Refill: 0  3. Obesity, Current BMI 29.7 [name] is currently in the action stage of change. As such, her goal is to continue with weight loss efforts. She has agreed to the Category 2 Plan and keeping a food journal and adhering to recommended goals of 400-500 calories and 35+ grams of protein at supper daily.   We discussed options for dinner with [email or web address]  Behavioral modification strategies: increasing lean protein intake, decreasing simple carbohydrates, and meal planning and cooking strategies.  Leah Schneider has agreed to follow-up with our clinic in 4 weeks. She was informed of the importance of frequent follow-up visits to maximize her success with intensive lifestyle modifications for her multiple health conditions.   Objective:   Blood pressure 116/76, pulse 63, temperature 98.3 F (36.8 C), height 5\' 5"  (1.651 m), weight 178 lb (80.7 kg), SpO2 98 %, unknown if currently breastfeeding. Body mass index is 29.62 kg/m.  General: Cooperative, alert, well developed, in no acute distress. HEENT: Conjunctivae and lids unremarkable. Cardiovascular: Regular rhythm.  Lungs: Normal work of breathing. Neurologic: No focal deficits.   Lab Results  Component Value Date   CREATININE 0.70 08/06/2022   BUN 11 08/06/2022   NA 142 08/06/2022   K 4.4 08/06/2022   CL 103 08/06/2022   CO2 24 08/06/2022   Lab Results  Component Value Date   ALT 13  08/06/2022   AST 15 08/06/2022   ALKPHOS 85 08/06/2022   BILITOT 0.3 08/06/2022   Lab Results  Component Value Date   HGBA1C 5.0 08/06/2022   HGBA1C 5.1 10/18/2021   HGBA1C 5.3 05/08/2021   Lab Results  Component Value Date   INSULIN 5.5 08/06/2022   INSULIN 6.8 10/18/2021   INSULIN 21.1 05/08/2021   Lab Results  Component Value Date   TSH 2.220 08/06/2022   Lab Results  Component Value Date   CHOL 142 08/06/2022    HDL 50 08/06/2022   LDLCALC 75 08/06/2022   TRIG 90 08/06/2022   Lab Results  Component Value Date   VD25OH 44.3 08/06/2022   VD25OH 47.1 10/18/2021   VD25OH 60.1 05/08/2021   Lab Results  Component Value Date   WBC 7.2 05/08/2021   HGB 15.1 05/08/2021   HCT 45.9 05/08/2021   MCV 88 05/08/2021   PLT 243 05/08/2021   No results found for: "IRON", "TIBC", "FERRITIN"  Attestation Statements:   Reviewed by clinician on day of visit: allergies, medications, problem list, medical history, surgical history, family history, social history, and previous encounter notes.   I, Trixie Dredge, am acting as transcriptionist for Dennard Nip, MD.  I have reviewed the above documentation for accuracy and completeness, and I agree with the above. -  Dennard Nip, MD

## 2022-12-02 NOTE — Progress Notes (Signed)
TeleHealth Visit:  This visit was completed with telemedicine (audio/video) technology. Leah Schneider has verbally consented to this TeleHealth visit. The patient is located at home, the provider is located at home. The participants in this visit include the listed provider and patient. The visit was conducted today via MyChart video.  OBESITY Leah Schneider is here to discuss her progress with her obesity treatment plan along with follow-up of her obesity related diagnoses.   Today's visit was # 21 Starting weight: 241 lbs Starting date: 05/08/2021 Weight at last in office visit: 178 lbs on 11/05/22 Total weight loss: 63 lbs at last in office visit on 11/05/22. Today's reported weight: No weight reported.  Category 2 Plan and keeping a food journal and adhering to recommended goals of 400-500 calories and 35+ grams of protein at supper daily.     Nutrition Plan: Category 2 Plan and keeping a food journal and adhering to recommended goals of 400-500 calories and 35+ grams of protein at supper daily.   Current exercise: 30 minutes exercise video-has been sporadic since the holidays.  Interim History:  Leah Schneider has made great progress in our program and is lost 63 pounds since July 2022.  She is a bit frustrated because her weight loss has been very slow over the last several months.  She admits that she probably is not following the plan as tightly as she was in the beginning. She focuses on trying to get all of her protein in.  Sometimes skips lunch on the weekend  because she is very busy. Planning for dinner is a challenge because her kids are involved in sports.  Before the holidays she was getting up at 3 AM and exercising 5 days/week but she has not been able to get back in the routine.  Appetite well-controlled but reports sweets cravings.  Her daughter likes Marcell Barlow in her lunch so these are readily available. Assessment/Plan:  1. Prediabetes Last A1c was 5 on 08/06/2022. Medication(s):  Metformin 500 mg twice daily with meals.  Tolerating well. Denies polyphagia Lab Results  Component Value Date   HGBA1C 5.0 08/06/2022   Lab Results  Component Value Date   INSULIN 5.5 08/06/2022   INSULIN 6.8 10/18/2021   INSULIN 21.1 05/08/2021    Plan: Refill metformin 500 mg twice daily with meal. Recheck A1c within the next 2 months.   2.  Emotional eating behavior Has issues with sweets cravings.  This has been ongoing since Christmas when she had a lot of sweets in her house. Currently this is poorly controlled. Overall mood is stable despite having a lot of stress at home currently. Bupropion dose increased to twice daily last visit.  She feels this helped a little with cravings. . Medication(s): Lexapro 10 mg daily, bupropion 150 mg twice daily.  Plan: Refill Lexapro 10 mg daily Refill bupropion 150 mg twice daily Advised her to go "cold [place]" off of sweets for a week to see if that is put a stop to her cravings. Discussed strategies for keeping sweets out of her line of sight at home.  3. Obesity: Current BMI 29 Leah Schneider is currently in the action stage of change. As such, her goal is to continue with weight loss efforts.  She has agreed to Category 2 Plan and keeping a food journal and adhering to recommended goals of 400-500 calories and 35+ grams of protein at supper daily. .   Exercise goals: She will do her exercise video 3 times per week until her next visit.  Behavioral modification strategies: increasing lean protein intake, decreasing simple carbohydrates, meal planning and cooking strategies, emotional eating strategies, dealing with family or coworker sabotage, avoiding temptations, and planning for success.  Leah Schneider has agreed to follow-up with our clinic in 4 weeks.   No orders of the defined types were placed in this encounter.   Medications Discontinued During This Encounter  Medication Reason   escitalopram (LEXAPRO) 10 MG tablet Reorder    metFORMIN (GLUCOPHAGE) 500 MG tablet Reorder   buPROPion (WELLBUTRIN SR) 150 MG 12 hr tablet Reorder     Meds ordered this encounter  Medications   buPROPion (WELLBUTRIN SR) 150 MG 12 hr tablet    Sig: Take 1 tablet (150 mg total) by mouth 2 (two) times daily.    Dispense:  60 tablet    Refill:  0    Order Specific Question:   Supervising Provider    Answer:   Dell Ponto [[id number]]   escitalopram (LEXAPRO) 10 MG tablet    Sig: Take 1 tablet (10 mg total) by mouth daily.    Dispense:  30 tablet    Refill:  2    PLEASE DISREGARD PREVIOUS RX    Order Specific Question:   Supervising Provider    Answer:   Dell Ponto [[id number]]   metFORMIN (GLUCOPHAGE) 500 MG tablet    Sig: TAKE 1 TABLET BY MOUTH TWICE DAILY WITH A MEAL  Strength: 500 mg    Dispense:  60 tablet    Refill:  2    PLEASE DISREGARD PREVIOUS RX    Order Specific Question:   Supervising Provider    Answer:   Dell Ponto [[id number]]      Objective:   VITALS: Per patient if applicable, see vitals. GENERAL: Alert and in no acute distress. CARDIOPULMONARY: No increased WOB. Speaking in clear sentences.  PSYCH: Pleasant and cooperative. Speech normal rate and rhythm. Affect is appropriate. Insight and judgement are appropriate. Attention is focused, linear, and appropriate.  NEURO: Oriented as arrived to appointment on time with no prompting.   Lab Results  Component Value Date   CREATININE 0.70 08/06/2022   BUN 11 08/06/2022   NA 142 08/06/2022   K 4.4 08/06/2022   CL 103 08/06/2022   CO2 24 08/06/2022   Lab Results  Component Value Date   ALT 13 08/06/2022   AST 15 08/06/2022   ALKPHOS 85 08/06/2022   BILITOT 0.3 08/06/2022   Lab Results  Component Value Date   HGBA1C 5.0 08/06/2022   HGBA1C 5.1 10/18/2021   HGBA1C 5.3 05/08/2021   Lab Results  Component Value Date   INSULIN 5.5 08/06/2022   INSULIN 6.8 10/18/2021   INSULIN 21.1 05/08/2021   Lab Results  Component Value Date   TSH 2.220 08/06/2022    Lab Results  Component Value Date   CHOL 142 08/06/2022   HDL 50 08/06/2022   LDLCALC 75 08/06/2022   TRIG 90 08/06/2022   Lab Results  Component Value Date   WBC 7.2 05/08/2021   HGB 15.1 05/08/2021   HCT 45.9 05/08/2021   MCV 88 05/08/2021   PLT 243 05/08/2021   No results found for: "IRON", "TIBC", "FERRITIN" Lab Results  Component Value Date   VD25OH 44.3 08/06/2022   VD25OH 47.1 10/18/2021   VD25OH 60.1 05/08/2021    Attestation Statements:   Reviewed by clinician on day of visit: allergies, medications, problem list, medical history, surgical history, family history, social history, and previous encounter notes.

## 2022-12-03 ENCOUNTER — Telehealth (INDEPENDENT_AMBULATORY_CARE_PROVIDER_SITE_OTHER): Payer: BC Managed Care – PPO | Admitting: Family Medicine

## 2022-12-03 ENCOUNTER — Encounter (INDEPENDENT_AMBULATORY_CARE_PROVIDER_SITE_OTHER): Payer: Self-pay | Admitting: Family Medicine

## 2022-12-03 DIAGNOSIS — E669 Obesity, unspecified: Secondary | ICD-10-CM | POA: Diagnosis not present

## 2022-12-03 DIAGNOSIS — F3289 Other specified depressive episodes: Secondary | ICD-10-CM | POA: Diagnosis not present

## 2022-12-03 DIAGNOSIS — R7303 Prediabetes: Secondary | ICD-10-CM

## 2022-12-03 DIAGNOSIS — Z6829 Body mass index (BMI) 29.0-29.9, adult: Secondary | ICD-10-CM

## 2022-12-03 MED ORDER — BUPROPION HCL ER (SR) 150 MG PO TB12: 150.0000 mg | ORAL_TABLET | Freq: Two times a day (BID) | ORAL | 0 refills | Status: DC

## 2022-12-03 MED ORDER — ESCITALOPRAM OXALATE 10 MG PO TABS: 10.0000 mg | ORAL_TABLET | Freq: Every day | ORAL | 2 refills | Status: DC

## 2022-12-03 MED ORDER — METFORMIN HCL 500 MG PO TABS: ORAL_TABLET | ORAL | 2 refills | Status: DC

## 2023-01-01 ENCOUNTER — Encounter (INDEPENDENT_AMBULATORY_CARE_PROVIDER_SITE_OTHER): Payer: Self-pay | Admitting: Family Medicine

## 2023-01-01 ENCOUNTER — Ambulatory Visit (INDEPENDENT_AMBULATORY_CARE_PROVIDER_SITE_OTHER): Payer: BC Managed Care – PPO | Admitting: Family Medicine

## 2023-01-01 VITALS — BP 100/65 | HR 85 | Temp 97.9°F | Ht 65.0 in | Wt 190.0 lb

## 2023-01-01 DIAGNOSIS — Z6831 Body mass index (BMI) 31.0-31.9, adult: Secondary | ICD-10-CM

## 2023-01-01 DIAGNOSIS — F3289 Other specified depressive episodes: Secondary | ICD-10-CM | POA: Diagnosis not present

## 2023-01-01 DIAGNOSIS — E669 Obesity, unspecified: Secondary | ICD-10-CM

## 2023-01-01 DIAGNOSIS — Z6833 Body mass index (BMI) 33.0-33.9, adult: Secondary | ICD-10-CM | POA: Insufficient documentation

## 2023-01-01 DIAGNOSIS — R7303 Prediabetes: Secondary | ICD-10-CM

## 2023-01-01 DIAGNOSIS — Z6832 Body mass index (BMI) 32.0-32.9, adult: Secondary | ICD-10-CM | POA: Insufficient documentation

## 2023-01-01 MED ORDER — BUPROPION HCL ER (SR) 150 MG PO TB12: 150.0000 mg | ORAL_TABLET | Freq: Two times a day (BID) | ORAL | 0 refills | Status: DC

## 2023-01-01 MED ORDER — METFORMIN HCL 500 MG PO TABS: ORAL_TABLET | ORAL | 2 refills | Status: DC

## 2023-01-01 MED ORDER — TOPIRAMATE 50 MG PO TABS: 50.0000 mg | ORAL_TABLET | Freq: Every day | ORAL | 0 refills | Status: DC

## 2023-01-01 MED ORDER — ESCITALOPRAM OXALATE 10 MG PO TABS: 10.0000 mg | ORAL_TABLET | Freq: Every day | ORAL | 2 refills | Status: DC

## 2023-01-14 NOTE — Progress Notes (Signed)
Chief Complaint:   OBESITY Leah Schneider is here to discuss her progress with her obesity treatment plan along with follow-up of her obesity related diagnoses. Leah Schneider is on the Category 2 Plan and keeping a food journal and adhering to recommended goals of 400-500 calories and 35+ grams of protein at supper and states she is following her eating plan approximately 80% of the time. Leah Schneider states she is doing 0 minutes 0 times per week.  Today's visit was #: 22 Starting weight: 241 lbs Starting date: 05/08/2021 Today's weight: 190 lbs Today's date: 01/01/2023 Total lbs lost to date: 51 Total lbs lost since last in-office visit: 0  Interim History: Leah Schneider has been struggling with meal planning and notes increased emotional eating behavior. She has been on a different schedule. She is working on getting back on track.   Subjective:   1. Prediabetes Leah Schneider is working on her diet but she is struggling more often.   2. Emotional Eating Behavior Leah Schneider is struggling with increased stress, comfort, and emotional eating. She feels the Wellbutrin is helping but not enough. She is on IUD for birthcontrol.   Assessment/Plan:   1. Prediabetes Leah Schneider will continue metformin, and we will refill for 90 days.   - metFORMIN (GLUCOPHAGE) 500 MG tablet; TAKE 1 TABLET BY MOUTH TWICE DAILY WITH A MEAL  Strength: 500 mg  Dispense: 60 tablet; Refill: 2  2. Emotional Eating Behavior Tameika agreed to Topamax 50 mg qhs with no refills. We will refill Lexapro for 90 days, and refill Wellbutrin SR for 1 month.   - buPROPion (WELLBUTRIN SR) 150 MG 12 hr tablet; Take 1 tablet (150 mg total) by mouth 2 (two) times daily.  Dispense: 60 tablet; Refill: 0 - escitalopram (LEXAPRO) 10 MG tablet; Take 1 tablet (10 mg total) by mouth daily.  Dispense: 30 tablet; Refill: 2 - topiramate (TOPAMAX) 50 MG tablet; Take 1 tablet (50 mg total) by mouth at bedtime.  Dispense: 30 tablet; Refill: 0  3. BMI  31.0-31.9,adult  4. Obesity, Beginning BMI 40.10 Leah Schneider is currently in the action stage of change. As such, her goal is to continue with weight loss efforts. She has agreed to the Category 2 Plan.   Behavioral modification strategies: increasing lean protein intake, no skipping meals, and meal planning and cooking strategies.  Leah Schneider has agreed to follow-up with our clinic in 4 weeks. She was informed of the importance of frequent follow-up visits to maximize her success with intensive lifestyle modifications for her multiple health conditions.   Objective:   Blood pressure 100/65, pulse 85, temperature 97.9 F (36.6 C), height 5\' 5"  (1.651 m), weight 190 lb (86.2 kg), SpO2 99 %. Body mass index is 31.62 kg/m.  Lab Results  Component Value Date   CREATININE 0.70 08/06/2022   BUN 11 08/06/2022   NA 142 08/06/2022   K 4.4 08/06/2022   CL 103 08/06/2022   CO2 24 08/06/2022   Lab Results  Component Value Date   ALT 13 08/06/2022   AST 15 08/06/2022   ALKPHOS 85 08/06/2022   BILITOT 0.3 08/06/2022   Lab Results  Component Value Date   HGBA1C 5.0 08/06/2022   HGBA1C 5.1 10/18/2021   HGBA1C 5.3 05/08/2021   Lab Results  Component Value Date   INSULIN 5.5 08/06/2022   INSULIN 6.8 10/18/2021   INSULIN 21.1 05/08/2021   Lab Results  Component Value Date   TSH 2.220 08/06/2022   Lab Results  Component Value Date  CHOL 142 08/06/2022   HDL 50 08/06/2022   LDLCALC 75 08/06/2022   TRIG 90 08/06/2022   Lab Results  Component Value Date   VD25OH 44.3 08/06/2022   VD25OH 47.1 10/18/2021   VD25OH 60.1 05/08/2021   Lab Results  Component Value Date   WBC 7.2 05/08/2021   HGB 15.1 05/08/2021   HCT 45.9 05/08/2021   MCV 88 05/08/2021   PLT 243 05/08/2021   No results found for: "IRON", "TIBC", "FERRITIN"  Attestation Statements:   Reviewed by clinician on day of visit: allergies, medications, problem list, medical history, surgical history, family history,  social history, and previous encounter notes.   I, Trixie Dredge, am acting as transcriptionist for Dennard Nip, MD.  I have reviewed the above documentation for accuracy and completeness, and I agree with the above. -  Dennard Nip, MD

## 2023-01-25 ENCOUNTER — Other Ambulatory Visit (INDEPENDENT_AMBULATORY_CARE_PROVIDER_SITE_OTHER): Payer: Self-pay | Admitting: Family Medicine

## 2023-01-25 DIAGNOSIS — F3289 Other specified depressive episodes: Secondary | ICD-10-CM

## 2023-01-29 ENCOUNTER — Encounter (INDEPENDENT_AMBULATORY_CARE_PROVIDER_SITE_OTHER): Payer: Self-pay | Admitting: Family Medicine

## 2023-01-29 ENCOUNTER — Telehealth (INDEPENDENT_AMBULATORY_CARE_PROVIDER_SITE_OTHER): Payer: BC Managed Care – PPO | Admitting: Family Medicine

## 2023-01-29 DIAGNOSIS — R7303 Prediabetes: Secondary | ICD-10-CM | POA: Diagnosis not present

## 2023-01-29 DIAGNOSIS — F3289 Other specified depressive episodes: Secondary | ICD-10-CM

## 2023-01-29 DIAGNOSIS — Z6831 Body mass index (BMI) 31.0-31.9, adult: Secondary | ICD-10-CM

## 2023-01-29 MED ORDER — TOPIRAMATE 50 MG PO TABS: 50.0000 mg | ORAL_TABLET | Freq: Every day | ORAL | 0 refills | Status: DC

## 2023-01-29 MED ORDER — METFORMIN HCL 500 MG PO TABS: ORAL_TABLET | ORAL | 2 refills | Status: DC

## 2023-01-29 MED ORDER — ESCITALOPRAM OXALATE 10 MG PO TABS: 10.0000 mg | ORAL_TABLET | Freq: Every day | ORAL | 2 refills | Status: DC

## 2023-01-29 MED ORDER — BUPROPION HCL ER (SR) 150 MG PO TB12: 150.0000 mg | ORAL_TABLET | Freq: Two times a day (BID) | ORAL | 0 refills | Status: DC

## 2023-01-29 NOTE — Progress Notes (Signed)
TeleHealth Visit:  This visit was completed with telemedicine (audio/video) technology. Leah Schneider has verbally consented to this TeleHealth visit. The patient is located at home, the provider is located at home. The participants in this visit include the listed provider and patient. The visit was conducted today via MyChart video.  OBESITY Leah Schneider is here to discuss her progress with her obesity treatment plan along with follow-up of her obesity related diagnoses.   Today's visit was # 23 Starting weight: 241 lbs Starting date: 05/08/2021 Weight at last in office visit: 190 lbs on 01/01/23 Total weight loss: 51 lbs at last in office visit on 01/01/23. Today's reported weight (01/29/23):  184 lbs  Nutrition Plan: the Category 2 plan - 95% adherence.  Current exercise:  Cameron Proud Next Challenge- has done 2 days.   Interim History:  She switched to a virtual visit because her son is ill.  Leah Schneider reports that she has gotten back on the meal plan and resumed exercise.  Both of her kids are in sports so dinners are a challenge. She tries not to eat out.  She generally will have eggs or protein oatmeal for dinner. Reports her appetite and cravings are better controlled since starting topiramate.  Eating all of the prescribed protein: yes  Skipping meals: No Drinking adequate water: Yes Drinking sugar sweetened beverages: No Hunger controlled: well controlled. Cravings controlled:  well controlled.  Assessment/Plan:  1.  Emotional eating behaviors Topiramate added last visit and she reports it has helped with hunger and cravings. Currently this is well controlled. Overall mood is stable. Medication(s): Bupropion SR 150 mg twice daily, Lexapro 10 mg daily, and Topiramate 50 mg at bedtime. Denies side effects from topiramate.  Plan: Continue and refill Bupropion SR 150 mg twice daily, Topiramate 50 mg at bedtime, and Other: Lexapro 10 mg daily.   2. Prediabetes Last A1c was  5.0  Medication(s): Metformin 500 mg twice daily with meals Lab Results  Component Value Date   HGBA1C 5.0 08/06/2022   HGBA1C 5.1 10/18/2021   HGBA1C 5.3 05/08/2021   Lab Results  Component Value Date   INSULIN 5.5 08/06/2022   INSULIN 6.8 10/18/2021   INSULIN 21.1 05/08/2021    Plan: Continue and refill Metformin 500 mg twice daily with meals   3. Morbid Obesity: Current BMI 31 Leah Schneider is currently in the action stage of change. As such, her goal is to continue with weight loss efforts.  She has agreed to the Category 2 plan.  Exercise goals: Cameron Proud Next Challenge 3 days/week  Behavioral modification strategies: increasing lean protein intake, decreasing simple carbohydrates , meal planning , and planning for success.  Leah Schneider has agreed to follow-up with our clinic in 4 weeks.   No orders of the defined types were placed in this encounter.   Medications Discontinued During This Encounter  Medication Reason   buPROPion (WELLBUTRIN SR) 150 MG 12 hr tablet Reorder   escitalopram (LEXAPRO) 10 MG tablet Reorder   metFORMIN (GLUCOPHAGE) 500 MG tablet Reorder   topiramate (TOPAMAX) 50 MG tablet Reorder     Meds ordered this encounter  Medications   buPROPion (WELLBUTRIN SR) 150 MG 12 hr tablet    Sig: Take 1 tablet (150 mg total) by mouth 2 (two) times daily.    Dispense:  60 tablet    Refill:  0    Order Specific Question:   Supervising Provider    Answer:   Dell Ponto [[id number]]   metFORMIN (GLUCOPHAGE) 500 MG  tablet    Sig: TAKE 1 TABLET BY MOUTH TWICE DAILY WITH A MEAL  Strength: 500 mg    Dispense:  60 tablet    Refill:  2    PLEASE DISREGARD PREVIOUS RX    Order Specific Question:   Supervising Provider    Answer:   Dell Ponto [[id number]]   topiramate (TOPAMAX) 50 MG tablet    Sig: Take 1 tablet (50 mg total) by mouth at bedtime.    Dispense:  30 tablet    Refill:  0    Order Specific Question:   Supervising Provider    Answer:   Dell Ponto  [[id number]]   escitalopram (LEXAPRO) 10 MG tablet    Sig: Take 1 tablet (10 mg total) by mouth daily.    Dispense:  30 tablet    Refill:  2    PLEASE DISREGARD PREVIOUS RX    Order Specific Question:   Supervising Provider    Answer:   Dell Ponto [[id number]]      Objective:   VITALS: Per patient if applicable, see vitals. GENERAL: Alert and in no acute distress. CARDIOPULMONARY: No increased WOB. Speaking in clear sentences.  PSYCH: Pleasant and cooperative. Speech normal rate and rhythm. Affect is appropriate. Insight and judgement are appropriate. Attention is focused, linear, and appropriate.  NEURO: Oriented as arrived to appointment on time with no prompting.   Attestation Statements:   Reviewed by clinician on day of visit: allergies, medications, problem list, medical history, surgical history, family history, social history, and previous encounter notes.   This was prepared with the assistance of Presenter, broadcasting.  Occasional wrong-word or sound-a-like substitutions may have occurred due to the inherent limitations of voice recognition software.

## 2023-02-23 ENCOUNTER — Other Ambulatory Visit (INDEPENDENT_AMBULATORY_CARE_PROVIDER_SITE_OTHER): Payer: Self-pay | Admitting: Family Medicine

## 2023-02-23 DIAGNOSIS — F3289 Other specified depressive episodes: Secondary | ICD-10-CM

## 2023-02-27 ENCOUNTER — Encounter (INDEPENDENT_AMBULATORY_CARE_PROVIDER_SITE_OTHER): Payer: Self-pay | Admitting: Family Medicine

## 2023-02-27 ENCOUNTER — Ambulatory Visit (INDEPENDENT_AMBULATORY_CARE_PROVIDER_SITE_OTHER): Payer: BC Managed Care – PPO | Admitting: Family Medicine

## 2023-02-27 VITALS — BP 110/75 | HR 75 | Temp 98.4°F | Ht 65.0 in | Wt 189.4 lb

## 2023-02-27 DIAGNOSIS — E669 Obesity, unspecified: Secondary | ICD-10-CM | POA: Diagnosis not present

## 2023-02-27 DIAGNOSIS — Z6831 Body mass index (BMI) 31.0-31.9, adult: Secondary | ICD-10-CM

## 2023-02-27 DIAGNOSIS — R7303 Prediabetes: Secondary | ICD-10-CM | POA: Diagnosis not present

## 2023-02-27 DIAGNOSIS — F3289 Other specified depressive episodes: Secondary | ICD-10-CM | POA: Diagnosis not present

## 2023-02-27 MED ORDER — ESCITALOPRAM OXALATE 20 MG PO TABS: 20.0000 mg | ORAL_TABLET | Freq: Every day | ORAL | 0 refills | Status: DC

## 2023-02-27 MED ORDER — BUPROPION HCL ER (SR) 150 MG PO TB12: 150.0000 mg | ORAL_TABLET | Freq: Two times a day (BID) | ORAL | 0 refills | Status: DC

## 2023-02-27 MED ORDER — TOPIRAMATE 50 MG PO TABS: 50.0000 mg | ORAL_TABLET | Freq: Every day | ORAL | 0 refills | Status: DC

## 2023-02-27 MED ORDER — METFORMIN HCL 500 MG PO TABS: ORAL_TABLET | ORAL | 0 refills | Status: DC

## 2023-03-03 NOTE — Progress Notes (Signed)
Chief Complaint:   OBESITY Leah Schneider is here to discuss her progress with her obesity treatment plan along with follow-up of her obesity related diagnoses. Leah Schneider is on the Category 2 Plan and states she is following her eating plan approximately 90% of the time. Leah Schneider states she is exercising with a workout video for 60 minutes 4 times per week.  Today's visit was #: 24 Starting weight: 241 lbs Starting date: 05/08/2021 Today's weight: 189 lbs Today's date: 02/27/2023 Total lbs lost to date: 52 Total lbs lost since last in-office visit: 1  Interim History: Leah Schneider has lost a bit more weight, but she has had extra challenges and stressors lately.  Her mood has decreased and she has not been exercising as much as she used to.  Subjective:   1. Prediabetes Leah Schneider is on metformin with no nausea or vomiting.  She is working on decreasing simple carbohydrates, but she is struggling more recently.  2. Emotional Eating Behavior Leah Schneider's stress has increased and her mood has decreased. She is sleeping more and exercising less. She has increased comfort eating lately. She has had no side effects on her medications.  Assessment/Plan:   1. Prediabetes Leah Schneider will continue metformin 500 mg BID, and we will refill for 1 month.   - metFORMIN (GLUCOPHAGE) 500 MG tablet; TAKE 1 TABLET BY MOUTH TWICE DAILY WITH A MEAL  Strength: 500 mg  Dispense: 60 tablet; Refill: 0  2. Emotional Eating Behavior Leah Schneider will continue her medications, and we will refill Wellbutrin and Topamax for 1 month. She agreed to increase Lexapro to 20 mg once daily, and we will refill for 1 month.   - topiramate (TOPAMAX) 50 MG tablet; Take 1 tablet (50 mg total) by mouth at bedtime.  Dispense: 30 tablet; Refill: 0 - escitalopram (LEXAPRO) 20 MG tablet; Take 1 tablet (20 mg total) by mouth daily.  Dispense: 30 tablet; Refill: 0 - buPROPion (WELLBUTRIN SR) 150 MG 12 hr tablet; Take 1 tablet (150 mg total) by mouth 2  (two) times daily.  Dispense: 60 tablet; Refill: 0  3. BMI 31.0-31.9,adult  4. Obesity, Beginning BMI 40.10 Leah Schneider is currently in the action stage of change. As such, her goal is to continue with weight loss efforts. She has agreed to the Category 2 Plan.   Exercise goals: As is.   Behavioral modification strategies: increasing lean protein intake, no skipping meals, and emotional eating strategies.  Leah Schneider has agreed to follow-up with our clinic in 4 weeks. She was informed of the importance of frequent follow-up visits to maximize her success with intensive lifestyle modifications for her multiple health conditions.   Objective:   Blood pressure 110/75, pulse 75, temperature 98.4 F (36.9 C), height 5\' 5"  (1.651 m), weight 189 lb 6.4 oz (85.9 kg), SpO2 97 %. Body mass index is 31.52 kg/m.  Lab Results  Component Value Date   CREATININE 0.70 08/06/2022   BUN 11 08/06/2022   NA 142 08/06/2022   K 4.4 08/06/2022   CL 103 08/06/2022   CO2 24 08/06/2022   Lab Results  Component Value Date   ALT 13 08/06/2022   AST 15 08/06/2022   ALKPHOS 85 08/06/2022   BILITOT 0.3 08/06/2022   Lab Results  Component Value Date   HGBA1C 5.0 08/06/2022   HGBA1C 5.1 10/18/2021   HGBA1C 5.3 05/08/2021   Lab Results  Component Value Date   INSULIN 5.5 08/06/2022   INSULIN 6.8 10/18/2021   INSULIN 21.1 05/08/2021  Lab Results  Component Value Date   TSH 2.220 08/06/2022   Lab Results  Component Value Date   CHOL 142 08/06/2022   HDL 50 08/06/2022   LDLCALC 75 08/06/2022   TRIG 90 08/06/2022   Lab Results  Component Value Date   VD25OH 44.3 08/06/2022   VD25OH 47.1 10/18/2021   VD25OH 60.1 05/08/2021   Lab Results  Component Value Date   WBC 7.2 05/08/2021   HGB 15.1 05/08/2021   HCT 45.9 05/08/2021   MCV 88 05/08/2021   PLT 243 05/08/2021   No results found for: "IRON", "TIBC", "FERRITIN"  Attestation Statements:   Reviewed by clinician on day of visit:  allergies, medications, problem list, medical history, surgical history, family history, social history, and previous encounter notes.   I, Burt Knack, am acting as transcriptionist for Leah Quince, MD.  I have reviewed the above documentation for accuracy and completeness, and I agree with the above. -  Leah Quince, MD

## 2023-04-02 ENCOUNTER — Telehealth (INDEPENDENT_AMBULATORY_CARE_PROVIDER_SITE_OTHER): Payer: BC Managed Care – PPO | Admitting: Family Medicine

## 2023-04-02 ENCOUNTER — Encounter (INDEPENDENT_AMBULATORY_CARE_PROVIDER_SITE_OTHER): Payer: Self-pay | Admitting: Family Medicine

## 2023-04-02 DIAGNOSIS — F3289 Other specified depressive episodes: Secondary | ICD-10-CM

## 2023-04-02 DIAGNOSIS — R7303 Prediabetes: Secondary | ICD-10-CM | POA: Diagnosis not present

## 2023-04-02 DIAGNOSIS — Z6831 Body mass index (BMI) 31.0-31.9, adult: Secondary | ICD-10-CM | POA: Diagnosis not present

## 2023-04-02 MED ORDER — METFORMIN HCL 500 MG PO TABS
ORAL_TABLET | ORAL | 0 refills | Status: DC
Start: 2023-04-02 — End: 2023-06-04

## 2023-04-02 MED ORDER — BUPROPION HCL ER (SR) 150 MG PO TB12
150.0000 mg | ORAL_TABLET | Freq: Two times a day (BID) | ORAL | 0 refills | Status: DC
Start: 2023-04-02 — End: 2023-05-05

## 2023-04-02 MED ORDER — ESCITALOPRAM OXALATE 20 MG PO TABS: 20.0000 mg | ORAL_TABLET | Freq: Every day | ORAL | 0 refills | Status: DC

## 2023-04-02 MED ORDER — TOPIRAMATE 50 MG PO TABS: 50.0000 mg | ORAL_TABLET | Freq: Two times a day (BID) | ORAL | 0 refills | Status: DC

## 2023-04-02 NOTE — Progress Notes (Signed)
TeleHealth Visit:  This visit was completed with telemedicine (audio/video) technology. Leah Schneider has verbally consented to this TeleHealth visit. The patient is located at home, the provider is located at home. The participants in this visit include the listed provider and patient. The visit was conducted today via MyChart video.  OBESITY Leah Schneider is here to discuss her progress with her obesity treatment plan along with follow-up of her obesity related diagnoses.   Today's visit was # 25 Starting weight: 241 lbs Starting date: 05/08/2021 Weight at last in office visit: 189 lbs on 02/27/23 Total weight loss: 52 lbs at last in office visit on 02/27/23. Today's reported weight (04/02/23): none reported  Nutrition Plan: the Category 2 plan - 85% adherence.  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 She notes increased stress at work and home.  Struggling with hunger and cravings. Protein intake is good. No SSBs. She is tired of lunch meat. Dinner varies because her kids are picky and like foods such as butter noodles.   Assessment/Plan:  1. Emotional eating behavior Leah Schneider is having issues with stress/emotional eating. Currently this is poorly controlled despite being on both bupropion and topiramate.  Overall mood is stable. Medication(s): Bupropion SR 150 mg twice daily, Lexapro 20 mg daily, topiramate 50 mg at bedtime.  Plan: Continue and increase dose Topiramate 50 mg twice daily Refill bupropion 150 mg twice daily Refill Lexapro 20 mg daily   2. Prediabetes Last A1c was 5.0. Medication(s): Metformin 500 mg twice daily with meals Polyphagia:Yes Lab Results  Component Value Date   HGBA1C 5.0 08/06/2022   HGBA1C 5.1 10/18/2021   HGBA1C 5.3 05/08/2021   Lab Results  Component Value Date   INSULIN 5.5 08/06/2022   INSULIN  6.8 10/18/2021   INSULIN 21.1 05/08/2021    Plan: Continue and refill Metformin 500 mg twice daily with meals   3. Morbid Obesity: Current BMI 31 Leah Schneider is currently in the action stage of change. As such, her goal is to continue with weight loss efforts.  She has agreed to the Category 2 plan.  1.  Aim for 35 g of protein at dinner. 2.  Discussed alternative lunch ideas.  Behavioral modification strategies: increasing lean protein intake, decreasing simple carbohydrates , meal planning , planning for success, and dealing with family or coworker sabotage.  Leah Schneider has agreed to follow-up with our clinic in 5 weeks with fasting labs.  No orders of the defined types were placed in this encounter.   Medications Discontinued During This Encounter  Medication Reason   topiramate (TOPAMAX) 50 MG tablet Reorder   metFORMIN (GLUCOPHAGE) 500 MG tablet Reorder   escitalopram (LEXAPRO) 20 MG tablet Reorder   buPROPion (WELLBUTRIN SR) 150 MG 12 hr tablet Reorder     Meds ordered this encounter  Medications   topiramate (TOPAMAX) 50 MG tablet    Sig: Take 1 tablet (50 mg total) by mouth 2 (two) times daily.    Dispense:  60 tablet    Refill:  0    Order Specific Question:   Supervising Provider    Answer:   Glennis Brink [[id number]]   buPROPion (WELLBUTRIN SR) 150 MG 12 hr tablet    Sig: Take 1 tablet (150 mg total) by mouth 2 (two) times daily.    Dispense:  60 tablet    Refill:  0  Order Specific Question:   Supervising Provider    Answer:   Glennis Brink [[id number]]   metFORMIN (GLUCOPHAGE) 500 MG tablet    Sig: TAKE 1 TABLET BY MOUTH TWICE DAILY WITH A MEAL  Strength: 500 mg    Dispense:  60 tablet    Refill:  0    Order Specific Question:   Supervising Provider    Answer:   Glennis Brink [[id number]]   escitalopram (LEXAPRO) 20 MG tablet    Sig: Take 1 tablet (20 mg total) by mouth daily.    Dispense:  30 tablet    Refill:  0    Order Specific Question:   Supervising Provider     Answer:   Glennis Brink [[id number]]      Objective:   VITALS: Per patient if applicable, see vitals. GENERAL: Alert and in no acute distress. CARDIOPULMONARY: No increased WOB. Speaking in clear sentences.  PSYCH: Pleasant and cooperative. Speech normal rate and rhythm. Affect is appropriate. Insight and judgement are appropriate. Attention is focused, linear, and appropriate.  NEURO: Oriented as arrived to appointment on time with no prompting.   Attestation Statements:   Reviewed by clinician on day of visit: allergies, medications, problem list, medical history, surgical history, family history, social history, and previous encounter notes.  This was prepared with the assistance of Engineer, civil (consulting).  Occasional wrong-word or sound-a-like substitutions may have occurred due to the inherent limitations of voice recognition software.

## 2023-04-03 ENCOUNTER — Telehealth (INDEPENDENT_AMBULATORY_CARE_PROVIDER_SITE_OTHER): Payer: BC Managed Care – PPO | Admitting: Family Medicine

## 2023-05-03 ENCOUNTER — Other Ambulatory Visit (INDEPENDENT_AMBULATORY_CARE_PROVIDER_SITE_OTHER): Payer: Self-pay | Admitting: Family Medicine

## 2023-05-03 DIAGNOSIS — F3289 Other specified depressive episodes: Secondary | ICD-10-CM

## 2023-05-08 ENCOUNTER — Ambulatory Visit (INDEPENDENT_AMBULATORY_CARE_PROVIDER_SITE_OTHER): Payer: BC Managed Care – PPO | Admitting: Family Medicine

## 2023-05-14 ENCOUNTER — Other Ambulatory Visit (INDEPENDENT_AMBULATORY_CARE_PROVIDER_SITE_OTHER): Payer: Self-pay | Admitting: Family Medicine

## 2023-05-14 DIAGNOSIS — F3289 Other specified depressive episodes: Secondary | ICD-10-CM

## 2023-06-03 ENCOUNTER — Other Ambulatory Visit (INDEPENDENT_AMBULATORY_CARE_PROVIDER_SITE_OTHER): Payer: Self-pay | Admitting: Family Medicine

## 2023-06-03 DIAGNOSIS — F3289 Other specified depressive episodes: Secondary | ICD-10-CM

## 2023-06-04 ENCOUNTER — Ambulatory Visit (INDEPENDENT_AMBULATORY_CARE_PROVIDER_SITE_OTHER): Payer: BC Managed Care – PPO | Admitting: Family Medicine

## 2023-06-04 ENCOUNTER — Encounter (INDEPENDENT_AMBULATORY_CARE_PROVIDER_SITE_OTHER): Payer: Self-pay | Admitting: Family Medicine

## 2023-06-04 VITALS — BP 82/55 | HR 61 | Temp 98.2°F | Ht 65.0 in | Wt 192.0 lb

## 2023-06-04 DIAGNOSIS — F3289 Other specified depressive episodes: Secondary | ICD-10-CM | POA: Diagnosis not present

## 2023-06-04 DIAGNOSIS — E669 Obesity, unspecified: Secondary | ICD-10-CM

## 2023-06-04 DIAGNOSIS — Z6831 Body mass index (BMI) 31.0-31.9, adult: Secondary | ICD-10-CM

## 2023-06-04 DIAGNOSIS — E559 Vitamin D deficiency, unspecified: Secondary | ICD-10-CM | POA: Diagnosis not present

## 2023-06-04 DIAGNOSIS — R7303 Prediabetes: Secondary | ICD-10-CM | POA: Diagnosis not present

## 2023-06-04 DIAGNOSIS — Z6832 Body mass index (BMI) 32.0-32.9, adult: Secondary | ICD-10-CM

## 2023-06-04 MED ORDER — ESCITALOPRAM OXALATE 20 MG PO TABS
20.0000 mg | ORAL_TABLET | Freq: Every day | ORAL | 0 refills | Status: DC
Start: 2023-06-04 — End: 2023-07-09

## 2023-06-04 MED ORDER — BUPROPION HCL ER (SR) 150 MG PO TB12
150.0000 mg | ORAL_TABLET | Freq: Two times a day (BID) | ORAL | 0 refills | Status: DC
Start: 2023-06-04 — End: 2023-07-09

## 2023-06-04 MED ORDER — METFORMIN HCL 500 MG PO TABS
ORAL_TABLET | ORAL | 0 refills | Status: DC
Start: 2023-06-04 — End: 2023-07-09

## 2023-06-04 NOTE — Progress Notes (Signed)
Chief Complaint:   OBESITY Leah Schneider is here to discuss her progress with her obesity treatment plan along with follow-up of her obesity related diagnoses. Leah Schneider is on the Category 2 Plan and states she is following her eating plan approximately 80% of the time. Leah Schneider states she is doing mixed cardio for 30 minutes 1 time per week.  Today's visit was #: 26 Starting weight: 241 lbs Starting date: 05/08/2021 Today's weight: 192 lbs Today's date: 06/04/2023 Total lbs lost to date: 49 Total lbs lost since last in-office visit: 0  Interim History: Patient's last office visit was approximately 3 months ago.  She has struggled more with meal planning recently.  Subjective:   1. Vitamin D deficiency Patient is on vitamin D, and she is due to have labs rechecked.  2. Prediabetes Patient is on metformin, and she is working on her diet but she is struggling at times.  3. Emotional Eating Behavior Patient is struggling with some cravings and emotional eating behavior, worse with increased stress.  Assessment/Plan:   1. Vitamin D deficiency We will check labs today, we will follow-up at patient's next visit.  - VITAMIN D 25 Hydroxy (Vit-D Deficiency, Fractures) - Lipid Panel With LDL/HDL Ratio - Insulin, random - Hemoglobin A1c  2. Prediabetes We will check labs today, and we will follow-up at her next visit.  Patient will continue metformin and will continue to decrease simple carbohydrates.  We will refill metformin for 1 month.  - metFORMIN (GLUCOPHAGE) 500 MG tablet; TAKE 1 TABLET BY MOUTH TWICE DAILY WITH A MEAL  Strength: 500 mg  Dispense: 60 tablet; Refill: 0 - CMP14+EGFR - Vitamin B12  3. Emotional Eating Behavior Patient will continue her medications, and we will refill Wellbutrin SR and Lexapro for 1 month.  - buPROPion (WELLBUTRIN SR) 150 MG 12 hr tablet; Take 1 tablet (150 mg total) by mouth 2 (two) times daily.  Dispense: 60 tablet; Refill: 0 - escitalopram  (LEXAPRO) 20 MG tablet; Take 1 tablet (20 mg total) by mouth daily.  Dispense: 30 tablet; Refill: 0  4. BMI 32.0-32.9,adult  5. Obesity, Beginning BMI 40.10 Leah Schneider is currently in the action stage of change. As such, her goal is to continue with weight loss efforts. She has agreed to the Category 2 Plan.   Exercise goals: For substantial health benefits, adults should do at least 150 minutes (2 hours and 30 minutes) a week of moderate-intensity, or 75 minutes (1 hour and 15 minutes) a week of vigorous-intensity aerobic physical activity, or an equivalent combination of moderate- and vigorous-intensity aerobic activity. Aerobic activity should be performed in episodes of at least 10 minutes, and preferably, it should be spread throughout the week.  Behavioral modification strategies: meal planning and cooking strategies.  Leah Schneider has agreed to follow-up with our clinic in 4 weeks. She was informed of the importance of frequent follow-up visits to maximize her success with intensive lifestyle modifications for her multiple health conditions.   Leah Schneider was informed we would discuss her lab results at her next visit unless there is a critical issue that needs to be addressed sooner. Leah Schneider agreed to keep her next visit at the agreed upon time to discuss these results.  Objective:   Blood pressure (!) 82/55, pulse 61, temperature 98.2 F (36.8 C), height 5\' 5"  (1.651 m), weight 192 lb (87.1 kg), SpO2 100%. Body mass index is 31.95 kg/m.  Lab Results  Component Value Date   CREATININE 0.70 08/06/2022   BUN  11 08/06/2022   NA 142 08/06/2022   K 4.4 08/06/2022   CL 103 08/06/2022   CO2 24 08/06/2022   Lab Results  Component Value Date   ALT 13 08/06/2022   AST 15 08/06/2022   ALKPHOS 85 08/06/2022   BILITOT 0.3 08/06/2022   Lab Results  Component Value Date   HGBA1C 5.0 08/06/2022   HGBA1C 5.1 10/18/2021   HGBA1C 5.3 05/08/2021   Lab Results  Component Value Date   INSULIN 5.5  08/06/2022   INSULIN 6.8 10/18/2021   INSULIN 21.1 05/08/2021   Lab Results  Component Value Date   TSH 2.220 08/06/2022   Lab Results  Component Value Date   CHOL 142 08/06/2022   HDL 50 08/06/2022   LDLCALC 75 08/06/2022   TRIG 90 08/06/2022   Lab Results  Component Value Date   VD25OH 44.3 08/06/2022   VD25OH 47.1 10/18/2021   VD25OH 60.1 05/08/2021   Lab Results  Component Value Date   WBC 7.2 05/08/2021   HGB 15.1 05/08/2021   HCT 45.9 05/08/2021   MCV 88 05/08/2021   PLT 243 05/08/2021   No results found for: "IRON", "TIBC", "FERRITIN"  Attestation Statements:   Reviewed by clinician on day of visit: allergies, medications, problem list, medical history, surgical history, family history, social history, and previous encounter notes.   I, Burt Knack, am acting as transcriptionist for Quillian Quince, MD.  I have reviewed the above documentation for accuracy and completeness, and I agree with the above. -  Quillian Quince, MD

## 2023-06-11 ENCOUNTER — Other Ambulatory Visit (INDEPENDENT_AMBULATORY_CARE_PROVIDER_SITE_OTHER): Payer: Self-pay | Admitting: Family Medicine

## 2023-06-11 DIAGNOSIS — F3289 Other specified depressive episodes: Secondary | ICD-10-CM

## 2023-06-21 ENCOUNTER — Other Ambulatory Visit (INDEPENDENT_AMBULATORY_CARE_PROVIDER_SITE_OTHER): Payer: Self-pay | Admitting: Physician Assistant

## 2023-06-21 DIAGNOSIS — F3289 Other specified depressive episodes: Secondary | ICD-10-CM

## 2023-07-06 ENCOUNTER — Other Ambulatory Visit (INDEPENDENT_AMBULATORY_CARE_PROVIDER_SITE_OTHER): Payer: Self-pay | Admitting: Family Medicine

## 2023-07-06 DIAGNOSIS — F3289 Other specified depressive episodes: Secondary | ICD-10-CM

## 2023-07-09 ENCOUNTER — Encounter (INDEPENDENT_AMBULATORY_CARE_PROVIDER_SITE_OTHER): Payer: Self-pay | Admitting: Family Medicine

## 2023-07-09 ENCOUNTER — Ambulatory Visit (INDEPENDENT_AMBULATORY_CARE_PROVIDER_SITE_OTHER): Payer: BC Managed Care – PPO | Admitting: Family Medicine

## 2023-07-09 VITALS — BP 97/61 | HR 80 | Temp 98.4°F | Ht 65.0 in | Wt 198.0 lb

## 2023-07-09 DIAGNOSIS — E669 Obesity, unspecified: Secondary | ICD-10-CM

## 2023-07-09 DIAGNOSIS — F5089 Other specified eating disorder: Secondary | ICD-10-CM | POA: Diagnosis not present

## 2023-07-09 DIAGNOSIS — Z6833 Body mass index (BMI) 33.0-33.9, adult: Secondary | ICD-10-CM | POA: Diagnosis not present

## 2023-07-09 DIAGNOSIS — F3289 Other specified depressive episodes: Secondary | ICD-10-CM

## 2023-07-09 DIAGNOSIS — E88819 Insulin resistance, unspecified: Secondary | ICD-10-CM | POA: Diagnosis not present

## 2023-07-09 MED ORDER — ESCITALOPRAM OXALATE 20 MG PO TABS
20.0000 mg | ORAL_TABLET | Freq: Every day | ORAL | 0 refills | Status: DC
Start: 2023-07-09 — End: 2023-08-20

## 2023-07-09 MED ORDER — TOPIRAMATE 50 MG PO TABS
50.0000 mg | ORAL_TABLET | Freq: Two times a day (BID) | ORAL | 0 refills | Status: DC
Start: 2023-07-09 — End: 2023-08-20

## 2023-07-09 MED ORDER — METFORMIN HCL 500 MG PO TABS
ORAL_TABLET | ORAL | 0 refills | Status: DC
Start: 2023-07-09 — End: 2023-08-20

## 2023-07-09 MED ORDER — BUPROPION HCL ER (SR) 200 MG PO TB12
200.0000 mg | ORAL_TABLET | Freq: Two times a day (BID) | ORAL | 0 refills | Status: DC
Start: 2023-07-09 — End: 2023-08-20

## 2023-07-16 NOTE — Progress Notes (Unsigned)
Chief Complaint:   OBESITY Laneice is here to discuss her progress with her obesity treatment plan along with follow-up of her obesity related diagnoses. Catalia is on the Category 2 Plan and states she is following her eating plan approximately 85% of the time. Irma states she is exercising with different workout videos for 30 minutes 1-2 times per week.  Today's visit was #: 27 Starting weight: 241 lbs Starting date: 05/08/2021 Today's weight: 198 lbs Today's date: 07/09/2023 Total lbs lost to date: 43 Total lbs lost since last in-office visit: 0  Interim History: Patient has been struggling with increased stress and emotional eating behavior.  She is frustrated that her weight is going in the wrong direction and she knows she needs to get back on track.  Subjective:   1. Insulin resistance Patient is on metformin with no GI upset.  She is struggling with increased simple carbohydrates recently.  2. Emotional Eating Behavior Patient is struggling more with emotional eating behavior especially with increased stress.  Assessment/Plan:   1. Insulin resistance We will refill metformin 500 mg twice daily for 1 month.  Patient is to change to the low carbohydrate plan for 3 weeks.  - metFORMIN (GLUCOPHAGE) 500 MG tablet; TAKE 1 TABLET BY MOUTH TWICE DAILY WITH A MEAL  Strength: 500 mg  Dispense: 60 tablet; Refill: 0  2. Emotional Eating Behavior Patient agreed to increase Wellbutrin SR to 200 mg twice daily, and we will refill for 1 month.  We will refill Topamax 50 mg twice daily and Lexapro 20 mg once daily for 1 month.  - topiramate (TOPAMAX) 50 MG tablet; Take 1 tablet (50 mg total) by mouth 2 (two) times daily.  Dispense: 60 tablet; Refill: 0 - escitalopram (LEXAPRO) 20 MG tablet; Take 1 tablet (20 mg total) by mouth daily.  Dispense: 30 tablet; Refill: 0 - buPROPion (WELLBUTRIN SR) 200 MG 12 hr tablet; Take 1 tablet (200 mg total) by mouth 2 (two) times daily.  Dispense:  60 tablet; Refill: 0  3. BMI 33.0-33.9,adult  4. Obesity, Beginning BMI 40.10 Porscha is currently in the action stage of change. As such, her goal is to continue with weight loss efforts. She has agreed to change to following a lower carbohydrate, vegetable and lean protein rich diet plan.   Exercise goals: As is.   Behavioral modification strategies: increasing lean protein intake and increasing water intake.  Merritt has agreed to follow-up with our clinic in 3 weeks. She was informed of the importance of frequent follow-up visits to maximize her success with intensive lifestyle modifications for her multiple health conditions.   Objective:   Blood pressure 97/61, pulse 80, temperature 98.4 F (36.9 C), height 5\' 5"  (1.651 m), weight 198 lb (89.8 kg), SpO2 97%. Body mass index is 32.95 kg/m.  Lab Results  Component Value Date   CREATININE 0.91 06/04/2023   BUN 20 06/04/2023   NA 140 06/04/2023   K 4.7 06/04/2023   CL 104 06/04/2023   CO2 23 06/04/2023   Lab Results  Component Value Date   ALT 16 06/04/2023   AST 17 06/04/2023   ALKPHOS 79 06/04/2023   BILITOT <0.2 06/04/2023   Lab Results  Component Value Date   HGBA1C 5.0 06/04/2023   HGBA1C 5.0 08/06/2022   HGBA1C 5.1 10/18/2021   HGBA1C 5.3 05/08/2021   Lab Results  Component Value Date   INSULIN 6.0 06/04/2023   INSULIN 5.5 08/06/2022   INSULIN 6.8 10/18/2021  INSULIN 21.1 05/08/2021   Lab Results  Component Value Date   TSH 2.220 08/06/2022   Lab Results  Component Value Date   CHOL 124 06/04/2023   HDL 51 06/04/2023   LDLCALC 58 06/04/2023   TRIG 73 06/04/2023   Lab Results  Component Value Date   VD25OH 89.7 06/04/2023   VD25OH 44.3 08/06/2022   VD25OH 47.1 10/18/2021   Lab Results  Component Value Date   WBC 7.2 05/08/2021   HGB 15.1 05/08/2021   HCT 45.9 05/08/2021   MCV 88 05/08/2021   PLT 243 05/08/2021   No results found for: "IRON", "TIBC", "FERRITIN"  Attestation  Statements:   Reviewed by clinician on day of visit: allergies, medications, problem list, medical history, surgical history, family history, social history, and previous encounter notes.   I, Burt Knack, am acting as transcriptionist for Quillian Quince, MD.  I have reviewed the above documentation for accuracy and completeness, and I agree with the above. -  Quillian Quince, MD

## 2023-07-30 ENCOUNTER — Ambulatory Visit (INDEPENDENT_AMBULATORY_CARE_PROVIDER_SITE_OTHER): Payer: BC Managed Care – PPO | Admitting: Family Medicine

## 2023-08-05 ENCOUNTER — Ambulatory Visit (INDEPENDENT_AMBULATORY_CARE_PROVIDER_SITE_OTHER): Payer: BC Managed Care – PPO | Admitting: Family Medicine

## 2023-08-06 ENCOUNTER — Other Ambulatory Visit (INDEPENDENT_AMBULATORY_CARE_PROVIDER_SITE_OTHER): Payer: Self-pay | Admitting: Family Medicine

## 2023-08-06 DIAGNOSIS — F3289 Other specified depressive episodes: Secondary | ICD-10-CM

## 2023-08-07 ENCOUNTER — Other Ambulatory Visit (INDEPENDENT_AMBULATORY_CARE_PROVIDER_SITE_OTHER): Payer: Self-pay | Admitting: Family Medicine

## 2023-08-07 DIAGNOSIS — F3289 Other specified depressive episodes: Secondary | ICD-10-CM

## 2023-08-08 ENCOUNTER — Ambulatory Visit (INDEPENDENT_AMBULATORY_CARE_PROVIDER_SITE_OTHER): Payer: BC Managed Care – PPO | Admitting: Orthopaedic Surgery

## 2023-08-08 ENCOUNTER — Encounter: Payer: Self-pay | Admitting: Orthopaedic Surgery

## 2023-08-08 DIAGNOSIS — R202 Paresthesia of skin: Secondary | ICD-10-CM | POA: Diagnosis not present

## 2023-08-08 NOTE — Progress Notes (Signed)
Office Visit Note   Patient: Paediatric nurse           Date of Birth: [birth year]           MRN: [id number] Visit Date: 08/08/2023              Requested by: Sigmund Hazel, MD [address] [place],  [place] [address] PCP: Sigmund Hazel, MD   Assessment & Plan: Visit Diagnoses:  1. Paresthesia of hand, bilateral     Plan: Impression is bilateral hand paresthesias.  At this point, would like to order nerve conduction study/EMG bilateral upper extremities.  We have provided her with a left wrist splint as she already has 1 for the right.  She will follow-up with [place] once nerve conduction studies have been completed.  Call with concerns or questions.  Follow-Up Instructions: Return for f/u after ncs/emg.   Orders:  No orders of the defined types were placed in this encounter.  No orders of the defined types were placed in this encounter.     Procedures: No procedures performed   Clinical Data: No additional findings.   Subjective: Chief Complaint  Patient presents with   Right Wrist - Pain, Numbness   Left Wrist - Pain, Numbness    HPI patient is a pleasant 37 year old right-hand-dominant female who comes in today with bilateral hand paresthesias right greater than left.  She noticed paresthesias to the right hand about a month ago and tells me that the left hand just started.  She is a Runner, broadcasting/film/video.  The paresthesias he has are to all 5 fingers.  She also notes some discomfort to the base of the right thumb.  She has paresthesias that wake her at night as well as when she is driving.  She has tried night splints with some relief.  No previous nerve conduction study.  Review of Systems as detailed in HPI.  All others reviewed and are negative.   Objective: Vital Signs: There were no vitals taken for this visit.  Physical Exam well-developed well-nourished female no acute distress.  Alert and oriented x 3.  Ortho Exam bilateral hand exam: Positive Phalen and Tinel on the  right.  No pain to the first [facility] joint.  Negative Finkelstein.  No tenderness to the first dorsal compartment.  Negative Tinel and Phalen on the left.  She is neurovascularly intact distally.  Specialty Comments:  No specialty comments available.  Imaging: No new imaging   PMFS History: Patient Active Problem List   Diagnosis Date Noted   BMI 32.0-32.9,adult 01/01/2023   Stress 09/23/2022   Prediabetes 08/06/2022   Vitamin D deficiency 08/06/2022   Morbid obesity (HCC) 08/06/2022   Depression 05/16/2022   Insulin resistance 11/19/2021   Polyphagia 07/11/2021   S/P thyroidectomy 01/31/2020   Migraine without aura 01/20/2017   Active labor 10/09/2013   Abnormal fetal ultrasound 12/15/2011   Small for dates affecting management of mother 12/15/2011   Abnormal genetic test in pregnancy 12/03/2011   Pregnancy, supervision of, high-risk 12/03/2011   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   Past Medical History:  Diagnosis Date   Back pain    Constipation    Edema of both lower legs    Headache    Herpes    IBS (irritable bowel syndrome)    Memory change    No pertinent past medical history    Thyroid condition     Family History  Problem Relation Age of Onset   High blood pressure  Mother    Anxiety disorder Mother    Diabetes Father    High blood pressure Father    High Cholesterol Father    Anxiety disorder Father    Sleep apnea Father    Heart disease Maternal Grandmother    Anesthesia problems Neg Hx     Past Surgical History:  Procedure Laterality Date   CESAREAN SECTION     CESAREAN SECTION  12/19/2011   Procedure: CESAREAN SECTION;  Surgeon: Tilda Burrow, MD;  Location: [facility];  Service: Gynecology;  Laterality: N/A;   EYE SURGERY     per patient "laser treatment to boyh eyes about 2-3 years ago in the summer"   THYROIDECTOMY Left 01/31/2020   Procedure: LEFT THYROID LOBECTOMY WITH FROZEN SECTION;  Surgeon: Serena Colonel, MD;  Location: [facility];   Service: ENT;  Laterality: Left;   Social History   Occupational History   Occupation: Midwife, Consulting civil engineer, Mommy  Tobacco Use   Smoking status: Never   Smokeless tobacco: Never  Vaping Use   Vaping status: Never Used  Substance and Sexual Activity   Alcohol use: Yes    Comment: 01/28/2020: "very rare"   Drug use: No   Sexual activity: Yes    Birth control/protection: None

## 2023-08-20 ENCOUNTER — Telehealth (INDEPENDENT_AMBULATORY_CARE_PROVIDER_SITE_OTHER): Payer: BC Managed Care – PPO | Admitting: Family Medicine

## 2023-08-20 ENCOUNTER — Encounter (INDEPENDENT_AMBULATORY_CARE_PROVIDER_SITE_OTHER): Payer: Self-pay | Admitting: Family Medicine

## 2023-08-20 DIAGNOSIS — E669 Obesity, unspecified: Secondary | ICD-10-CM

## 2023-08-20 DIAGNOSIS — Z6832 Body mass index (BMI) 32.0-32.9, adult: Secondary | ICD-10-CM

## 2023-08-20 DIAGNOSIS — F5089 Other specified eating disorder: Secondary | ICD-10-CM

## 2023-08-20 DIAGNOSIS — F3289 Other specified depressive episodes: Secondary | ICD-10-CM

## 2023-08-20 DIAGNOSIS — E88819 Insulin resistance, unspecified: Secondary | ICD-10-CM

## 2023-08-20 MED ORDER — TOPIRAMATE 50 MG PO TABS: 50.0000 mg | ORAL_TABLET | Freq: Two times a day (BID) | ORAL | 0 refills | Status: DC

## 2023-08-20 MED ORDER — BUPROPION HCL ER (SR) 200 MG PO TB12
200.0000 mg | ORAL_TABLET | Freq: Two times a day (BID) | ORAL | 0 refills | Status: DC
Start: 2023-08-20 — End: 2023-10-30

## 2023-08-20 MED ORDER — ESCITALOPRAM OXALATE 20 MG PO TABS
20.0000 mg | ORAL_TABLET | Freq: Every day | ORAL | 0 refills | Status: DC
Start: 2023-08-20 — End: 2023-10-30

## 2023-08-20 MED ORDER — METFORMIN HCL 500 MG PO TABS: ORAL_TABLET | ORAL | 0 refills | Status: DC

## 2023-08-20 NOTE — Progress Notes (Signed)
TeleHealth Visit:  Due to the COVID-19 pandemic, this visit was completed with telemedicine (audio/video) technology to reduce patient and provider exposure as well as to preserve personal protective equipment.   Leah Schneider has verbally consented to this TeleHealth visit. The patient is located at home, the provider is located at the [facility] office. The participants in this visit include the listed provider and patient. The visit was conducted today via MyChart video.   Chief Complaint: OBESITY Leah Schneider is here to discuss her progress with her obesity treatment plan along with follow-up of her obesity related diagnoses. Leah Schneider is on following a lower carbohydrate, vegetable and lean protein rich diet plan and states she is following her eating plan approximately 80% of the time. Leah Schneider states she is walking 3 miles for 90 minutes 7 times per week.  Today's visit was #: 28 Starting weight: 241 lbs Starting date: 05/08/2021  Interim History: Was unable to concentrate on her weight loss.  She has increased stress and following her eating plan is not currently possible.  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  Subjective:   1. Insulin resistance Patient is on metformin with no nausea or vomiting.  She is struggling with her eating plan due to tight finances.  She will be increasing her exercise soon with her new job.  2. Emotional Eating Behavior Patient notes stress is still high.  She has not been able to concentrate on weight loss as much, but she feels her medications have been helping her.  Assessment/Plan:   1. Insulin resistance Patient will continue metformin 500 mg twice daily, and we will refill for 90 days.  - metFORMIN (GLUCOPHAGE) 500 MG tablet; TAKE 1 TABLET BY MOUTH TWICE DAILY WITH A MEAL  Strength: 500 mg  Dispense: 180 tablet; Refill: 0  2. Emotional Eating  Behavior Patient will continue her medications, and we will refill Wellbutrin SR 200 mg twice daily, Lexapro 20 mg once daily, and Topamax 50 mg twice daily for 90 days.  - buPROPion (WELLBUTRIN SR) 200 MG 12 hr tablet; Take 1 tablet (200 mg total) by mouth 2 (two) times daily.  Dispense: 180 tablet; Refill: 0 - escitalopram (LEXAPRO) 20 MG tablet; Take 1 tablet (20 mg total) by mouth daily.  Dispense: 90 tablet; Refill: 0 - topiramate (TOPAMAX) 50 MG tablet; Take 1 tablet (50 mg total) by mouth 2 (two) times daily.  Dispense: 180 tablet; Refill: 0  3. BMI 32.0-32.9,adult  4. Obesity, current BMI 32.95 Leah Schneider is currently in the action stage of change. As such, her goal is to continue with weight loss efforts. She has agreed to practicing portion control and making smarter food choices, such as increasing vegetables and decreasing simple carbohydrates.   Patient was advised to look into food pantry's at least temporarily while finances are so tight.  Exercise goals: As is.   Behavioral modification strategies: increasing lean protein intake and no skipping meals.  Leah Schneider has agreed to follow-up with our clinic in 12 weeks. She was informed of the importance of frequent follow-up visits to maximize her success with intensive lifestyle modifications for her multiple health conditions.  Objective:   VITALS: Per patient if applicable, see vitals. GENERAL: Alert and in no acute distress. CARDIOPULMONARY: No increased WOB. Speaking in clear sentences.  PSYCH: Pleasant and cooperative. Speech normal rate and rhythm. Affect is appropriate. Insight and judgement are appropriate. Attention  is focused, linear, and appropriate.  NEURO: Oriented as arrived to appointment on time with no prompting.   Lab Results  Component Value Date   CREATININE 0.91 06/04/2023   BUN 20 06/04/2023   NA 140 06/04/2023   K 4.7 06/04/2023   CL 104 06/04/2023   CO2 23 06/04/2023   Lab Results  Component Value  Date   ALT 16 06/04/2023   AST 17 06/04/2023   ALKPHOS 79 06/04/2023   BILITOT <0.2 06/04/2023   Lab Results  Component Value Date   HGBA1C 5.0 06/04/2023   HGBA1C 5.0 08/06/2022   HGBA1C 5.1 10/18/2021   HGBA1C 5.3 05/08/2021   Lab Results  Component Value Date   INSULIN 6.0 06/04/2023   INSULIN 5.5 08/06/2022   INSULIN 6.8 10/18/2021   INSULIN 21.1 05/08/2021   Lab Results  Component Value Date   TSH 2.220 08/06/2022   Lab Results  Component Value Date   CHOL 124 06/04/2023   HDL 51 06/04/2023   LDLCALC 58 06/04/2023   TRIG 73 06/04/2023   Lab Results  Component Value Date   VD25OH 89.7 06/04/2023   VD25OH 44.3 08/06/2022   VD25OH 47.1 10/18/2021   Lab Results  Component Value Date   WBC 7.2 05/08/2021   HGB 15.1 05/08/2021   HCT 45.9 05/08/2021   MCV 88 05/08/2021   PLT 243 05/08/2021   No results found for: "IRON", "TIBC", "FERRITIN"  Attestation Statements:   Reviewed by clinician on day of visit: allergies, medications, problem list, medical history, surgical history, family history, social history, and previous encounter notes.   I, Burt Knack, am acting as transcriptionist for Quillian Quince, MD.  I have reviewed the above documentation for accuracy and completeness, and I agree with the above. - Quillian Quince, MD

## 2023-09-10 ENCOUNTER — Ambulatory Visit (INDEPENDENT_AMBULATORY_CARE_PROVIDER_SITE_OTHER): Payer: BC Managed Care – PPO | Admitting: Physical Medicine and Rehabilitation

## 2023-09-10 DIAGNOSIS — M79642 Pain in left hand: Secondary | ICD-10-CM | POA: Diagnosis not present

## 2023-09-10 DIAGNOSIS — M79641 Pain in right hand: Secondary | ICD-10-CM | POA: Diagnosis not present

## 2023-09-10 DIAGNOSIS — M79644 Pain in right finger(s): Secondary | ICD-10-CM

## 2023-09-10 DIAGNOSIS — R202 Paresthesia of skin: Secondary | ICD-10-CM | POA: Diagnosis not present

## 2023-09-10 DIAGNOSIS — G8929 Other chronic pain: Secondary | ICD-10-CM

## 2023-09-10 NOTE — Progress Notes (Signed)
Functional Pain Scale - descriptive words and definitions   Severe (9)  Cannot do any ADL's even with assistance can barely talk/unable to sleep and unable to use distraction. Severe range order  Average Pain 9  Bilat UE NCS, pain, numbness and tingling in both hands. She has difficultly grasping and drops things.

## 2023-09-16 ENCOUNTER — Encounter: Payer: Self-pay | Admitting: Physical Medicine and Rehabilitation

## 2023-09-16 NOTE — Procedures (Signed)
EMG & NCV Findings: Evaluation of the right median motor nerve showed prolonged distal onset latency (5.4 ms) and decreased conduction velocity (Elbow-Wrist, 46 m/s).  The left median (across palm) sensory and the right median (across palm) sensory nerves showed prolonged distal peak latency (Wrist, L3.7, R4.9 ms).  All remaining nerves (as indicated in the following tables) were within normal limits.  Left vs. Right side comparison data for the median motor nerve indicates abnormal L-R latency difference (1.6 ms).    All examined muscles (as indicated in the following table) showed no evidence of electrical instability.    Impression: The above electrodiagnostic study is ABNORMAL and reveals evidence of:  a moderate right median nerve entrapment at the wrist (carpal tunnel syndrome) affecting sensory and motor components.   a mild left median nerve entrapment at the wrist (carpal tunnel syndrome) affecting sensory components.   There is no significant electrodiagnostic evidence of any other focal nerve entrapment, brachial plexopathy or cervical radiculopathy.   Recommendations: 1.  Follow-up with referring physician. 2.  Continue current management of symptoms. 3.  Continue use of resting splint at night-time and as needed during the day. 4.  Suggest surgical evaluation.  ___________________________ Naaman Plummer FAAPMR Board Certified, American Board of Physical Medicine and Rehabilitation    Nerve Conduction Studies Anti Sensory Summary Table   Stim Site NR Peak (ms) Norm Peak (ms) P-T Amp (V) Norm P-T Amp Site1 Site2 Delta-P (ms) Dist (cm) Vel (m/s) Norm Vel (m/s)  Left Median Acr Palm Anti Sensory (2nd Digit)  31.3C  Wrist    *3.7 <3.6 32.4 >10 Wrist Palm 1.9 0.0    Palm    1.8 <2.0 6.4         Right Median Acr Palm Anti Sensory (2nd Digit)  30.6C  Wrist    *4.9 <3.6 21.0 >10 Wrist Palm 3.0 0.0    Palm    1.9 <2.0 27.1         Right Radial Anti Sensory (Base 1st Digit)   31.3C  Wrist    2.0 <3.1 22.8  Wrist Base 1st Digit 2.0 0.0    Right Ulnar Anti Sensory (5th Digit)  31.4C  Wrist    3.0 <3.7 33.2 >15.0 Wrist 5th Digit 3.0 14.0 47 >38   Motor Summary Table   Stim Site NR Onset (ms) Norm Onset (ms) O-P Amp (mV) Norm O-P Amp Site1 Site2 Delta-0 (ms) Dist (cm) Vel (m/s) Norm Vel (m/s)  Left Median Motor (Abd Poll Brev)  31.9C  Wrist    3.8 <4.2 6.8 >5 Elbow Wrist 3.6 19.0 53 >50  Elbow    7.4  7.2         Right Median Motor (Abd Poll Brev)  31.5C  Wrist    *5.4 <4.2 8.8 >5 Elbow Wrist 4.1 19.0 *46 >50  Elbow    9.5  8.6         Right Ulnar Motor (Abd Dig Min)  31.8C  Wrist    2.7 <4.2 9.2 >3 B Elbow Wrist 3.1 19.5 63 >53  B Elbow    5.8  9.3  A Elbow B Elbow 1.1 10.0 91 >53  A Elbow    6.9  9.3          EMG   Side Muscle Nerve Root Ins Act Fibs Psw Amp Dur Poly Recrt Int Dennie Bible Comment  Right Abd Poll Brev Median C8-T1 Nml Nml Nml Nml Nml 0 Nml Nml   Right 1stDorInt Ulnar C8-T1  Nml Nml Nml Nml Nml 0 Nml Nml   Right PronatorTeres Median C6-7 Nml Nml Nml Nml Nml 0 Nml Nml   Right Biceps Musculocut C5-6 Nml Nml Nml Nml Nml 0 Nml Nml   Right Deltoid Axillary C5-6 Nml Nml Nml Nml Nml 0 Nml Nml     Nerve Conduction Studies Anti Sensory Left/Right Comparison   Stim Site L Lat (ms) R Lat (ms) L-R Lat (ms) L Amp (V) R Amp (V) L-R Amp (%) Site1 Site2 L Vel (m/s) R Vel (m/s) L-R Vel (m/s)  Median Acr Palm Anti Sensory (2nd Digit)  31.3C  Wrist *3.7 *4.9 1.2 32.4 21.0 35.2 Wrist Palm     Palm 1.8 1.9 0.1 6.4 27.1 76.4       Radial Anti Sensory (Base 1st Digit)  31.3C  Wrist  2.0   22.8  Wrist Base 1st Digit     Ulnar Anti Sensory (5th Digit)  31.4C  Wrist  3.0   33.2  Wrist 5th Digit  47    Motor Left/Right Comparison   Stim Site L Lat (ms) R Lat (ms) L-R Lat (ms) L Amp (mV) R Amp (mV) L-R Amp (%) Site1 Site2 L Vel (m/s) R Vel (m/s) L-R Vel (m/s)  Median Motor (Abd Poll Brev)  31.9C  Wrist 3.8 *5.4 *1.6 6.8 8.8 22.7 Elbow Wrist 53 *46 7   Elbow 7.4 9.5 2.1 7.2 8.6 16.3       Ulnar Motor (Abd Dig Min)  31.8C  Wrist  2.7   9.2  B Elbow Wrist  63   B Elbow  5.8   9.3  A Elbow B Elbow  91   A Elbow  6.9   9.3           Waveforms:

## 2023-09-16 NOTE — Progress Notes (Signed)
Leah Schneider - 37 y.o. female MRN [id number]  Date of birth: [birth year]  Office Visit Note: Visit Date: 09/10/2023 PCP: Sigmund Hazel, MD Referred by: Tarry Kos, MD  Subjective: Chief Complaint  Patient presents with   Right Hand - Pain, Numbness, Tingling   Left Hand - Pain, Numbness, Tingling   HPI: Leah Schneider is a 37 y.o. female who comes in today at the request of Dr. Glee Arvin for evaluation and management of chronic, worsening and severe pain, numbness and tingling in the Bilateral upper extremities.  Patient is Right hand dominant.  She works as a Chartered loss adjuster and has noted symptoms in the right more than left hands for quite some time now but worsening since September of this year.  No specific injury per se but she does use her hands quite a bit at work.  She has been doing splinting more recently on the left hand.  Left hand is much newer symptoms in the right.  She does endorse some neck pain but no symptoms down the arms.  The paresthesias and numbness are globally in the entire hand there is no nerve distribution or dermatomal distribution noted.  No prior electrodiagnostic study.  She does have a history of hypothyroid status post thyroidectomy.  She is prediabetic but with normal hemoglobin A1c's.  No history of polyneuropathy.  She does get some pain at the base of the thumbs.   I spent more than 30 minutes speaking face-to-face with the patient with 50% of the time in counseling and discussing coordination of care.      Review of Systems  Musculoskeletal:  Positive for joint pain.  Neurological:  Positive for tingling and weakness.  All other systems reviewed and are negative.  Otherwise per HPI.  Assessment & Plan: Visit Diagnoses:    ICD-10-CM   1. Paresthesia of skin  R20.2 NCV with EMG (electromyography)    2. Bilateral hand pain  M79.641    M79.642     3. 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  Electrodiagnostic study performed today.  The above electrodiagnostic study is ABNORMAL and reveals evidence of:  a moderate right median nerve entrapment at the wrist (carpal tunnel syndrome) affecting sensory and motor components.   a mild left median nerve entrapment at the wrist (carpal tunnel syndrome) affecting sensory components.   There is no significant electrodiagnostic evidence of any other focal nerve entrapment, brachial plexopathy or cervical radiculopathy.   Recommendations: 1.  Follow-up with referring physician. 2.  Continue current management of symptoms. 3.  Continue use of resting splint at night-time and as needed during the day. 4.  Suggest surgical evaluation.  Meds & Orders: No orders of the defined types were placed in this encounter.   Orders Placed This Encounter  Procedures   NCV with EMG (electromyography)    Follow-up: Return for  Glee Arvin, MD.   Procedures: No procedures performed  EMG & NCV Findings: Evaluation of the right median motor nerve showed prolonged distal onset latency (5.4 ms) and decreased conduction velocity (Elbow-Wrist, 46 m/s).  The left median (across palm) sensory and the right median (across palm) sensory nerves showed prolonged distal peak latency (Wrist, L3.7, R4.9 ms).  All remaining nerves (as indicated  in the following tables) were within normal limits.  Left vs. Right side comparison data for the median motor nerve indicates abnormal L-R latency difference (1.6 ms).    All examined muscles (as indicated in the following table) showed no evidence of electrical instability.    Impression: The above electrodiagnostic study is ABNORMAL and reveals evidence of:  a moderate right median nerve entrapment at the wrist (carpal tunnel  syndrome) affecting sensory and motor components.   a mild left median nerve entrapment at the wrist (carpal tunnel syndrome) affecting sensory components.   There is no significant electrodiagnostic evidence of any other focal nerve entrapment, brachial plexopathy or cervical radiculopathy.   Recommendations: 1.  Follow-up with referring physician. 2.  Continue current management of symptoms. 3.  Continue use of resting splint at night-time and as needed during the day. 4.  Suggest surgical evaluation.  ___________________________ Naaman Plummer FAAPMR Board Certified, American Board of Physical Medicine and Rehabilitation    Nerve Conduction Studies Anti Sensory Summary Table   Stim Site NR Peak (ms) Norm Peak (ms) P-T Amp (V) Norm P-T Amp Site1 Site2 Delta-P (ms) Dist (cm) Vel (m/s) Norm Vel (m/s)  Left Median Acr Palm Anti Sensory (2nd Digit)  31.3C  Wrist    *3.7 <3.6 32.4 >10 Wrist Palm 1.9 0.0    Palm    1.8 <2.0 6.4         Right Median Acr Palm Anti Sensory (2nd Digit)  30.6C  Wrist    *4.9 <3.6 21.0 >10 Wrist Palm 3.0 0.0    Palm    1.9 <2.0 27.1         Right Radial Anti Sensory (Base 1st Digit)  31.3C  Wrist    2.0 <3.1 22.8  Wrist Base 1st Digit 2.0 0.0    Right Ulnar Anti Sensory (5th Digit)  31.4C  Wrist    3.0 <3.7 33.2 >15.0 Wrist 5th Digit 3.0 14.0 47 >38   Motor Summary Table   Stim Site NR Onset (ms) Norm Onset (ms) O-P Amp (mV) Norm O-P Amp Site1 Site2 Delta-0 (ms) Dist (cm) Vel (m/s) Norm Vel (m/s)  Left Median Motor (Abd Poll Brev)  31.9C  Wrist    3.8 <4.2 6.8 >5 Elbow Wrist 3.6 19.0 53 >50  Elbow    7.4  7.2         Right Median Motor (Abd Poll Brev)  31.5C  Wrist    *5.4 <4.2 8.8 >5 Elbow Wrist 4.1 19.0 *46 >50  Elbow    9.5  8.6         Right Ulnar Motor (Abd Dig Min)  31.8C  Wrist    2.7 <4.2 9.2 >3 B Elbow Wrist 3.1 19.5 63 >53  B Elbow    5.8  9.3  A Elbow B Elbow 1.1 10.0 91 >53  A Elbow    6.9  9.3          EMG   Side Muscle Nerve  Root Ins Act Fibs Psw Amp Dur Poly Recrt Int Dennie Bible Comment  Right Abd Poll Brev Median C8-T1 Nml Nml Nml Nml Nml 0 Nml Nml   Right 1stDorInt Ulnar C8-T1 Nml Nml Nml Nml Nml 0 Nml Nml   Right PronatorTeres Median C6-7 Nml Nml Nml Nml Nml 0 Nml Nml   Right Biceps Musculocut C5-6 Nml Nml Nml Nml Nml 0 Nml Nml   Right Deltoid Axillary C5-6 Nml Nml Nml Nml Nml 0 Nml Nml  Nerve Conduction Studies Anti Sensory Left/Right Comparison   Stim Site L Lat (ms) R Lat (ms) L-R Lat (ms) L Amp (V) R Amp (V) L-R Amp (%) Site1 Site2 L Vel (m/s) R Vel (m/s) L-R Vel (m/s)  Median Acr Palm Anti Sensory (2nd Digit)  31.3C  Wrist *3.7 *4.9 1.2 32.4 21.0 35.2 Wrist Palm     Palm 1.8 1.9 0.1 6.4 27.1 76.4       Radial Anti Sensory (Base 1st Digit)  31.3C  Wrist  2.0   22.8  Wrist Base 1st Digit     Ulnar Anti Sensory (5th Digit)  31.4C  Wrist  3.0   33.2  Wrist 5th Digit  47    Motor Left/Right Comparison   Stim Site L Lat (ms) R Lat (ms) L-R Lat (ms) L Amp (mV) R Amp (mV) L-R Amp (%) Site1 Site2 L Vel (m/s) R Vel (m/s) L-R Vel (m/s)  Median Motor (Abd Poll Brev)  31.9C  Wrist 3.8 *5.4 *1.6 6.8 8.8 22.7 Elbow Wrist 53 *46 7  Elbow 7.4 9.5 2.1 7.2 8.6 16.3       Ulnar Motor (Abd Dig Min)  31.8C  Wrist  2.7   9.2  B Elbow Wrist  63   B Elbow  5.8   9.3  A Elbow B Elbow  91   A Elbow  6.9   9.3           Waveforms:                 Clinical History: No specialty comments available.   She reports that she has never smoked. She has never used smokeless tobacco.  Recent Labs    06/04/23 1010  HGBA1C 5.0    Objective:  VS:  HT:    WT:   BMI:     BP:   HR: bpm  TEMP: ( )  RESP:  Physical Exam Vitals and nursing note reviewed.  Constitutional:      General: She is not in acute distress.    Appearance: Normal appearance. She is well-developed. She is obese. She is not ill-appearing.  HENT:     Head: Normocephalic and atraumatic.  Eyes:     Conjunctiva/sclera: Conjunctivae  normal.     Pupils: Pupils are equal, round, and reactive to light.  Cardiovascular:     Rate and Rhythm: Normal rate.     Pulses: Normal pulses.  Pulmonary:     Effort: Pulmonary effort is normal.  Musculoskeletal:        General: Tenderness present. No swelling or deformity.     Right lower leg: No edema.     Left lower leg: No edema.     Comments: Inspection reveals no atrophy of the bilateral APB or FDI or hand intrinsics. There is no swelling, color changes, allodynia or dystrophic changes. There is 5 out of 5 strength in the bilateral wrist extension, finger abduction and long finger flexion. There is intact sensation to light touch in all dermatomal and peripheral nerve distributions. There is a negative Froment's test bilaterally. There is a negative Tinel's test at the bilateral wrist and elbow. There is a positive Phalen's test bilaterally. There is a negative Hoffmann's test bilaterally.  Skin:    General: Skin is warm and dry.     Findings: No erythema or rash.  Neurological:     General: No focal deficit present.     Mental Status: She is alert and oriented to person,  place, and time.     Cranial Nerves: No cranial nerve deficit.     Sensory: No sensory deficit.     Motor: No weakness or abnormal muscle tone.     Coordination: Coordination normal.     Gait: Gait normal.  Psychiatric:        Mood and Affect: Mood normal.        Behavior: Behavior normal.     Ortho Exam  Imaging: No results found.  Past Medical/Family/Surgical/Social History: Medications & Allergies reviewed per EMR, new medications updated. 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   Past Medical History:  Diagnosis Date   Back pain    Constipation    Edema of both lower legs    Headache    Herpes    IBS (irritable bowel syndrome)    Memory change    No pertinent past medical history    Thyroid condition    Family History  Problem Relation Age of Onset   High blood pressure Mother    Anxiety disorder Mother    Diabetes Father    High blood pressure Father    High Cholesterol Father    Anxiety disorder Father    Sleep apnea Father    Heart disease Maternal Grandmother    Anesthesia problems Neg Hx    Past Surgical History:  Procedure Laterality Date   CESAREAN SECTION     CESAREAN SECTION  12/19/2011   Procedure: CESAREAN SECTION;  Surgeon: Tilda Burrow, MD;  Location: [facility];  Service: Gynecology;  Laterality: N/A;   EYE SURGERY     per patient "laser treatment to boyh eyes about 2-3 years ago in the summer"   THYROIDECTOMY Left 01/31/2020   Procedure: LEFT THYROID LOBECTOMY WITH FROZEN SECTION;  Surgeon: Serena Colonel, MD;  Location: [facility] OR;  Service: ENT;  Laterality: Left;   Social History   Occupational History   Occupation: Midwife, Consulting civil engineer, Mommy  Tobacco Use   Smoking status: Never   Smokeless tobacco: Never  Vaping Use   Vaping status: Never Used  Substance and Sexual Activity   Alcohol use: Yes    Comment: 01/28/2020: "very rare"   Drug use: No   Sexual activity: Yes    Birth control/protection: None

## 2023-09-18 ENCOUNTER — Ambulatory Visit: Payer: BC Managed Care – PPO | Admitting: Orthopaedic Surgery

## 2023-09-18 ENCOUNTER — Encounter: Payer: Self-pay | Admitting: Orthopaedic Surgery

## 2023-09-18 DIAGNOSIS — G5601 Carpal tunnel syndrome, right upper limb: Secondary | ICD-10-CM | POA: Diagnosis not present

## 2023-09-18 DIAGNOSIS — G5602 Carpal tunnel syndrome, left upper limb: Secondary | ICD-10-CM

## 2023-09-18 NOTE — Progress Notes (Signed)
Office Visit Note   Patient: Leah Schneider           Date of Birth: December 18, 1985           MRN: [id number] Visit Date: 09/18/2023              Requested by: Sigmund Hazel, MD [address] [place],  [place] [address] PCP: Sigmund Hazel, MD   Assessment & Plan: Visit Diagnoses:  1. Right carpal tunnel syndrome   2. Left carpal tunnel syndrome     Plan: EMGs confirm the patient has bilateral carpal tunnel syndrome moderate on the right and mild on the left.  Based on these findings treatment options were discussed and I have recommended carpal tunnel release of the right hand.  Patient has elected to move forward with scheduling for that surgery.  Risk benefits prognosis reviewed.  Eunice Blase will call the patient to confirm surgery date.  Follow-Up Instructions: No follow-ups on file.   Orders:  No orders of the defined types were placed in this encounter.  No orders of the defined types were placed in this encounter.     Procedures: No procedures performed   Clinical Data: No additional findings.   Subjective: Chief Complaint  Patient presents with   Right Hand - Follow-up   Left Hand - Follow-up    HPI Marliss returns today to review EMGs for carpal tunnel syndrome.  She reports that her symptoms are getting worse.  She feels numbness when driving and she is unable to sleep  Review of Systems   Objective: Vital Signs: There were no vitals taken for this visit.  Physical Exam  Ortho Exam Exam of both hands unchanged from prior visit. Specialty Comments:  No specialty comments available.  Imaging: No results found.   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   Past Medical History:  Diagnosis Date   Back pain    Constipation    Edema of both lower legs    Headache    Herpes    IBS (irritable bowel syndrome)    Memory change    No pertinent past medical history    Thyroid condition     Family History  Problem Relation Age of Onset   High blood pressure Mother    Anxiety disorder Mother    Diabetes Father    High blood pressure Father    High Cholesterol Father    Anxiety disorder Father    Sleep apnea Father    Heart disease Maternal Grandmother    Anesthesia problems Neg Hx     Past Surgical History:  Procedure Laterality Date   CESAREAN SECTION     CESAREAN SECTION  12/19/2011   Procedure: CESAREAN SECTION;  Surgeon: Tilda Burrow, MD;  Location: [facility];  Service: Gynecology;  Laterality: N/A;   EYE SURGERY     per patient "laser treatment to boyh eyes about 2-3 years ago  in the summer"   THYROIDECTOMY Left 01/31/2020   Procedure: LEFT THYROID LOBECTOMY WITH FROZEN SECTION;  Surgeon: Serena Colonel, MD;  Location: [facility] OR;  Service: ENT;  Laterality: Left;   Social History   Occupational History   Occupation: Midwife, Consulting civil engineer, Mommy  Tobacco Use   Smoking status: Never   Smokeless tobacco: Never  Vaping Use   Vaping status: Never Used  Substance and Sexual Activity   Alcohol use: Yes    Comment: 01/28/2020: "very rare"   Drug use: No   Sexual activity: Yes    Birth control/protection: None

## 2023-10-14 ENCOUNTER — Other Ambulatory Visit: Payer: Self-pay | Admitting: Physician Assistant

## 2023-10-14 MED ORDER — ONDANSETRON HCL 4 MG PO TABS: 4.0000 mg | ORAL_TABLET | Freq: Three times a day (TID) | ORAL | 0 refills | Status: AC | PRN

## 2023-10-14 MED ORDER — HYDROCODONE-ACETAMINOPHEN 5-325 MG PO TABS: 1.0000 | ORAL_TABLET | Freq: Three times a day (TID) | ORAL | 0 refills | Status: AC | PRN

## 2023-10-15 ENCOUNTER — Other Ambulatory Visit: Payer: Self-pay | Admitting: Physician Assistant

## 2023-10-24 ENCOUNTER — Encounter: Payer: BC Managed Care – PPO | Admitting: Orthopaedic Surgery

## 2023-10-28 ENCOUNTER — Ambulatory Visit (INDEPENDENT_AMBULATORY_CARE_PROVIDER_SITE_OTHER): Payer: BC Managed Care – PPO | Admitting: Physician Assistant

## 2023-10-28 DIAGNOSIS — G5601 Carpal tunnel syndrome, right upper limb: Secondary | ICD-10-CM

## 2023-10-28 NOTE — Progress Notes (Signed)
 Post-Op Visit Note   Patient: Leah Schneider           Date of Birth: January 02, 1986           MRN: [id number] Visit Date: 10/28/2023 PCP: Cleotilde Planas, MD   Assessment & Plan:  Chief Complaint:  Chief Complaint  Patient presents with   Right Hand - Routine Post Op   Visit Diagnoses:  1. Right carpal tunnel syndrome     Plan: Is a pleasant 37 year old female who comes in today 12 days status post right carpal tunnel release 10/16/2023.  This was for moderate compression of the median nerve.  She has been doing okay.  She still notes paresthesias to her right thumb.  Examination of the right hand reveals a well-healed surgical incision with nylon sutures in place.  No evidence of infection or cellulitis.  Fingers warm well-perfused.  She does have decree sensation to the thumb.  Today, sutures were removed and Steri-Strips applied.  She has a Velcro splint for which she will wear when she returns to work.  She will avoid any submerging her hand underwater or heavy lifting until she is 4 weeks out from surgery.  Follow-up in 2 weeks for recheck.  Call with concerns or questions.  Follow-Up Instructions: Return in about 2 weeks (around 11/11/2023).   Orders:  No orders of the defined types were placed in this encounter.  No orders of the defined types were placed in this encounter.   Imaging: No results found.  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   Past Medical History:  Diagnosis Date   Back pain    Constipation    Edema of both lower legs    Headache    Herpes    IBS (irritable bowel syndrome)    Memory change    No pertinent past medical history    Thyroid  condition     Family History  Problem Relation Age of Onset   High blood pressure Mother    Anxiety disorder Mother    Diabetes Father    High blood pressure Father    High Cholesterol Father    Anxiety disorder Father    Sleep apnea Father    Heart disease Maternal Grandmother    Anesthesia problems Neg Hx     Past Surgical History:  Procedure Laterality Date   CESAREAN SECTION     CESAREAN SECTION  12/19/2011   Procedure: CESAREAN SECTION;  Surgeon: Norleen LULLA Server, MD;  Location: [facility];  Service: Gynecology;  Laterality: N/A;   EYE SURGERY     per patient laser treatment to boyh eyes about 2-3 years ago in the summer   THYROIDECTOMY Left 01/31/2020   Procedure: LEFT THYROID  LOBECTOMY WITH FROZEN SECTION;  Surgeon: Jesus Oliphant, MD;  Location: [facility] OR;  Service: ENT;  Laterality: Left;   Social History   Occupational History   Occupation: Midwife, Consulting Civil Engineer, Psychologist, Forensic  Tobacco Use   Smoking status: Never   Smokeless tobacco: Never  Vaping Use   Vaping status: Never Used  Substance and Sexual Activity   Alcohol use: Yes    Comment: 01/28/2020: very rare   Drug use: No   Sexual activity: Yes    Birth control/protection: None

## 2023-10-30 ENCOUNTER — Encounter (INDEPENDENT_AMBULATORY_CARE_PROVIDER_SITE_OTHER): Payer: Self-pay | Admitting: Family Medicine

## 2023-10-30 ENCOUNTER — Ambulatory Visit (INDEPENDENT_AMBULATORY_CARE_PROVIDER_SITE_OTHER): Payer: Self-pay | Admitting: Family Medicine

## 2023-10-30 VITALS — BP 106/68 | HR 68 | Temp 98.3°F | Ht 65.0 in | Wt 203.0 lb

## 2023-10-30 DIAGNOSIS — F3289 Other specified depressive episodes: Secondary | ICD-10-CM

## 2023-10-30 DIAGNOSIS — F5089 Other specified eating disorder: Secondary | ICD-10-CM

## 2023-10-30 DIAGNOSIS — F32A Depression, unspecified: Secondary | ICD-10-CM

## 2023-10-30 DIAGNOSIS — E669 Obesity, unspecified: Secondary | ICD-10-CM

## 2023-10-30 DIAGNOSIS — E038 Other specified hypothyroidism: Secondary | ICD-10-CM | POA: Insufficient documentation

## 2023-10-30 DIAGNOSIS — E88819 Insulin resistance, unspecified: Secondary | ICD-10-CM

## 2023-10-30 DIAGNOSIS — Z6833 Body mass index (BMI) 33.0-33.9, adult: Secondary | ICD-10-CM

## 2023-10-30 MED ORDER — BUPROPION HCL ER (SR) 200 MG PO TB12: 200.0000 mg | ORAL_TABLET | Freq: Two times a day (BID) | ORAL | 0 refills | Status: DC

## 2023-10-30 MED ORDER — WEGOVY 0.25 MG/0.5ML ~~LOC~~ SOAJ: 0.2500 mg | SUBCUTANEOUS | 0 refills | Status: DC

## 2023-10-30 MED ORDER — ESCITALOPRAM OXALATE 20 MG PO TABS: 20.0000 mg | ORAL_TABLET | Freq: Every day | ORAL | 0 refills | Status: DC

## 2023-10-30 MED ORDER — TOPIRAMATE 50 MG PO TABS: 50.0000 mg | ORAL_TABLET | Freq: Two times a day (BID) | ORAL | 0 refills | Status: DC

## 2023-10-30 MED ORDER — METFORMIN HCL 500 MG PO TABS: ORAL_TABLET | ORAL | 0 refills | Status: DC

## 2023-10-30 NOTE — Progress Notes (Signed)
 .smr  Office: [phone]  /  Fax: [phone]  WEIGHT SUMMARY AND BIOMETRICS  Anthropometric Measurements Height: 5' 5 (1.651 m) Weight: 203 lb (92.1 kg) BMI (Calculated): 33.78 Weight at Last Visit: 198 lb Weight Lost Since Last Visit: 0 Weight Gained Since Last Visit: 5 lb Starting Weight: 241 lb Total Weight Loss (lbs): 38 lb (17.2 kg)   Body Composition  Body Fat %: 41.3 % Fat Mass (lbs): 84 lbs Muscle Mass (lbs): 113.4 lbs Total Body Water (lbs): 82.6 lbs Visceral Fat Rating : 9   Other Clinical Data Fasting: no Labs: no Today's Visit #: 20    Chief Complaint: OBESITY  History of Present Illness   The patient, with a history of obesity, depression, emotional eating behaviors, and insulin  resistance, presents for a follow-up consultation. She is currently on Lexapro , Wellbutrin , and topiramate  for emotional eating behaviors, and metformin  for insulin  resistance. Despite efforts to improve her health through diet and exercise, she has gained approximately five pounds over the past four months, particularly during the holiday season. She reports no structured diet plan but is focusing on portion control and making healthier food choices. She engages in physical activity, such as gym workouts and walking, one to two times per week.  The patient expresses concern about her recent weight gain and is considering additional treatment options, including weight loss injections. She reports a family member's significant weight loss success with these injections, which has prompted her interest. However, she also expresses apprehension about the use of needles.  In addition to her weight concerns, the patient mentions a recent urinary tract infection and yeast infection. She has been consuming protein shakes and protein-rich yogurts as part of her dietary regimen. She also reports high levels of stress due to work and personal circumstances, which have contributed to her emotional  eating and subsequent weight gain.          PHYSICAL EXAM:  Blood pressure 106/68, pulse 68, temperature 98.3 F (36.8 C), height 5' 5 (1.651 m), weight 203 lb (92.1 kg), SpO2 99%. Body mass index is 33.78 kg/m.  DIAGNOSTIC DATA REVIEWED:  BMET    Component Value Date/Time   NA 140 06/04/2023 1010   K 4.7 06/04/2023 1010   CL 104 06/04/2023 1010   CO2 23 06/04/2023 1010   GLUCOSE 72 06/04/2023 1010   BUN 20 06/04/2023 1010   CREATININE 0.91 06/04/2023 1010   CALCIUM  9.2 06/04/2023 1010   GFRNONAA 106 01/20/2017 0851   GFRAA 122 01/20/2017 0851   Lab Results  Component Value Date   HGBA1C 5.0 06/04/2023   HGBA1C 5.3 05/08/2021   Lab Results  Component Value Date   INSULIN  6.0 06/04/2023   INSULIN  21.1 05/08/2021   Lab Results  Component Value Date   TSH 2.220 08/06/2022   CBC    Component Value Date/Time   WBC 7.2 05/08/2021 0914   WBC 7.6 01/31/2020 0626   RBC 5.23 05/08/2021 0914   RBC 4.98 01/31/2020 0626   HGB 15.1 05/08/2021 0914   HCT 45.9 05/08/2021 0914   PLT 243 05/08/2021 0914   MCV 88 05/08/2021 0914   MCH 28.9 05/08/2021 0914   MCH 28.9 01/31/2020 0626   MCHC 32.9 05/08/2021 0914   MCHC 32.5 01/31/2020 0626   RDW 11.8 05/08/2021 0914   Iron Studies No results found for: IRON, TIBC, FERRITIN, IRONPCTSAT Lipid Panel     Component Value Date/Time   CHOL 124 06/04/2023 1010   TRIG 73 06/04/2023 1010  HDL 51 06/04/2023 1010   LDLCALC 58 06/04/2023 1010   Hepatic Function Panel     Component Value Date/Time   PROT 6.4 06/04/2023 1010   ALBUMIN 4.0 06/04/2023 1010   AST 17 06/04/2023 1010   ALT 16 06/04/2023 1010   ALKPHOS 79 06/04/2023 1010   BILITOT <0.2 06/04/2023 1010      Component Value Date/Time   TSH 2.220 08/06/2022 0943   Nutritional Lab Results  Component Value Date   VD25OH 89.7 06/04/2023   VD25OH 44.3 08/06/2022   VD25OH 47.1 10/18/2021     Assessment and Plan    Obesity Gained approximately  five pounds over the holidays. Considering GLP-1 agonist therapy (Wegovy ) to assist with weight loss. Discussed GLP-1 mechanism, benefits, and risks, including malnutrition, pancreatitis, and medullary thyroid  cancer. Discussed cost and insurance coverage. - Submit prior authorization for Wegovy  - Educate on proper administration of Wegovy  - Recommend protein-first diet and hydration - Continue Lexapro , Wellbutrin , topiramate , and metformin   Depression with Emotional Eating On Lexapro  and Wellbutrin  for depression and topiramate  for emotional eating behaviors. Experiencing stress and emotional eating, particularly over the holidays. - Continue Lexapro , Wellbutrin , and topiramate  - Encourage maintaining healthy eating habits and managing stress  Insulin  Resistance On metformin  for insulin  resistance. Working on diet and exercise to improve this condition. - Continue metformin  - Encourage maintaining diet and exercise regimen  Follow-up - Schedule next two appointments - Contact via MyChart regarding prior authorization status for Wegovy .        She was informed of the importance of frequent follow up visits to maximize her success with intensive lifestyle modifications for her multiple health conditions.    Louann Penton, MD

## 2023-11-05 ENCOUNTER — Other Ambulatory Visit (INDEPENDENT_AMBULATORY_CARE_PROVIDER_SITE_OTHER): Payer: Self-pay | Admitting: Family Medicine

## 2023-11-05 ENCOUNTER — Encounter (INDEPENDENT_AMBULATORY_CARE_PROVIDER_SITE_OTHER): Payer: Self-pay | Admitting: *Deleted

## 2023-11-05 DIAGNOSIS — Z6833 Body mass index (BMI) 33.0-33.9, adult: Secondary | ICD-10-CM

## 2023-11-06 ENCOUNTER — Encounter (INDEPENDENT_AMBULATORY_CARE_PROVIDER_SITE_OTHER): Payer: Self-pay | Admitting: Family Medicine

## 2023-11-07 ENCOUNTER — Encounter: Payer: 59 | Admitting: Orthopaedic Surgery

## 2023-11-11 ENCOUNTER — Ambulatory Visit (INDEPENDENT_AMBULATORY_CARE_PROVIDER_SITE_OTHER): Payer: 59 | Admitting: Physician Assistant

## 2023-11-11 DIAGNOSIS — G5601 Carpal tunnel syndrome, right upper limb: Secondary | ICD-10-CM

## 2023-11-11 NOTE — Progress Notes (Signed)
 Post-Op Visit Note   Patient: Leah Schneider           Date of Birth: [birth year]           MRN: [id number] Visit Date: 11/11/2023 PCP: Cleotilde Planas, MD   Assessment & Plan:  Chief Complaint:  Chief Complaint  Patient presents with   Right Hand - Routine Post Op   Visit Diagnoses:  1. Right carpal tunnel syndrome     Plan: Patient is a pleasant 38 year old female who comes in today nearly 4 weeks status post right carpal tunnel release 10/16/2023.  She has been doing well.  Occasional discomfort over the incision as well as some persistent paresthesias to the tip of the thumb but no other complaints.  Examination of the right wrist reveals a fully healed surgical scar without complication.  At this point, we have listed restrictions.  Advance with activity as tolerated.  I have discussed that the paresthesias in the thumb could persist for a bit longer as she had moderate compression of the median nerve.  She will follow-up with us  as needed.  Call if concerns or questions.  Follow-Up Instructions: Return if symptoms worsen or fail to improve.   Orders:  No orders of the defined types were placed in this encounter.  No orders of the defined types were placed in this encounter.   Imaging: No new imaging  PMFS History: Patient Active Problem List   Diagnosis Date Noted   Other specified hypothyroidism 10/30/2023   BMI 33.0-33.9,adult 01/01/2023   Stress 09/23/2022   Prediabetes 08/06/2022   Vitamin D  deficiency 08/06/2022   Morbid obesity (HCC) 08/06/2022   Depression 05/16/2022   Insulin  resistance 11/19/2021   Polyphagia 07/11/2021   S/P thyroidectomy 01/31/2020   Migraine without aura 01/20/2017   Active labor 10/09/2013   Abnormal fetal ultrasound 12/15/2011   Small for dates affecting management of mother 12/15/2011   Abnormal genetic test in pregnancy 12/03/2011   Pregnancy, supervision of, high-risk 12/03/2011   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   Past Medical History:  Diagnosis Date   Back pain    Constipation    Edema of both lower legs    Headache    Herpes    IBS (irritable bowel syndrome)    Memory change    No pertinent past medical history    Thyroid  condition     Family History  Problem Relation Age of Onset   High blood pressure Mother    Anxiety disorder Mother    Diabetes Father    High blood pressure Father    High Cholesterol Father    Anxiety disorder Father    Sleep apnea Father    Heart disease Maternal Grandmother    Anesthesia problems Neg Hx     Past Surgical History:  Procedure Laterality Date   CESAREAN SECTION     CESAREAN SECTION  12/19/2011   Procedure: CESAREAN SECTION;  Surgeon: Norleen LULLA Server, MD;  Location: [facility];  Service: Gynecology;  Laterality: N/A;   EYE SURGERY     per patient laser treatment to boyh eyes about 2-3 years ago in the summer   THYROIDECTOMY Left 01/31/2020   Procedure: LEFT THYROID  LOBECTOMY WITH FROZEN SECTION;  Surgeon: Jesus Oliphant, MD;  Location: [facility];  Service: ENT;  Laterality: Left;   Social History   Occupational History   Occupation: Midwife, Consulting Civil Engineer, Mommy  Tobacco Use   Smoking status: Never   Smokeless tobacco: Never  Vaping  Use   Vaping status: Never Used  Substance and Sexual Activity   Alcohol use: Yes    Comment: 01/28/2020: very rare   Drug use: No   Sexual activity: Yes    Birth control/protection: None

## 2023-11-25 ENCOUNTER — Ambulatory Visit (INDEPENDENT_AMBULATORY_CARE_PROVIDER_SITE_OTHER): Payer: Self-pay | Admitting: Family Medicine

## 2023-11-25 ENCOUNTER — Encounter (INDEPENDENT_AMBULATORY_CARE_PROVIDER_SITE_OTHER): Payer: Self-pay | Admitting: Family Medicine

## 2023-11-25 DIAGNOSIS — E038 Other specified hypothyroidism: Secondary | ICD-10-CM

## 2023-11-25 DIAGNOSIS — E88819 Insulin resistance, unspecified: Secondary | ICD-10-CM

## 2023-11-25 DIAGNOSIS — F3289 Other specified depressive episodes: Secondary | ICD-10-CM

## 2023-11-25 DIAGNOSIS — Z7984 Long term (current) use of oral hypoglycemic drugs: Secondary | ICD-10-CM

## 2023-11-25 DIAGNOSIS — F5089 Other specified eating disorder: Secondary | ICD-10-CM | POA: Diagnosis not present

## 2023-11-25 DIAGNOSIS — E669 Obesity, unspecified: Secondary | ICD-10-CM | POA: Diagnosis not present

## 2023-11-25 DIAGNOSIS — Z6834 Body mass index (BMI) 34.0-34.9, adult: Secondary | ICD-10-CM

## 2023-11-25 DIAGNOSIS — E119 Type 2 diabetes mellitus without complications: Secondary | ICD-10-CM

## 2023-11-25 MED ORDER — OZEMPIC (0.25 OR 0.5 MG/DOSE) 2 MG/3ML ~~LOC~~ SOPN: 0.2500 mg | PEN_INJECTOR | SUBCUTANEOUS | 0 refills | Status: DC

## 2023-11-25 MED ORDER — TOPIRAMATE 50 MG PO TABS: 50.0000 mg | ORAL_TABLET | Freq: Two times a day (BID) | ORAL | 0 refills | Status: DC

## 2023-11-25 MED ORDER — BUPROPION HCL ER (SR) 200 MG PO TB12: 200.0000 mg | ORAL_TABLET | Freq: Two times a day (BID) | ORAL | 0 refills | Status: DC

## 2023-11-25 MED ORDER — METFORMIN HCL 500 MG PO TABS: ORAL_TABLET | ORAL | 0 refills | Status: DC

## 2023-11-25 NOTE — Progress Notes (Signed)
.smr  Office: [phone]  /  Fax: [phone]  WEIGHT SUMMARY AND BIOMETRICS  Anthropometric Measurements Height: 5\' 5"  (1.651 m) Weight: 206 lb (93.4 kg) BMI (Calculated): 34.28 Weight at Last Visit: 203 lb Weight Lost Since Last Visit: 0 Weight Gained Since Last Visit: 3 lb Starting Weight: 241 lb Total Weight Loss (lbs): 39 lb (17.7 kg)   Body Composition  Body Fat %: 41.8 % Fat Mass (lbs): 86.2 lbs Muscle Mass (lbs): 113.8 lbs Total Body Water (lbs): 85 lbs Visceral Fat Rating : 9   Other Clinical Data Fasting: no Labs: no Today's Visit #: 21    Chief Complaint: OBESITY   D History of Present Illness   The patient, with obesity and diabetes, presents to discuss emotional eating behaviors.  She has gained three pounds since her last visit and adheres to the category three eating plan 80% of the time. She is currently on Wegovy for polyphagia and metformin for diabetes. She faces challenges with meal planning and maintaining a routine, particularly after the holidays.  She experiences stress from household responsibilities, which increases cravings for sweets, particularly chocolate. 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 She is currently on Wellbutrin, Lexapro, and topiramate for emotional eating behaviors.  She discusses her insurance not covering [phone] and the challenges in obtaining coverage for medications. She is exploring options for medications that might be covered under her insurance plan.  Her current diet includes high-protein yogurts, mozzarella sticks, protein shakes, and Special K bars for breakfast. She avoids bread, opting for wraps or lettuce wraps, and is trying new dinner recipes. She mentions using a crockpot for meals like ranch pork loin with red potatoes and broccoli. She is also incorporating protein oatmeal and cucumbers into  her meals. She is considering working from home to manage her schedule better and has planned her meals to include high-protein options. She notes that she has a bag of [employer] chocolates due to stress and acknowledges the need to manage her stress and cravings better.          PHYSICAL EXAM:  Blood pressure 106/70, pulse 75, temperature 98.2 F (36.8 C), height 5\' 5"  (1.651 m), weight 206 lb (93.4 kg), SpO2 99%. Body mass index is 34.28 kg/m.  DIAGNOSTIC DATA REVIEWED:  BMET    Component Value Date/Time   NA 140 06/04/2023 1010   K 4.7 06/04/2023 1010   CL 104 06/04/2023 1010   CO2 23 06/04/2023 1010   GLUCOSE 72 06/04/2023 1010   BUN 20 06/04/2023 1010   CREATININE 0.91 06/04/2023 1010   CALCIUM 9.2 06/04/2023 1010   GFRNONAA 106 01/20/2017 0851   GFRAA 122 01/20/2017 0851   Lab Results  Component Value Date   HGBA1C 5.0 06/04/2023   HGBA1C 5.3 05/08/2021   Lab Results  Component Value Date   INSULIN 6.0 06/04/2023   INSULIN 21.1 05/08/2021   Lab Results  Component Value Date   TSH 2.220 08/06/2022   CBC    Component Value Date/Time   WBC 7.2 05/08/2021 0914   WBC 7.6 01/31/2020 0626   RBC 5.23 05/08/2021 0914   RBC 4.98 01/31/2020 0626   HGB 15.1 05/08/2021 0914   HCT 45.9 05/08/2021 0914   PLT 243 05/08/2021 0914   MCV 88 05/08/2021 0914   MCH 28.9 05/08/2021 0914   MCH 28.9 01/31/2020 0626   MCHC 32.9 05/08/2021 0914  MCHC 32.5 01/31/2020 0626   RDW 11.8 05/08/2021 0914   Iron Studies No results found for: "IRON", "TIBC", "FERRITIN", "IRONPCTSAT" Lipid Panel     Component Value Date/Time   CHOL 124 06/04/2023 1010   TRIG 73 06/04/2023 1010   HDL 51 06/04/2023 1010   LDLCALC 58 06/04/2023 1010   Hepatic Function Panel     Component Value Date/Time   PROT 6.4 06/04/2023 1010   ALBUMIN 4.0 06/04/2023 1010   AST 17 06/04/2023 1010   ALT 16 06/04/2023 1010   ALKPHOS 79 06/04/2023 1010   BILITOT <0.2 06/04/2023 1010      Component Value  Date/Time   TSH 2.220 08/06/2022 0943   Nutritional Lab Results  Component Value Date   VD25OH 89.7 06/04/2023   VD25OH 44.3 08/06/2022   VD25OH 47.1 10/18/2021     Assessment and Plan    Obesity and insulin resistance Presents with obesity and associated emotional eating behaviors. Gained three pounds since the last visit and adheres to a category three eating plan 80% of the time. Currently on Wellbutrin, Lexapro, and topiramate for emotional eating, and Wegovy for polyphagia. Insurance does not cover [employer]; exploring alternatives like Ozempic, which may be covered for weight loss if she has diabetes or prediabetes. Discussed the benefits of GLP-1 drugs for weight loss and glycemic control. - Prescribe Ozempic weekly - Refill Wellbutrin, Lexapro, and topiramate - Encourage meal planning and journaling - Set caloric goal at 1500 calories with at least 85 grams of protein daily - Verify insurance coverage for GLP-1 drugs  Type 2 Diabetes Mellitus On metformin for type 2 diabetes. Exploring GLP-1 drugs for additional glycemic control and weight loss benefits. Discussed potential benefits and insurance coverage issues. - Continue metformin - Explore insurance coverage for GLP-1 drugs like Ozempic  Emotional Eating Reports emotional eating exacerbated by stress and depression. Currently on Wellbutrin, Lexapro, and topiramate. Discussed the impact of stress on cravings and the importance of stress management techniques. - Continue Wellbutrin, Lexapro, and topiramate - Encourage stress management techniques and regular follow-up  General Health Maintenance Efforts to improve diet and exercise routine. Planning high-protein meals and snacks, trying new dinner recipes. Blood pressure is well-controlled. - Encourage high-protein diet and meal planning - Recommend simple and easy-to-journal meals - Monitor blood pressure and sodium intake  Follow-up - Follow-up appointment on March 4th  at 3:40 PM.        She was informed of the importance of frequent follow up visits to maximize her success with intensive lifestyle modifications for her multiple health conditions.    Quillian Quince, MD

## 2023-11-26 ENCOUNTER — Telehealth: Payer: Self-pay

## 2023-11-26 NOTE — Telephone Encounter (Signed)
PA submitted through Cover My Meds for Ozempic. Awaiting insurance determination. Key: [id number]

## 2023-11-28 ENCOUNTER — Encounter (INDEPENDENT_AMBULATORY_CARE_PROVIDER_SITE_OTHER): Payer: Self-pay | Admitting: Family Medicine

## 2023-12-01 ENCOUNTER — Encounter (INDEPENDENT_AMBULATORY_CARE_PROVIDER_SITE_OTHER): Payer: Self-pay | Admitting: Family Medicine

## 2023-12-02 ENCOUNTER — Ambulatory Visit (INDEPENDENT_AMBULATORY_CARE_PROVIDER_SITE_OTHER): Payer: Self-pay | Admitting: Family Medicine

## 2023-12-30 ENCOUNTER — Ambulatory Visit (INDEPENDENT_AMBULATORY_CARE_PROVIDER_SITE_OTHER): Payer: Self-pay | Admitting: Family Medicine

## 2024-01-26 ENCOUNTER — Encounter (INDEPENDENT_AMBULATORY_CARE_PROVIDER_SITE_OTHER): Payer: Self-pay | Admitting: Family Medicine

## 2024-01-26 ENCOUNTER — Ambulatory Visit (INDEPENDENT_AMBULATORY_CARE_PROVIDER_SITE_OTHER): Admitting: Family Medicine

## 2024-01-26 VITALS — BP 109/65 | HR 64 | Temp 98.0°F | Ht 65.0 in | Wt 204.0 lb

## 2024-01-26 DIAGNOSIS — F5089 Other specified eating disorder: Secondary | ICD-10-CM | POA: Diagnosis not present

## 2024-01-26 DIAGNOSIS — E88819 Insulin resistance, unspecified: Secondary | ICD-10-CM

## 2024-01-26 DIAGNOSIS — Z6833 Body mass index (BMI) 33.0-33.9, adult: Secondary | ICD-10-CM

## 2024-01-26 DIAGNOSIS — E669 Obesity, unspecified: Secondary | ICD-10-CM

## 2024-01-26 DIAGNOSIS — Z6834 Body mass index (BMI) 34.0-34.9, adult: Secondary | ICD-10-CM

## 2024-01-26 DIAGNOSIS — F3289 Other specified depressive episodes: Secondary | ICD-10-CM

## 2024-01-26 MED ORDER — BUPROPION HCL ER (SR) 200 MG PO TB12: 200.0000 mg | ORAL_TABLET | Freq: Two times a day (BID) | ORAL | 0 refills | Status: DC

## 2024-01-26 MED ORDER — METFORMIN HCL 500 MG PO TABS: ORAL_TABLET | ORAL | 0 refills | Status: DC

## 2024-01-26 MED ORDER — ESCITALOPRAM OXALATE 20 MG PO TABS: 20.0000 mg | ORAL_TABLET | Freq: Every day | ORAL | 0 refills | Status: DC

## 2024-01-26 MED ORDER — TOPIRAMATE 50 MG PO TABS: 50.0000 mg | ORAL_TABLET | Freq: Two times a day (BID) | ORAL | 0 refills | Status: DC

## 2024-01-26 MED ORDER — OZEMPIC (0.25 OR 0.5 MG/DOSE) 2 MG/3ML ~~LOC~~ SOPN: 0.2500 mg | PEN_INJECTOR | SUBCUTANEOUS | 0 refills | Status: DC

## 2024-01-26 NOTE — Progress Notes (Signed)
 Office: [phone]  /  Fax: [phone]  WEIGHT SUMMARY AND BIOMETRICS  Anthropometric Measurements Height: 5\' 5"  (1.651 m) Weight: 204 lb (92.5 kg) BMI (Calculated): 33.95 Weight at Last Visit: 206 lb Weight Lost Since Last Visit: 2 lb Weight Gained Since Last Visit: 0 Starting Weight: 241 lb Total Weight Loss (lbs): 41 lb (18.6 kg)   Body Composition  Body Fat %: 41.9 % Fat Mass (lbs): 85.6 lbs Muscle Mass (lbs): 112.6 lbs Total Body Water (lbs): 82.4 lbs Visceral Fat Rating : 9   Other Clinical Data Fasting: No Labs: No Today's Visit #: 22 Starting Date: 05/08/21    Chief Complaint: OBESITY  History of Present Illness Leah Schneider is a 38 year old female with obesity and insulin resistance who presents for obesity treatment plan and progress assessment.  She is adhering to a category two eating plan 95% of the time and has lost two pounds over the past two months. She is attempting to incorporate walking into her routine, although she has not yet established a formal exercise regimen. Stress and emotional eating have been challenges, but she hopes that recent changes in her living situation will reduce stress levels.  She has a history of insulin resistance and is currently being treated with metformin. She is working on diet and exercise to manage this condition and requests a refill of metformin.  She has a history of emotional eating behaviors and is being treated with Wellbutrin, Lexapro, and topiramate. She requests refills for all these medications and reports no issues with her current medications.  In terms of her diet, she consumes a protein shake, yogurt, and a slice of string cheese for breakfast. Lunch varies as she sometimes lacks appetite, opting for a protein bar or fruit like apples or grapes. She is aiming to improve meal planning and reduce junk food intake now that her roommate has moved out, which she hopes will decrease her stress levels.       PHYSICAL EXAM:  Blood pressure 109/65, pulse 64, temperature 98 F (36.7 C), height 5\' 5"  (1.651 m), weight 204 lb (92.5 kg), SpO2 98%. Body mass index is 33.95 kg/m.  DIAGNOSTIC DATA REVIEWED:  BMET    Component Value Date/Time   NA 140 06/04/2023 1010   K 4.7 06/04/2023 1010   CL 104 06/04/2023 1010   CO2 23 06/04/2023 1010   GLUCOSE 72 06/04/2023 1010   BUN 20 06/04/2023 1010   CREATININE 0.91 06/04/2023 1010   CALCIUM 9.2 06/04/2023 1010   GFRNONAA 106 01/20/2017 0851   GFRAA 122 01/20/2017 0851   Lab Results  Component Value Date   HGBA1C 5.0 06/04/2023   HGBA1C 5.3 05/08/2021   Lab Results  Component Value Date   INSULIN 6.0 06/04/2023   INSULIN 21.1 05/08/2021   Lab Results  Component Value Date   TSH 2.220 08/06/2022   CBC    Component Value Date/Time   WBC 7.2 05/08/2021 0914   WBC 7.6 01/31/2020 0626   RBC 5.23 05/08/2021 0914   RBC 4.98 01/31/2020 0626   HGB 15.1 05/08/2021 0914   HCT 45.9 05/08/2021 0914   PLT 243 05/08/2021 0914   MCV 88 05/08/2021 0914   MCH 28.9 05/08/2021 0914   MCH 28.9 01/31/2020 0626   MCHC 32.9 05/08/2021 0914   MCHC 32.5 01/31/2020 0626   RDW 11.8 05/08/2021 0914   Iron Studies No results found for: "IRON", "TIBC", "FERRITIN", "IRONPCTSAT" Lipid Panel     Component Value Date/Time  CHOL 124 06/04/2023 1010   TRIG 73 06/04/2023 1010   HDL 51 06/04/2023 1010   LDLCALC 58 06/04/2023 1010   Hepatic Function Panel     Component Value Date/Time   PROT 6.4 06/04/2023 1010   ALBUMIN 4.0 06/04/2023 1010   AST 17 06/04/2023 1010   ALT 16 06/04/2023 1010   ALKPHOS 79 06/04/2023 1010   BILITOT <0.2 06/04/2023 1010      Component Value Date/Time   TSH 2.220 08/06/2022 0943   Nutritional Lab Results  Component Value Date   VD25OH 89.7 06/04/2023   VD25OH 44.3 08/06/2022   VD25OH 47.1 10/18/2021     Assessment and Plan Assessment & Plan Obesity She adheres to a category two eating plan 95% of the  time, resulting in a two-pound weight loss over the past two months. She is attempting to incorporate walking into her routine but lacks a formal exercise regimen. Stress and emotional eating are challenges, though recent changes in her living situation may alleviate stress. She is motivated to continue her weight loss journey and is exploring new recipes to manage her sweet tooth. A dessert recipe using cottage cheese, sugar-free pudding mix, and Fairlife chocolate milk was discussed, providing 150 calories and 15 grams of protein per serving, which may aid satiety and reduce calorie intake from other sources. - Continue category two eating plan. - Encourage regular physical activity. - Refill medications at [facility] in [place]. - Promote meal planning and availability of healthy snacks.  Insulin Resistance She is being treated with metformin and is managing her condition through diet and exercise. She requested a metformin refill. - Refill metformin prescription. - Continue diet and exercise regimen.  Emotional Eating She is treated with Wellbutrin, Lexapro, and topiramate for emotional eating behaviors. She reports no issues with her current medications and is managing stress and sugar cravings. Stress management techniques and the importance of healthy eating habits were discussed to mitigate emotional eating triggers. - Refill Wellbutrin, Lexapro, and topiramate prescriptions. - Encourage stress management techniques and healthy eating habits.  Follow-up She has not scheduled her next appointment and is considering multiple appointments. - Schedule next appointment(s) as needed. - Instruct her to contact the office if issues arise before the next appointment.    She was informed of the importance of frequent follow up visits to maximize her success with intensive lifestyle modifications for her multiple health conditions.    Quillian Quince, MD

## 2024-01-27 ENCOUNTER — Telehealth (INDEPENDENT_AMBULATORY_CARE_PROVIDER_SITE_OTHER): Payer: Self-pay | Admitting: *Deleted

## 2024-01-27 ENCOUNTER — Ambulatory Visit (INDEPENDENT_AMBULATORY_CARE_PROVIDER_SITE_OTHER): Payer: 59 | Admitting: Family Medicine

## 2024-01-27 NOTE — Telephone Encounter (Signed)
 PA SUBMITTED VIA [employer]OVER[employer]S FOR OZEMPIC   Leah Schneider (Key: [id number])  [employer] has not yet replied to your PA request. Depending on the information you've provided, additional questions may be returned by the plan. You may close this dialog, return to your dashboard, and perform other tasks.  To check for an update later, open this request again from your dashboard.  If [employer] has not replied to your request within 24 hours please contact [employer] at [phone].

## 2024-01-28 NOTE — Telephone Encounter (Signed)
 Called customer service in reference to denial for PA. Spoke with CSR, Shanae, she stated medication was denied because the type of plan patient has, will only cover this medication with a dx of T2D.   Patient does not have diabetes and has been notified via Mychart.

## 2024-02-16 ENCOUNTER — Other Ambulatory Visit (INDEPENDENT_AMBULATORY_CARE_PROVIDER_SITE_OTHER): Payer: Self-pay | Admitting: Family Medicine

## 2024-02-16 DIAGNOSIS — F3289 Other specified depressive episodes: Secondary | ICD-10-CM

## 2024-03-03 ENCOUNTER — Ambulatory Visit (INDEPENDENT_AMBULATORY_CARE_PROVIDER_SITE_OTHER): Admitting: Family Medicine

## 2024-03-03 ENCOUNTER — Encounter (INDEPENDENT_AMBULATORY_CARE_PROVIDER_SITE_OTHER): Payer: Self-pay | Admitting: Family Medicine

## 2024-03-03 VITALS — BP 115/77 | HR 84 | Temp 98.3°F | Ht 65.0 in | Wt 194.0 lb

## 2024-03-03 DIAGNOSIS — E669 Obesity, unspecified: Secondary | ICD-10-CM

## 2024-03-03 DIAGNOSIS — E88819 Insulin resistance, unspecified: Secondary | ICD-10-CM

## 2024-03-03 DIAGNOSIS — F5089 Other specified eating disorder: Secondary | ICD-10-CM | POA: Diagnosis not present

## 2024-03-03 DIAGNOSIS — Z6832 Body mass index (BMI) 32.0-32.9, adult: Secondary | ICD-10-CM

## 2024-03-03 DIAGNOSIS — F3289 Other specified depressive episodes: Secondary | ICD-10-CM

## 2024-03-03 MED ORDER — TOPIRAMATE 50 MG PO TABS: 50.0000 mg | ORAL_TABLET | Freq: Two times a day (BID) | ORAL | 0 refills | Status: DC

## 2024-03-03 MED ORDER — ESCITALOPRAM OXALATE 20 MG PO TABS: 20.0000 mg | ORAL_TABLET | Freq: Every day | ORAL | 0 refills | Status: DC

## 2024-03-03 MED ORDER — METFORMIN HCL 500 MG PO TABS: ORAL_TABLET | ORAL | 0 refills | Status: DC

## 2024-03-03 MED ORDER — BUPROPION HCL ER (SR) 200 MG PO TB12: 200.0000 mg | ORAL_TABLET | Freq: Two times a day (BID) | ORAL | 0 refills | Status: DC

## 2024-03-03 NOTE — Progress Notes (Signed)
 Office: [phone]  /  Fax: [phone]  WEIGHT SUMMARY AND BIOMETRICS  Anthropometric Measurements Height: 5\' 5"  (1.651 m) Weight: 194 lb (88 kg) BMI (Calculated): 32.28 Weight at Last Visit: 204 lb Weight Lost Since Last Visit: 10 lb Weight Gained Since Last Visit: 0 Starting Weight: 241 lb Total Weight Loss (lbs): 51 lb (23.1 kg)   Body Composition  Body Fat %: 38.7 % Fat Mass (lbs): 75.2 lbs Muscle Mass (lbs): 113 lbs Total Body Water (lbs): 77.4 lbs Visceral Fat Rating : 8   Other Clinical Data Fasting: No Labs: No Today's Visit #: 23 Starting Date: 05/08/21    Chief Complaint: OBESITY   History of Present Illness Leah Schneider is a 38 year old female who presents for a follow-up on weight management and medication refills.  She has been experiencing a decreased appetite and is actively monitoring her nutrition using the Weight Watchers app to ensure adequate protein intake. Her efforts are described as 'hit and miss', but she is committed to improving her dietary habits.  She is attempting to incorporate more physical activity into her routine, although she finds it challenging due to her schedule. She tries to get up early for exercise and plans to start afternoon workouts now that softball has ended. 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  She has lost ten pounds, with six pounds attributed to fat loss and four pounds to water loss. She is pleased with the decrease in visceral fat.  Her sleep pattern is inconsistent; she sometimes feels tired in the mornings and sluggish by the end of the day. She typically falls asleep around 10 PM and wakes up at 4:30 AM for work, although she occasionally oversleeps until 6 AM.  She acknowledges some control over her cravings and emotional eating, noting a decrease in sweet cravings. She has been trying healthier alternatives like a Weight Watchers recipe with [place] yogurt and cinnamon crunch cereal, and homemade  cucumber lemonade.  She is currently taking metformin , Wellbutrin , Lexapro , and topiramate , and she requests refills for these medications.      PHYSICAL EXAM:  Blood pressure 115/77, pulse 84, temperature 98.3 F (36.8 C), height 5\' 5"  (1.651 m), weight 194 lb (88 kg), SpO2 99%. Body mass index is 32.28 kg/m.  DIAGNOSTIC DATA REVIEWED:  BMET    Component Value Date/Time   NA 140 06/04/2023 1010   K 4.7 06/04/2023 1010   CL 104 06/04/2023 1010   CO2 23 06/04/2023 1010   GLUCOSE 72 06/04/2023 1010   BUN 20 06/04/2023 1010   CREATININE 0.91 06/04/2023 1010   CALCIUM  9.2 06/04/2023 1010   GFRNONAA 106 01/20/2017 0851   GFRAA 122 01/20/2017 0851   Lab Results  Component Value Date   HGBA1C 5.0 06/04/2023   HGBA1C 5.3 05/08/2021   Lab Results  Component Value Date   INSULIN  6.0 06/04/2023   INSULIN  21.1 05/08/2021   Lab Results  Component Value Date   TSH 2.220 08/06/2022   CBC    Component Value Date/Time   WBC 7.2 05/08/2021 0914   WBC 7.6 01/31/2020 0626   RBC 5.23 05/08/2021 0914   RBC 4.98 01/31/2020 0626   HGB 15.1 05/08/2021 0914   HCT 45.9 05/08/2021 0914   PLT 243 05/08/2021 0914   MCV 88 05/08/2021 0914   MCH 28.9 05/08/2021 0914   MCH 28.9 01/31/2020 0626   MCHC 32.9 05/08/2021 0914   MCHC 32.5 01/31/2020 0626   RDW 11.8 05/08/2021 0914  Iron Studies No results found for: "IRON", "TIBC", "FERRITIN", "IRONPCTSAT" Lipid Panel     Component Value Date/Time   CHOL 124 06/04/2023 1010   TRIG 73 06/04/2023 1010   HDL 51 06/04/2023 1010   LDLCALC 58 06/04/2023 1010   Hepatic Function Panel     Component Value Date/Time   PROT 6.4 06/04/2023 1010   ALBUMIN 4.0 06/04/2023 1010   AST 17 06/04/2023 1010   ALT 16 06/04/2023 1010   ALKPHOS 79 06/04/2023 1010   BILITOT <0.2 06/04/2023 1010      Component Value Date/Time   TSH 2.220 08/06/2022 0943   Nutritional Lab Results  Component Value Date   VD25OH 89.7 06/04/2023   VD25OH 44.3  08/06/2022   VD25OH 47.1 10/18/2021     Assessment and Plan Assessment & Plan Obesity Obesity is being managed through diet, exercise, and medication. She has lost 10 pounds, with a significant portion being fat loss. Muscle mass has increased slightly, and visceral fat has decreased, indicating positive progress.Sleep, nutrition, and exercise hierarchy discussed, with emphasis on prioritizing sleep for metabolism and overall health. - Continue current diet and exercise regimen. - Prioritize sleep over exercise to maintain metabolism. - Encourage healthy eating habits and use of recipes like [place] yogurt with cinnamon crunch cereal and cucumber lemonade. Insulin  resistance Insulin  resistance is being managed with metformin . She is adhering to dietary recommendations and monitoring intake through Weight Watchers app. - Refill metformin  prescription. - Continue dietary management to improve insulin  sensitivity.  Emotional eating issues Emotional eating is being managed with Wellbutrin , Lexapro , and topiramate . She reports some control over cravings and is using healthier alternatives to manage sweet cravings. - Refill Wellbutrin , Lexapro , and topiramate  prescriptions. - Continue using healthier alternatives for cravings, such as [place] yogurt recipes.        She was informed of the importance of frequent follow up visits to maximize her success with intensive lifestyle modifications for her multiple health conditions.    Jasmine Mesi, MD

## 2024-03-29 ENCOUNTER — Ambulatory Visit (INDEPENDENT_AMBULATORY_CARE_PROVIDER_SITE_OTHER): Admitting: Family Medicine

## 2024-04-21 ENCOUNTER — Telehealth (INDEPENDENT_AMBULATORY_CARE_PROVIDER_SITE_OTHER): Admitting: Family Medicine

## 2024-04-21 VITALS — Ht 65.0 in

## 2024-04-21 DIAGNOSIS — K529 Noninfective gastroenteritis and colitis, unspecified: Secondary | ICD-10-CM | POA: Diagnosis not present

## 2024-04-21 DIAGNOSIS — E88819 Insulin resistance, unspecified: Secondary | ICD-10-CM | POA: Diagnosis not present

## 2024-04-21 DIAGNOSIS — F5089 Other specified eating disorder: Secondary | ICD-10-CM | POA: Diagnosis not present

## 2024-04-21 DIAGNOSIS — E669 Obesity, unspecified: Secondary | ICD-10-CM

## 2024-04-21 DIAGNOSIS — Z6832 Body mass index (BMI) 32.0-32.9, adult: Secondary | ICD-10-CM

## 2024-04-21 DIAGNOSIS — F3289 Other specified depressive episodes: Secondary | ICD-10-CM

## 2024-04-21 MED ORDER — ESCITALOPRAM OXALATE 20 MG PO TABS
20.0000 mg | ORAL_TABLET | Freq: Every day | ORAL | 0 refills | Status: DC
Start: 2024-04-21 — End: 2024-05-19

## 2024-04-21 MED ORDER — TOPIRAMATE 50 MG PO TABS: 50.0000 mg | ORAL_TABLET | Freq: Two times a day (BID) | ORAL | 0 refills | Status: DC

## 2024-04-21 MED ORDER — METFORMIN HCL 500 MG PO TABS: ORAL_TABLET | ORAL | 0 refills | Status: DC

## 2024-04-21 MED ORDER — BUPROPION HCL ER (SR) 200 MG PO TB12: 200.0000 mg | ORAL_TABLET | Freq: Two times a day (BID) | ORAL | 0 refills | Status: DC

## 2024-04-21 NOTE — Progress Notes (Signed)
 Office: [phone]  /  Fax: [phone]  WEIGHT SUMMARY AND BIOMETRICS  Anthropometric Measurements Height: 5' 5 (1.651 m) Starting Weight: 241 lb   No data recorded Other Clinical Data Fasting: No Labs: No Today's Visit #: 24 Starting Date: 05/08/21 Comments: Mychart Video Visit    Chief Complaint: OBESITY  Virtual Visit via A/V Note  I connected with Leah Schneider on 04/21/24 at 12:00 PM EDT by audiovisual telehealth and verified that I am speaking with the correct person using two identifiers.  Location: Patient: home Provider: clinic   I discussed the limitations, risks, security and privacy concerns of performing an evaluation and management service by AV telehealth and the availability of in person appointments. I also discussed with the patient that there may be a patient responsible charge related to this service. The patient expressed understanding and agreed to proceed.    History of Present Illness   Leah Schneider is a 38 year old female who presents with nausea and gastrointestinal symptoms.  She experiences nausea approximately thirty minutes after taking her thyroid  medication, followed by vomiting and diarrhea. These symptoms have caused her to lay down and sleep intermittently throughout the morning.  She has not been around anyone with known gastrointestinal issues, although she works at a [employer] where some individuals have reported stomach discomfort. She considered the possibility of heat exhaustion due to recent hot weather and physical activity.  Her current medications include Lidotran , Lexapro , Taclonex, metformin , and Synthroid  for thyroid  management. She is unsure if she retained her Synthroid  due to the vomiting, as it was taken approximately thirty minutes before the onset of symptoms.  Her weight at home was 195 pounds, slightly higher than her recent clinic weight of 194 pounds. She follows a category two eating plan, which she adheres to  90% of the time, and is concerned about maintaining her weight loss, especially in the context of potentially starting a family in the future.          PHYSICAL EXAM:  Height 5' 5 (1.651 m). Body mass index is 32.28 kg/m.  DIAGNOSTIC DATA REVIEWED:  BMET    Component Value Date/Time   NA 140 06/04/2023 1010   K 4.7 06/04/2023 1010   CL 104 06/04/2023 1010   CO2 23 06/04/2023 1010   GLUCOSE 72 06/04/2023 1010   BUN 20 06/04/2023 1010   CREATININE 0.91 06/04/2023 1010   CALCIUM  9.2 06/04/2023 1010   GFRNONAA 106 01/20/2017 0851   GFRAA 122 01/20/2017 0851   Lab Results  Component Value Date   HGBA1C 5.0 06/04/2023   HGBA1C 5.3 05/08/2021   Lab Results  Component Value Date   INSULIN  6.0 06/04/2023   INSULIN  21.1 05/08/2021   Lab Results  Component Value Date   TSH 2.220 08/06/2022   CBC    Component Value Date/Time   WBC 7.2 05/08/2021 0914   WBC 7.6 01/31/2020 0626   RBC 5.23 05/08/2021 0914   RBC 4.98 01/31/2020 0626   HGB 15.1 05/08/2021 0914   HCT 45.9 05/08/2021 0914   PLT 243 05/08/2021 0914   MCV 88 05/08/2021 0914   MCH 28.9 05/08/2021 0914   MCH 28.9 01/31/2020 0626   MCHC 32.9 05/08/2021 0914   MCHC 32.5 01/31/2020 0626   RDW 11.8 05/08/2021 0914   Iron Studies No results found for: IRON, TIBC, FERRITIN, IRONPCTSAT Lipid Panel     Component Value Date/Time   CHOL 124 06/04/2023 1010   TRIG 73 06/04/2023 1010  HDL 51 06/04/2023 1010   LDLCALC 58 06/04/2023 1010   Hepatic Function Panel     Component Value Date/Time   PROT 6.4 06/04/2023 1010   ALBUMIN 4.0 06/04/2023 1010   AST 17 06/04/2023 1010   ALT 16 06/04/2023 1010   ALKPHOS 79 06/04/2023 1010   BILITOT <0.2 06/04/2023 1010      Component Value Date/Time   TSH 2.220 08/06/2022 0943   Nutritional Lab Results  Component Value Date   VD25OH 89.7 06/04/2023   VD25OH 44.3 08/06/2022   VD25OH 47.1 10/18/2021     Assessment and Plan    Gastroenteritis Acute  onset of nausea, vomiting, and diarrhea following ingestion of thyroid  medication. Differential diagnosis includes food poisoning and viral gastroenteritis. Symptoms may last 24 hours if food poisoning or 2-3 days if viral. No recent exposure to others with similar symptoms, but possible exposure to gastrointestinal pathogens at work. - Advise hydration with small sips of fluids - Instruct to contact primary care if high fever or worsening symptoms develop for further evaluation  Obesity Following a category two eating plan 90% of the time. Current weight is 195 lbs at home, slightly higher than recent clinic weight of 194 lbs. Concerned about maintaining weight loss if planning for pregnancy in the future. Discussed successful weight maintenance strategies during pregnancy. - Continue current dietary plan - Discuss strategies for maintaining weight during potential future pregnancy  Insulin  resistance On metformin  with no current issues reported. 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 - Refill medications as needed and will continue to monitor EEB        She was informed of the importance of frequent follow up visits to maximize her success with intensive lifestyle modifications for her multiple health conditions.    Louann Penton, MD

## 2024-05-19 ENCOUNTER — Encounter (INDEPENDENT_AMBULATORY_CARE_PROVIDER_SITE_OTHER): Payer: Self-pay | Admitting: Family Medicine

## 2024-05-19 ENCOUNTER — Ambulatory Visit (INDEPENDENT_AMBULATORY_CARE_PROVIDER_SITE_OTHER): Admitting: Family Medicine

## 2024-05-19 VITALS — BP 107/71 | HR 77 | Temp 97.7°F | Ht 65.0 in | Wt 185.0 lb

## 2024-05-19 DIAGNOSIS — E669 Obesity, unspecified: Secondary | ICD-10-CM

## 2024-05-19 DIAGNOSIS — Z6830 Body mass index (BMI) 30.0-30.9, adult: Secondary | ICD-10-CM

## 2024-05-19 DIAGNOSIS — F3289 Other specified depressive episodes: Secondary | ICD-10-CM

## 2024-05-19 DIAGNOSIS — E88819 Insulin resistance, unspecified: Secondary | ICD-10-CM

## 2024-05-19 DIAGNOSIS — Z6841 Body Mass Index (BMI) 40.0 and over, adult: Secondary | ICD-10-CM

## 2024-05-19 DIAGNOSIS — F5089 Other specified eating disorder: Secondary | ICD-10-CM

## 2024-05-19 DIAGNOSIS — E039 Hypothyroidism, unspecified: Secondary | ICD-10-CM | POA: Diagnosis not present

## 2024-05-19 MED ORDER — METFORMIN HCL 500 MG PO TABS: ORAL_TABLET | ORAL | 0 refills | Status: DC

## 2024-05-19 MED ORDER — LEVOTHYROXINE SODIUM 50 MCG PO TABS: 50.0000 ug | ORAL_TABLET | Freq: Every day | ORAL | 0 refills | Status: DC

## 2024-05-19 MED ORDER — BUPROPION HCL ER (SR) 200 MG PO TB12: 200.0000 mg | ORAL_TABLET | Freq: Two times a day (BID) | ORAL | 0 refills | Status: DC

## 2024-05-19 MED ORDER — TOPIRAMATE 50 MG PO TABS: 50.0000 mg | ORAL_TABLET | Freq: Two times a day (BID) | ORAL | 0 refills | Status: DC

## 2024-05-19 MED ORDER — ESCITALOPRAM OXALATE 20 MG PO TABS: 20.0000 mg | ORAL_TABLET | Freq: Every day | ORAL | 0 refills | Status: DC

## 2024-05-19 NOTE — Progress Notes (Signed)
 Office: [phone]  /  Fax: [phone]  WEIGHT SUMMARY AND BIOMETRICS  Anthropometric Measurements Height: 5' 5 (1.651 m) Weight: 185 lb (83.9 kg) BMI (Calculated): 30.79 Weight at Last Visit: 194 lb Weight Lost Since Last Visit: 9 lb Weight Gained Since Last Visit: 0 Starting Weight: 241 lb Total Weight Loss (lbs): 56 lb (25.4 kg) Peak Weight: 250 lb   Body Composition  Body Fat %: 38.4 % Fat Mass (lbs): 71.2 lbs Muscle Mass (lbs): 108.6 lbs Total Body Water (lbs): 76.6 lbs Visceral Fat Rating : 8   Other Clinical Data Fasting: yes Labs: no Today's Visit #: 25 Starting Date: 05/08/21    Chief Complaint: OBESITY    History of Present Illness Leah Schneider is a 38 year old female with obesity, insulin  resistance, and hypothyroidism who presents for obesity treatment and progress assessment.  She has been adhering to a category two eating plan 90% of the time, resulting in a weight loss of nine pounds over the past month. Her exercise routine includes walking, swimming, and strengthening exercises for 60 minutes, five times a week. She finds meal planning helpful but struggles with sweet cravings, particularly between lunch and dinner and during her menstrual cycle.  She is currently on metformin  for insulin  resistance and has not reported any issues with her medication regimen. She requests refills today.  For hypothyroidism, she takes her thyroid  medication first thing in the morning and waits an hour before eating. She tries to eat breakfast between 9 and 10 AM after her morning activities and manages occasional hunger upon waking by delaying breakfast.  She is on Lexapro , Wellbutrin , and topiramate  for emotional eating behaviors, with no noted side effects. She focuses on maintaining protein intake to support her exercise regimen and muscle building.  She mentions being restless over the past few nights, possibly due to recent travel, but generally sleeps  well once settled.  She has not had recent lab work, with the last set done about a year ago. She has not eaten this morning, having only taken her thyroid  medication, and is prepared for lab work today.      PHYSICAL EXAM:  Blood pressure 107/71, pulse 77, temperature 97.7 F (36.5 C), height 5' 5 (1.651 m), weight 185 lb (83.9 kg), SpO2 100%. Body mass index is 30.79 kg/m.  DIAGNOSTIC DATA REVIEWED:  BMET    Component Value Date/Time   NA 140 06/04/2023 1010   K 4.7 06/04/2023 1010   CL 104 06/04/2023 1010   CO2 23 06/04/2023 1010   GLUCOSE 72 06/04/2023 1010   BUN 20 06/04/2023 1010   CREATININE 0.91 06/04/2023 1010   CALCIUM  9.2 06/04/2023 1010   GFRNONAA 106 01/20/2017 0851   GFRAA 122 01/20/2017 0851   Lab Results  Component Value Date   HGBA1C 5.0 06/04/2023   HGBA1C 5.3 05/08/2021   Lab Results  Component Value Date   INSULIN  6.0 06/04/2023   INSULIN  21.1 05/08/2021   Lab Results  Component Value Date   TSH 2.220 08/06/2022   CBC    Component Value Date/Time   WBC 7.2 05/08/2021 0914   WBC 7.6 01/31/2020 0626   RBC 5.23 05/08/2021 0914   RBC 4.98 01/31/2020 0626   HGB 15.1 05/08/2021 0914   HCT 45.9 05/08/2021 0914   PLT 243 05/08/2021 0914   MCV 88 05/08/2021 0914   MCH 28.9 05/08/2021 0914   MCH 28.9 01/31/2020 0626   MCHC 32.9 05/08/2021 0914   MCHC 32.5 01/31/2020  0626   RDW 11.8 05/08/2021 0914   Iron Studies No results found for: IRON, TIBC, FERRITIN, IRONPCTSAT Lipid Panel     Component Value Date/Time   CHOL 124 06/04/2023 1010   TRIG 73 06/04/2023 1010   HDL 51 06/04/2023 1010   LDLCALC 58 06/04/2023 1010   Hepatic Function Panel     Component Value Date/Time   PROT 6.4 06/04/2023 1010   ALBUMIN 4.0 06/04/2023 1010   AST 17 06/04/2023 1010   ALT 16 06/04/2023 1010   ALKPHOS 79 06/04/2023 1010   BILITOT <0.2 06/04/2023 1010      Component Value Date/Time   TSH 2.220 08/06/2022 0943   Nutritional Lab Results   Component Value Date   VD25OH 89.7 06/04/2023   VD25OH 44.3 08/06/2022   VD25OH 47.1 10/18/2021     Assessment and Plan Assessment & Plan Obesity She is following a category two eating plan with 90% adherence and engaging in regular physical activity, including walking, swimming, and strengthening exercises for 60 minutes, five times a week. She has successfully lost nine pounds in the last month. Reports sweet cravings, particularly between lunch and dinner, and during her menstrual cycle. Working on meal planning and preparation to manage her diet better. The exercise regimen is contributing to muscle building, which aids in burning more calories at rest. - Continue category two eating plan with focus on meal planning and preparation - Continue regular physical activity regimen  Insulin  Resistance She is being treated with metformin  and is working on diet, exercise, and weight loss to manage this condition. No issues reported with current medication regimen. - Refill metformin  prescription - Continue diet, exercise, and weight loss efforts  Emotional Eating Behaviors She is being treated with Lexapro , Wellbutrin , and topiramate . Reports no side effects from these medications. Sleep has been slightly restless recently, but not attributed to medication changes. - Refill Lexapro , Wellbutrin , and topiramate  prescriptions  Hypothyroidism She is being treated with Synthroid . Takes her medication in the morning and waits an hour before eating. TSH levels will be checked today as part of routine monitoring. She is compliant with medication timing to optimize absorption. - Refill Synthroid  prescription - Check TSH levels  Follow-up She will need to follow up to assess progress and adjust treatment plans as necessary. Labs will be drawn today as she is fasting. - Follow up in four weeks - Complete lab work today     She was informed of the importance of frequent follow up visits to  maximize her success with intensive lifestyle modifications for her multiple health conditions.    Louann Penton, MD

## 2024-05-21 LAB — VITAMIN B12: Vitamin B-12: 1004 pg/mL (ref 232–1245)

## 2024-05-21 LAB — CMP14+EGFR
ALT: 12 IU/L (ref 0–32)
AST: 18 IU/L (ref 0–40)
Albumin: 4.4 g/dL (ref 3.9–4.9)
Alkaline Phosphatase: 105 IU/L (ref 44–121)
BUN/Creatinine Ratio: 19 (ref 9–23)
BUN: 16 mg/dL (ref 6–20)
Bilirubin Total: 0.6 mg/dL (ref 0.0–1.2)
CO2: 21 mmol/L (ref 20–29)
Calcium: 9.7 mg/dL (ref 8.7–10.2)
Chloride: 104 mmol/L (ref 96–106)
Creatinine, Ser: 0.85 mg/dL (ref 0.57–1.00)
Globulin, Total: 2.6 g/dL (ref 1.5–4.5)
Glucose: 76 mg/dL (ref 70–99)
Potassium: 4.4 mmol/L (ref 3.5–5.2)
Sodium: 137 mmol/L (ref 134–144)
Total Protein: 7 g/dL (ref 6.0–8.5)
eGFR: 89 mL/min/1.73 (ref >59)

## 2024-05-21 LAB — CBC WITH DIFFERENTIAL/PLATELET
Basophils Absolute: 0.1 x10E3/uL (ref 0.0–0.2)
Basos: 1 %
EOS (ABSOLUTE): 0.1 x10E3/uL (ref 0.0–0.4)
Eos: 2 %
Hematocrit: 45.4 % (ref 34.0–46.6)
Hemoglobin: 14.9 g/dL (ref 11.1–15.9)
Immature Grans (Abs): 0 x10E3/uL (ref 0.0–0.1)
Immature Granulocytes: 0 %
Lymphocytes Absolute: 1.3 x10E3/uL (ref 0.7–3.1)
Lymphs: 26 %
MCH: 30.3 pg (ref 26.6–33.0)
MCHC: 32.8 g/dL (ref 31.5–35.7)
MCV: 93 fL (ref 79–97)
Monocytes Absolute: 0.4 x10E3/uL (ref 0.1–0.9)
Monocytes: 8 %
Neutrophils Absolute: 3.2 x10E3/uL (ref 1.4–7.0)
Neutrophils: 63 %
Platelets: 265 x10E3/uL (ref 150–450)
RBC: 4.91 x10E6/uL (ref 3.77–5.28)
RDW: 11.7 % (ref 11.7–15.4)
WBC: 5 x10E3/uL (ref 3.4–10.8)

## 2024-05-21 LAB — LIPID PANEL WITH LDL/HDL RATIO
Cholesterol, Total: 132 mg/dL (ref 100–199)
HDL: 53 mg/dL (ref >39)
LDL Chol Calc (NIH): 67 mg/dL (ref 0–99)
LDL/HDL Ratio: 1.3 ratio (ref 0.0–3.2)
Triglycerides: 57 mg/dL (ref 0–149)
VLDL Cholesterol Cal: 12 mg/dL (ref 5–40)

## 2024-05-21 LAB — TSH: TSH: 2.56 u[IU]/mL (ref 0.450–4.500)

## 2024-05-21 LAB — VITAMIN D 25 HYDROXY (VIT D DEFICIENCY, FRACTURES): Vit D, 25-Hydroxy: 82.5 ng/mL (ref 30.0–100.0)

## 2024-05-21 LAB — INSULIN, RANDOM: INSULIN: 2.5 u[IU]/mL — AB (ref 2.6–24.9)

## 2024-05-21 LAB — HEMOGLOBIN A1C
Est. average glucose Bld gHb Est-mCnc: 88 mg/dL
Hgb A1c MFr Bld: 4.7 % — ABNORMAL LOW (ref 4.8–5.6)

## 2024-06-17 ENCOUNTER — Encounter (INDEPENDENT_AMBULATORY_CARE_PROVIDER_SITE_OTHER): Payer: Self-pay | Admitting: Family Medicine

## 2024-06-17 ENCOUNTER — Ambulatory Visit (INDEPENDENT_AMBULATORY_CARE_PROVIDER_SITE_OTHER): Admitting: Family Medicine

## 2024-06-17 VITALS — BP 95/60 | HR 77 | Temp 97.4°F | Ht 65.0 in | Wt 185.0 lb

## 2024-06-17 DIAGNOSIS — Z6841 Body Mass Index (BMI) 40.0 and over, adult: Secondary | ICD-10-CM

## 2024-06-17 DIAGNOSIS — F5089 Other specified eating disorder: Secondary | ICD-10-CM | POA: Diagnosis not present

## 2024-06-17 DIAGNOSIS — E039 Hypothyroidism, unspecified: Secondary | ICD-10-CM

## 2024-06-17 DIAGNOSIS — Z6830 Body mass index (BMI) 30.0-30.9, adult: Secondary | ICD-10-CM

## 2024-06-17 DIAGNOSIS — F32A Depression, unspecified: Secondary | ICD-10-CM | POA: Diagnosis not present

## 2024-06-17 DIAGNOSIS — E88819 Insulin resistance, unspecified: Secondary | ICD-10-CM

## 2024-06-17 DIAGNOSIS — E162 Hypoglycemia, unspecified: Secondary | ICD-10-CM

## 2024-06-17 DIAGNOSIS — F3289 Other specified depressive episodes: Secondary | ICD-10-CM

## 2024-06-17 DIAGNOSIS — E669 Obesity, unspecified: Secondary | ICD-10-CM

## 2024-06-17 MED ORDER — BUPROPION HCL ER (SR) 200 MG PO TB12: 200.0000 mg | ORAL_TABLET | Freq: Two times a day (BID) | ORAL | 0 refills | Status: AC

## 2024-06-17 MED ORDER — ESCITALOPRAM OXALATE 20 MG PO TABS: 20.0000 mg | ORAL_TABLET | Freq: Every day | ORAL | 0 refills | Status: AC

## 2024-06-17 MED ORDER — LEVOTHYROXINE SODIUM 50 MCG PO TABS: 50.0000 ug | ORAL_TABLET | Freq: Every day | ORAL | 0 refills | Status: AC

## 2024-06-17 MED ORDER — TOPIRAMATE 50 MG PO TABS: 50.0000 mg | ORAL_TABLET | Freq: Two times a day (BID) | ORAL | 0 refills | Status: AC

## 2024-06-17 NOTE — Progress Notes (Signed)
 Office: [phone]  /  Fax: [phone]  WEIGHT SUMMARY AND BIOMETRICS  Anthropometric Measurements Height: 5' 5 (1.651 m) Weight: 185 lb (83.9 kg) BMI (Calculated): 30.79 Weight at Last Visit: 185 lb Weight Lost Since Last Visit: 0 Weight Gained Since Last Visit: 0 Starting Weight: 241 lb Total Weight Loss (lbs): 56 lb (25.4 kg) Peak Weight: 250 lb   Body Composition  Body Fat %: 35.8 % Fat Mass (lbs): 66.6 lbs Muscle Mass (lbs): 113.2 lbs Total Body Water (lbs): 78.4 lbs Visceral Fat Rating : 7   Other Clinical Data Fasting: no Labs: no Today's Visit #: 26 Starting Date: 05/08/21 Comments: Cat 2    Chief Complaint: OBESITY   History of Present Illness Leah Schneider is a 38 year old female who presents for obesity treatment and progress assessment.  She follows the prescribed category two eating plan approximately 90% of the time and engages in physical activities such as walking and swimming for 30 minutes, three to four times a week. She has maintained her weight over the past month and has achieved a total weight loss of 56 pounds since starting the program.  She experiences variable hunger levels, sometimes skipping lunch, which leads to increased hunger later in the day. She has occasional sweet cravings but has managed to avoid indulging despite experiencing stress.  Her sleep has been generally good, although stress has occasionally disrupted her sleep, causing her to wake up early. She sometimes feels lightheaded and shaky, particularly over the summer.  She is currently taking levothyroxine , Topamax , Wellbutrin , Lexapro , and metformin  500 mg twice a day. No issues with her medications. She does not take any vitamin D  supplements but spends time in the sun and drinks milk.      PHYSICAL EXAM:  Blood pressure 95/60, pulse 77, temperature (!) 97.4 F (36.3 C), height 5' 5 (1.651 m), weight 185 lb (83.9 kg), SpO2 98%. Body mass index is 30.79  kg/m.  DIAGNOSTIC DATA REVIEWED:  BMET    Component Value Date/Time   NA 137 05/19/2024 0759   K 4.4 05/19/2024 0759   CL 104 05/19/2024 0759   CO2 21 05/19/2024 0759   GLUCOSE 76 05/19/2024 0759   BUN 16 05/19/2024 0759   CREATININE 0.85 05/19/2024 0759   CALCIUM  9.7 05/19/2024 0759   GFRNONAA 106 01/20/2017 0851   GFRAA 122 01/20/2017 0851   Lab Results  Component Value Date   HGBA1C 4.7 (L) 05/19/2024   HGBA1C 5.3 05/08/2021   Lab Results  Component Value Date   INSULIN  2.5 (L) 05/19/2024   INSULIN  21.1 05/08/2021   Lab Results  Component Value Date   TSH 2.560 05/19/2024   CBC    Component Value Date/Time   WBC 5.0 05/19/2024 0759   WBC 7.6 01/31/2020 0626   RBC 4.91 05/19/2024 0759   RBC 4.98 01/31/2020 0626   HGB 14.9 05/19/2024 0759   HCT 45.4 05/19/2024 0759   PLT 265 05/19/2024 0759   MCV 93 05/19/2024 0759   MCH 30.3 05/19/2024 0759   MCH 28.9 01/31/2020 0626   MCHC 32.8 05/19/2024 0759   MCHC 32.5 01/31/2020 0626   RDW 11.7 05/19/2024 0759   Iron Studies No results found for: IRON, TIBC, FERRITIN, IRONPCTSAT Lipid Panel     Component Value Date/Time   CHOL 132 05/19/2024 0759   TRIG 57 05/19/2024 0759   HDL 53 05/19/2024 0759   LDLCALC 67 05/19/2024 0759   Hepatic Function Panel     Component  Value Date/Time   PROT 7.0 05/19/2024 0759   ALBUMIN 4.4 05/19/2024 0759   AST 18 05/19/2024 0759   ALT 12 05/19/2024 0759   ALKPHOS 105 05/19/2024 0759   BILITOT 0.6 05/19/2024 0759      Component Value Date/Time   TSH 2.560 05/19/2024 0759   Nutritional Lab Results  Component Value Date   VD25OH 82.5 05/19/2024   VD25OH 89.7 06/04/2023   VD25OH 44.3 08/06/2022     Assessment and Plan Assessment & Plan Obesity She has maintained her weight over the last month with a total weight loss of 56 pounds since starting the program. She adheres to the category two eating plan 90% of the time and engages in physical activities such  as walking and swimming for 30 minutes, three to four times a week. 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 Hunger is generally well-managed, though she occasionally skips meals, which is discouraged. - Continue category two eating plan. - Encourage regular physical activity, including morning workouts post-lifeguarding. - Advise against skipping meals and emphasize the importance of protein intake.  Hypothyroidism Thyroid  function is well-controlled with TSH levels on target. No recent changes to levothyroxine . - Continue current dose of levothyroxine , refill today  Insulin  resistance with Low blood glucose symptoms Fasting glucose levels are in the 70s, and insulin  levels are lower than expected at 2.5. She experiences symptoms of hypoglycemia, such as lightheadedness and dizziness, likely due to increased activity levels. Metformin  may no longer be necessary given the current glucose and insulin  levels. - Hold metformin  and monitor symptoms. - Reassess hunger and blood sugar symptoms after discontinuation of metformin . - Plan to re-evaluate in the fall.  Vitamin D  level monitoring Vitamin D  levels are in the 80s, which is acceptable. She does not take vitamin D  supplements but may be receiving it through other sources such as protein drinks or diet. - Monitor vitamin D  levels to ensure they remain below 100. - Review dietary sources for potential vitamin D  supplementation.  Depression with Emotional eating behavior She manages stress-related sweet cravings well and has not indulged in emotional eating despite stress. Current medications include Topamax , Wellbutrin , and Lexapro . - Continue current medications: Topamax , Wellbutrin , and Lexapro . - Provide a 100-calorie snack list to assist with managing cravings. - Encourage use of protein pudding recipe as a healthy alternative to satisfy sweet cravings.    She was informed of the importance of frequent follow up  visits to maximize her success with intensive lifestyle modifications for her multiple health conditions.    Louann Penton, MD

## 2024-07-19 ENCOUNTER — Ambulatory Visit (INDEPENDENT_AMBULATORY_CARE_PROVIDER_SITE_OTHER): Admitting: Family Medicine

## 2024-08-24 ENCOUNTER — Ambulatory Visit (INDEPENDENT_AMBULATORY_CARE_PROVIDER_SITE_OTHER): Admitting: Family Medicine

## 2024-08-30 ENCOUNTER — Encounter: Payer: Self-pay | Admitting: Radiology

## 2024-10-08 ENCOUNTER — Other Ambulatory Visit (INDEPENDENT_AMBULATORY_CARE_PROVIDER_SITE_OTHER): Payer: Self-pay | Admitting: Family Medicine

## 2024-10-08 DIAGNOSIS — E039 Hypothyroidism, unspecified: Secondary | ICD-10-CM
